# Patient Record
Sex: Male | Born: 1975 | Hispanic: Yes | Smoking: Former smoker
Health system: Southern US, Community
[De-identification: ages and names within clinical notes are randomized; demographics above are authoritative.]

## PROBLEM LIST (undated history)

## (undated) DIAGNOSIS — E7800 Pure hypercholesterolemia, unspecified: Secondary | ICD-10-CM

## (undated) DIAGNOSIS — I2510 Atherosclerotic heart disease of native coronary artery without angina pectoris: Secondary | ICD-10-CM

## (undated) DIAGNOSIS — I219 Acute myocardial infarction, unspecified: Secondary | ICD-10-CM

## (undated) DIAGNOSIS — E118 Type 2 diabetes mellitus with unspecified complications: Secondary | ICD-10-CM

## (undated) DIAGNOSIS — M609 Myositis, unspecified: Secondary | ICD-10-CM

## (undated) DIAGNOSIS — E785 Hyperlipidemia, unspecified: Secondary | ICD-10-CM

## (undated) DIAGNOSIS — Z9289 Personal history of other medical treatment: Secondary | ICD-10-CM

## (undated) DIAGNOSIS — N200 Calculus of kidney: Secondary | ICD-10-CM

## (undated) DIAGNOSIS — E119 Type 2 diabetes mellitus without complications: Secondary | ICD-10-CM

## (undated) HISTORY — DX: Hyperlipidemia, unspecified: E78.5

## (undated) HISTORY — DX: Myositis, unspecified: M60.9

## (undated) HISTORY — PX: CARDIAC CATHETERIZATION: SHX172

## (undated) HISTORY — PX: APPENDECTOMY: SHX54

## (undated) HISTORY — DX: Acute myocardial infarction, unspecified: I21.9

## (undated) HISTORY — DX: Type 2 diabetes mellitus without complications: E11.9

## (undated) HISTORY — DX: Calculus of kidney: N20.0

---
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
## 1998-08-15 HISTORY — PX: APPENDECTOMY: SHX54

## 2017-04-14 ENCOUNTER — Encounter: Payer: Self-pay | Admitting: Nurse Practitioner

## 2017-04-14 ENCOUNTER — Telehealth: Payer: Self-pay

## 2017-04-14 ENCOUNTER — Ambulatory Visit
Admission: RE | Admit: 2017-04-14 | Discharge: 2017-04-14 | Disposition: A | Discharge status: Home / Self Care | Payer: BLUE CROSS/BLUE SHIELD | Source: Ambulatory Visit | Attending: Nurse Practitioner | Admitting: Nurse Practitioner

## 2017-04-14 ENCOUNTER — Ambulatory Visit (INDEPENDENT_AMBULATORY_CARE_PROVIDER_SITE_OTHER): Payer: BLUE CROSS/BLUE SHIELD | Admitting: Nurse Practitioner

## 2017-04-14 VITALS — BP 129/81 | HR 80 | Temp 98.5°F | Ht 70.0 in | Wt 223.4 lb

## 2017-04-14 DIAGNOSIS — R079 Chest pain, unspecified: Secondary | ICD-10-CM

## 2017-04-14 DIAGNOSIS — R918 Other nonspecific abnormal finding of lung field: Secondary | ICD-10-CM | POA: Diagnosis not present

## 2017-04-14 DIAGNOSIS — Z131 Encounter for screening for diabetes mellitus: Secondary | ICD-10-CM

## 2017-04-14 DIAGNOSIS — R0602 Shortness of breath: Secondary | ICD-10-CM | POA: Diagnosis not present

## 2017-04-14 DIAGNOSIS — R234 Changes in skin texture: Secondary | ICD-10-CM

## 2017-04-14 DIAGNOSIS — L301 Dyshidrosis [pompholyx]: Secondary | ICD-10-CM | POA: Insufficient documentation

## 2017-04-14 LAB — CBC WITH DIFFERENTIAL/PLATELET
Basophils Absolute: 0 cells/uL (ref 0–200)
Basophils Relative: 0 %
Eosinophils Absolute: 0 cells/uL — ABNORMAL LOW (ref 15–500)
Eosinophils Relative: 0 %
HCT: 48.9 % (ref 38.5–50.0)
Hemoglobin: 16.6 g/dL (ref 13.2–17.1)
Lymphocytes Relative: 40 %
Lymphs Abs: 1800 cells/uL (ref 850–3900)
MCH: 29.9 pg (ref 27.0–33.0)
MCHC: 33.9 g/dL (ref 32.0–36.0)
MCV: 88.1 fL (ref 80.0–100.0)
MPV: 10.7 fL (ref 7.5–12.5)
Monocytes Absolute: 270 cells/uL (ref 200–950)
Monocytes Relative: 6 %
Neutro Abs: 2430 cells/uL (ref 1500–7800)
Neutrophils Relative %: 54 %
Platelets: 223 10*3/uL (ref 140–400)
RBC: 5.55 MIL/uL (ref 4.20–5.80)
RDW: 13.4 % (ref 11.0–15.0)
WBC: 4.5 10*3/uL (ref 3.8–10.8)

## 2017-04-14 NOTE — Progress Notes (Signed)
Subjective:    Patient ID: Stephen [name], male    DOB: 02-Sep-1975, 41 y.o.   MRN: [id number]  Stephen [name] is a 41 y.o. male presenting on 04/14/2017 for Establish Care (intermittent chest pain pt unsure if it was indigistion, because he was belching a lot when he developed the chest pain,  SOB with exertion x 2weeks ) and Rash (peeling on the hands x years that worsen over the last 10 mths.)   HPI Establish Care New Provider Pt last seen by PCP more than 20 years ago. Sought care because of chest pains and shortness of breath last a few weeks ago. Optometry: 3-4 years ago Dentist: regularly  Chest Pain Chest pain w/ shortness of breath during exertion occurring with less intensity several times - The last episode of chest pain was 3 weeks ago. Shortness of breath continued w/ several episodes after initial The last episode 2 weeks ago. - Has had burping w/ heartburn in past, but this pain was different.  Sharp pain noted. - Initial onset in Middle of July (approx July 19th) when he had chest pain while at work while walking.  He remembers date r/t multiple significant life stressors in recent weeks. - Since these events, he has noted lower exercise tolerance.  ie. was very tired w/ helping father build a fence.  Has refrained from heavy physical activity and has avoided going to gym because of the shortness of breath. 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  About 5,000 steps per day.  Denies leg swelling (bilat/unilateral), heart racing, palpitations, headache, loss of/changes in speech, loss of consciousness.  Rash Pt notes history over last 5-6 years of peeling skin on 2nd,3rd digits of left hand at fingertips.  Progressively spreading and now over all fingertips.  Will peel, stop and heal, then peel again cyclically.  Also notes "dermatitis" rash on top of arms at elbows only in winter.  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  Uses lotion w/o significant relief.   Past Medical History:  Diagnosis Date  . Kidney stones 2002, 2008   no stone eval   Past Surgical History:  Procedure Laterality Date  . APPENDECTOMY     Social History   Social History  . Marital status: Divorced    Spouse name: N/A  . Number of children: N/A  . Years of education: N/A   Occupational History  . Not on file.   Social History Main Topics  . Smoking status: Former Smoker    Types: Cigarettes    Quit date: 04/15/2015  . Smokeless tobacco: Never Used     Comment: weekend smoker, pt would only smoke bout 1 -2 cig on the weekend.   . Alcohol use 1.8 oz/week    3 Cans of beer per week     Comment: not every week  . Drug use: No  . Sexual activity: Yes     Comment: preventing w/ partner contraception   Other Topics Concern  . Not on file   Social History Narrative  . No narrative on file   Family History  Problem Relation Age of Onset  . Heart disease Mother   . Thyroid disease Mother   . CAD Mother 97  . Hypertension Mother   . Hyperlipidemia Mother   . Chronic Renal Failure Mother   . Cancer Father        pancreatic  s/p Whipple procedure  . Diabetes Father   . Cholecystitis Father   . Pancreatic cancer Father   . Hyperthyroidism Sister   . Lung cancer Paternal Aunt   . Diabetes Paternal Uncle   . Stroke Paternal Uncle   . Multiple sclerosis Sister   . Hyperthyroidism Sister   . Hyperthyroidism Sister   . Diabetes Sister   . Healthy Sister   . Healthy Son   . Healthy Son   . Pancreatic cancer Paternal Uncle   . Vision loss Neg Hx   . Heart attack Neg Hx   . Breast cancer Neg Hx   . Colon cancer Neg Hx   . Prostate cancer Neg Hx    No current outpatient prescriptions on file prior to visit.   No current facility-administered medications on file prior to visit.     Review of Systems  Constitutional: Positive for fatigue.  HENT: Negative.   Eyes:  Negative.   Respiratory: Positive for shortness of breath.   Cardiovascular: Positive for chest pain. Negative for palpitations and leg swelling.  Gastrointestinal: Negative.   Endocrine: Negative.   Genitourinary: Negative.   Musculoskeletal: Negative.   Skin: Positive for rash.  Allergic/Immunologic: Negative.   Neurological: Negative.   Hematological: Negative.   Psychiatric/Behavioral: Negative.    Per HPI unless specifically indicated above      Objective:    BP 129/81 (BP Location: Right Arm, Patient Position: Sitting, Cuff Size: Large)   Pulse 80   Temp 98.5 F (36.9 C) (Oral)   Ht 5\' 10"  (1.778 m)   Wt 223 lb 6.4 oz (101.3 kg)   SpO2 100%   BMI 32.05 kg/m   Wt Readings from Last 3 Encounters:  04/14/17 223 lb 6.4 oz (101.3 kg)    Physical Exam  General - obese, well-appearing, NAD HEENT - Normocephalic, atraumatic Neck - supple, non-tender, no LAD, no thyromegaly, no carotid bruit Heart - RRR, no murmurs heard Lungs - Clear throughout all lobes, no wheezing, crackles, or rhonchi. Normal work of breathing. Extremeties - non-tender, no edema, cap refill < 2 seconds, peripheral pulses intact +2 bilaterally Skin - warm, dry, rash present w/ peeling skin localized to fingertips bilaterally w/ underlying erythema. Neuro - awake, alert, oriented x3, normal gait Psych - Normal mood and affect, normal behavior       Assessment & Plan:   Problem List Items Addressed This Visit      Other   Chest pain - Primary    Pt w/ hx of recurrent chest pain approximately 3 weeks ago that has resolved in last 2 weeks.  No recent medical encounters.  Significant family history of CAD, hypertension, DMT2, thyroid disease.  Consider differential diagnoses: MI, PE, pleuritic chest pain, panic attack.  Plan: 1. Exercise stress test and Echocardiogram evaluate chest pain and shortness of breath. 2. Urgent Cardiology referral. 3. Reviewed emergencies.  Would prefer immediate workup  as inpatient since abnormal EKG, but pt refuses at this time.  Verbalizes understanding of risks of untreated CAD and will call 9-1-1 for EMS transport if has return of symptoms. 4. Chest Xray today. 5. Labs today: CBC, CMP, Lipid, TSH, A1c 6. EKG today w/ NSR wide QRS w/ changes in lead aVf, left axis deviation, inferior infarct age undetermined w/ possible anterior infarct.  No signs of WPW syndrome. 7. Follow up as needed and in 4 weeks.      Relevant Orders   Exercise Tolerance Test   ECHOCARDIOGRAM COMPLETE  Ambulatory referral to Cardiology   EKG 12-Lead   CBC with Differential/Platelet   Comprehensive metabolic panel   Lipid panel   DG Chest 2 View   TSH   Shortness of breath on exertion    See A/P chest pain      Relevant Orders   Exercise Tolerance Test   ECHOCARDIOGRAM COMPLETE   DG Chest 2 View   Peeling skin    Chronic condition characterized by cyclical peeling of skin on palmar surfaces of fingertips.  No scabbing, or cracking.  Consider hyperkeratosis, eczema, candidal infection.  Since also affects arms in winter, likely eczema.  Plan: 1. Apply hydrocortisone cream bid x 14 days.  If no relief, apply Lotrimin ointment bid x 14 days.  2. If still no improvement, will refer to dermatology. 3. Follow up 4 weeks or sooner if needed.       Other Visit Diagnoses    Screening for diabetes mellitus       Pt w/ family history of diabetes.   Relevant Orders   Hemoglobin A1c      No orders of the defined types were placed in this encounter.     Follow up plan: Return in about 4 weeks (around 05/12/2017) for Follow up shortness of breath and chest pain OR sooner if we need.  Cassell Smiles, DNP, AGPCNP-BC Adult Gerontology Primary Care Nurse Practitioner [facility] 04/14/2017, 1:56 PM

## 2017-04-14 NOTE — Assessment & Plan Note (Signed)
Chronic condition characterized by cyclical peeling of skin on palmar surfaces of fingertips.  No scabbing, or cracking.  Consider hyperkeratosis, eczema, candidal infection.  Since also affects arms in winter, likely eczema.  Plan: 1. Apply hydrocortisone cream bid x 14 days.  If no relief, apply Lotrimin ointment bid x 14 days.  2. If still no improvement, will refer to dermatology. 3. Follow up 4 weeks or sooner if needed.

## 2017-04-14 NOTE — Assessment & Plan Note (Addendum)
Pt w/ hx of recurrent chest pain approximately 3 weeks ago that has resolved in last 2 weeks.  No recent medical encounters.  Significant family history of CAD, hypertension, DMT2, thyroid disease.  Consider differential diagnoses: MI, PE, pleuritic chest pain, panic attack.  Plan: 1. Exercise stress test and Echocardiogram evaluate chest pain and shortness of breath. 2. Urgent Cardiology referral. 3. Reviewed emergencies.  Would prefer immediate workup as inpatient since abnormal EKG, but pt refuses at this time.  Verbalizes understanding of risks of untreated CAD and will call 9-1-1 for EMS transport if has return of symptoms. 4. Chest Xray today. 5. Labs today: CBC, CMP, Lipid, TSH, A1c 6. EKG today w/ NSR wide QRS w/ changes in lead aVf, left axis deviation, inferior infarct age undetermined w/ possible anterior infarct.  No signs of WPW syndrome. 7. Follow up as needed and in 4 weeks.

## 2017-04-14 NOTE — Assessment & Plan Note (Signed)
See A/P chest pain

## 2017-04-14 NOTE — Telephone Encounter (Signed)
New patient referral from pcp Cassell Smiles for Chest Patient marked Urgent Status.    Scheduled ordered echo ett and new patient appt with Arida on 11/01  Added to waitlist.

## 2017-04-14 NOTE — Progress Notes (Signed)
I have reviewed this encounter including the documentation in this note and/or discussed this patient with the provider, Cassell Smiles, AGPCNP-BC. I am certifying that I agree with the content of this note as supervising physician.  Nobie Putnam, [facility] 04/14/2017, 2:10 PM

## 2017-04-14 NOTE — Patient Instructions (Addendum)
[place], Thank you for coming in to clinic today.  1. Chest Xray today at [facility]  2. Labs today here.   3. IF ANY chest pain or shortness of breath this weekend.  Call 9-1-1 and go to Emergency Room via EMS.  - You will get a call to make your appointments.  4. For your rash: - Use lotrimin or clotrimazole ointment on your fingers twice daily x 14 days. - If no relief, use hydrocortisone cream twice daily x 14 days. - If still no relief, I will make a dermatology referral.  Call clinic to request.   Please schedule a follow-up appointment with Cassell Smiles, AGNP. Return in about 4 weeks (around 05/12/2017) for Follow up shortness of breath and chest pain OR sooner if we need.  If you have any other questions or concerns, please feel free to call the clinic or send a message through [facility]. You may also schedule an earlier appointment if necessary.  You will receive a survey after today's visit either digitally by e-mail or paper by [employer]. Your experiences and feedback matter to [place].  Please respond so we know how we are doing as we provide care for you.   Cassell Smiles, DNP, AGNP-BC Adult Gerontology Nurse Practitioner [facility]

## 2017-04-15 LAB — COMPREHENSIVE METABOLIC PANEL
ALT: 38 U/L (ref 9–46)
AST: 24 U/L (ref 10–40)
Albumin: 4.8 g/dL (ref 3.6–5.1)
Alkaline Phosphatase: 85 U/L (ref 40–115)
BUN: 13 mg/dL (ref 7–25)
CO2: 21 mmol/L (ref 20–32)
Calcium: 9.8 mg/dL (ref 8.6–10.3)
Chloride: 101 mmol/L (ref 98–110)
Creat: 0.81 mg/dL (ref 0.60–1.35)
Glucose, Bld: 227 mg/dL — ABNORMAL HIGH (ref 65–99)
Potassium: 4.2 mmol/L (ref 3.5–5.3)
Sodium: 138 mmol/L (ref 135–146)
Total Bilirubin: 0.9 mg/dL (ref 0.2–1.2)
Total Protein: 7.4 g/dL (ref 6.1–8.1)

## 2017-04-15 LAB — LIPID PANEL
Cholesterol: 219 mg/dL — ABNORMAL HIGH (ref <200)
HDL: 37 mg/dL — ABNORMAL LOW (ref >40)
LDL Cholesterol: 126 mg/dL — ABNORMAL HIGH (ref <100)
Total CHOL/HDL Ratio: 5.9 Ratio — ABNORMAL HIGH (ref <5.0)
Triglycerides: 278 mg/dL — ABNORMAL HIGH (ref <150)
VLDL: 56 mg/dL — ABNORMAL HIGH (ref <30)

## 2017-04-15 LAB — TSH: TSH: 1.87 mIU/L (ref 0.40–4.50)

## 2017-04-15 LAB — HEMOGLOBIN A1C
Hgb A1c MFr Bld: 10.2 % — ABNORMAL HIGH (ref <5.7)
Mean Plasma Glucose: 246 mg/dL

## 2017-04-18 ENCOUNTER — Other Ambulatory Visit: Payer: Self-pay

## 2017-04-18 ENCOUNTER — Ambulatory Visit (INDEPENDENT_AMBULATORY_CARE_PROVIDER_SITE_OTHER): Payer: BLUE CROSS/BLUE SHIELD

## 2017-04-18 ENCOUNTER — Encounter: Payer: Self-pay | Admitting: Cardiovascular Disease

## 2017-04-18 ENCOUNTER — Other Ambulatory Visit: Payer: Self-pay | Admitting: Nurse Practitioner

## 2017-04-18 ENCOUNTER — Ambulatory Visit (INDEPENDENT_AMBULATORY_CARE_PROVIDER_SITE_OTHER): Payer: BLUE CROSS/BLUE SHIELD | Admitting: Cardiovascular Disease

## 2017-04-18 VITALS — BP 120/80 | HR 78 | Ht 70.0 in | Wt 224.0 lb

## 2017-04-18 DIAGNOSIS — J181 Lobar pneumonia, unspecified organism: Principal | ICD-10-CM

## 2017-04-18 DIAGNOSIS — R079 Chest pain, unspecified: Secondary | ICD-10-CM

## 2017-04-18 DIAGNOSIS — J189 Pneumonia, unspecified organism: Secondary | ICD-10-CM

## 2017-04-18 DIAGNOSIS — E785 Hyperlipidemia, unspecified: Secondary | ICD-10-CM | POA: Diagnosis not present

## 2017-04-18 DIAGNOSIS — R0602 Shortness of breath: Secondary | ICD-10-CM

## 2017-04-18 DIAGNOSIS — R9431 Abnormal electrocardiogram [ECG] [EKG]: Secondary | ICD-10-CM | POA: Diagnosis not present

## 2017-04-18 LAB — ECHOCARDIOGRAM COMPLETE
Height: 70 in
Weight: 3584 oz

## 2017-04-18 MED ORDER — AZITHROMYCIN 250 MG PO TABS: ORAL_TABLET | ORAL | 0 refills | Status: DC

## 2017-04-18 NOTE — Telephone Encounter (Signed)
Stephen [name]. S/w pt who is agreeable to 1:40pm appt today with Dr. Fletcher Anon.

## 2017-04-18 NOTE — Patient Instructions (Addendum)
Medication Instructions:  Your physician recommends that you continue on your current medications as directed. Please refer to the Current Medication list given to you today.   Labwork: none  Testing/Procedures:  We have cancelled your in-office treadmill test.   Your physician has requested that you have a lexiscan myoview. For further information please visit [email or web address]. Please follow instruction sheet, as given.  [place]  Your caregiver has ordered a Stress Test with nuclear imaging. The purpose of this test is to evaluate the blood supply to your heart muscle. This procedure is referred to as a "Non-Invasive Stress Test." This is because other than having an IV started in your vein, nothing is inserted or "invades" your body. Cardiac stress tests are done to find areas of poor blood flow to the heart by determining the extent of coronary artery disease (CAD). Some patients exercise on a treadmill, which naturally increases the blood flow to your heart, while others who are  unable to walk on a treadmill due to physical limitations have a pharmacologic/chemical stress agent called Lexiscan . This medicine will mimic walking on a treadmill by temporarily increasing your coronary blood flow.   Please note: these test may take anywhere between 2-4 hours to complete  PLEASE REPORT TO [facility] AT THE FIRST DESK WILL DIRECT YOU WHERE TO GO  Date of Procedure:___Wednesday, September 5___  Arrival Time for Procedure:___8:15am________  You may take your morning medication with a sip of water.    PLEASE NOTIFY THE OFFICE AT LEAST 75 HOURS IN ADVANCE IF YOU ARE UNABLE TO KEEP YOUR APPOINTMENT.  [phone] AND  PLEASE NOTIFY NUCLEAR MEDICINE AT [facility] AT LEAST 24 HOURS IN ADVANCE IF YOU ARE UNABLE TO KEEP YOUR APPOINTMENT. [phone]  How to prepare for your Myoview test:  1. Do not eat or drink after midnight 2. No caffeine for 24 hours  prior to test 3. No smoking 24 hours prior to test. 4. Your medication may be taken with water.  If your doctor stopped a medication because of this test, do not take that medication. 5. Ladies, please do not wear dresses.  Skirts or pants are appropriate. Please wear a short sleeve shirt. 6. No perfume, cologne or lotion. 7. Wear comfortable walking shoes. No heels!            Follow-Up: Your physician recommends that you schedule a follow-up appointment with Dr. Fletcher Anon after testing is complete.    Any Other Special Instructions Will Be Listed Below (If Applicable).     If you need a refill on your cardiac medications before your next appointment, please call your pharmacy.  Cardiac Nuclear Scan A cardiac nuclear scan is a test that measures blood flow to the heart when a person is resting and when he or she is exercising. The test looks for problems such as:  Not enough blood reaching a portion of the heart.  The heart muscle not working normally.  You may need this test if:  You have heart disease.  You have had abnormal lab results.  You have had heart surgery or angioplasty.  You have chest pain.  You have shortness of breath.  In this test, a radioactive dye (tracer) is injected into your bloodstream. After the tracer has traveled to your heart, an imaging device is used to measure how much of the tracer is absorbed by or distributed to various areas of your heart. This procedure is usually done at a hospital  and takes 2-4 hours. Tell a health care provider about:  Any allergies you have.  All medicines you are taking, including vitamins, herbs, eye drops, creams, and over-the-counter medicines.  Any problems you or family members have had with the use of anesthetic medicines.  Any blood disorders you have.  Any surgeries you have had.  Any medical conditions you have.  Whether you are pregnant or may be pregnant. What are the risks? Generally, this  is a safe procedure. However, problems may occur, including:  Serious chest pain and heart attack. This is only a risk if the stress portion of the test is done.  Rapid heartbeat.  Sensation of warmth in your chest. This usually passes quickly.  What happens before the procedure?  Ask your health care provider about changing or stopping your regular medicines. This is especially important if you are taking diabetes medicines or blood thinners.  Remove your jewelry on the day of the procedure. What happens during the procedure?  An IV tube will be inserted into one of your veins.  Your health care provider will inject a small amount of radioactive tracer through the tube.  You will wait for 20-40 minutes while the tracer travels through your bloodstream.  Your heart activity will be monitored with an electrocardiogram (ECG).  You will lie down on an exam table.  Images of your heart will be taken for about 15-20 minutes.  You may be asked to exercise on a treadmill or stationary bike. While you exercise, your heart's activity will be monitored with an ECG, and your blood pressure will be checked. If you are unable to exercise, you may be given a medicine to increase blood flow to parts of your heart.  When blood flow to your heart has peaked, a tracer will again be injected through the IV tube.  After 20-40 minutes, you will get back on the exam table and have more images taken of your heart.  When the procedure is over, your IV tube will be removed. The procedure may vary among health care providers and hospitals. Depending on the type of tracer used, scans may need to be repeated 3-4 hours later. What happens after the procedure?  Unless your health care provider tells you otherwise, you may return to your normal schedule, including diet, activities, and medicines.  Unless your health care provider tells you otherwise, you may increase your fluid intake. This will help flush  the contrast dye from your body. Drink enough fluid to keep your urine clear or pale yellow.  It is up to you to get your test results. Ask your health care provider, or the department that is doing the test, when your results will be ready. Summary  A cardiac nuclear scan measures the blood flow to the heart when a person is resting and when he or she is exercising.  You may need this test if you are at risk for heart disease.  Tell your health care provider if you are pregnant.  Unless your health care provider tells you otherwise, increase your fluid intake. This will help flush the contrast dye from your body. Drink enough fluid to keep your urine clear or pale yellow. This information is not intended to replace advice given to you by your health care provider. Make sure you discuss any questions you have with your health care provider. Document Released: 08/26/2004 Document Revised: 08/03/2016 Document Reviewed: 07/10/2013 Elsevier Interactive Patient Education  2017 [employer].

## 2017-04-18 NOTE — Progress Notes (Signed)
Cardiology Office Note   Date:  04/18/2017   ID:  Stephen [name], DOB [birth year], MRN [id number]  PCP:  Mikey College, NP  Cardiologist:   Kathlyn Sacramento, MD   Chief Complaint  Patient presents with  . other    Ref by Dr. Merrilyn Puma for chest pain with having an ETT & Echo that is scheduled 1 week from today. Pt. c/o shortness of breath and chest pain.        History of Present Illness: Stephen [name] is a 41 y.o. male who Was referred by Cassell Smiles for evaluation of chest pain. He has no prior cardiac history and was not seen by a physician for more than 20 years up until recently. He presented for evaluation of chest pain. He reports a prolonged episode of chest pain about 4 weeks ago which was described as sharp and heartburn feeling followed by shortness of breath. The episode of chest pain lasted for a few hours. He had a similar episode a few days later and none since then. However, he has experienced worsening exertional dyspnea since that time with fatigue. No orthopnea, PND or leg edema. He does not have any chronic medical conditions.. However, he had routine labs done recently and he was diagnosed with type 2 diabetes with hemoglobin A1c of 10.2. His lipid profile was also elevated. He quit smoking about 25 years ago. He drinks alcohol occasionally on weekends. He does not have family history of premature coronary artery disease. His mother did have CABG in her 47s.    Past Medical History:  Diagnosis Date  . Kidney stones 2002, 2008   no stone eval    Past Surgical History:  Procedure Laterality Date  . APPENDECTOMY       Current Outpatient Prescriptions  Medication Sig Dispense Refill  . azithromycin (ZITHROMAX Z-PAK) 250 MG tablet Take 1 tablet twice today.  Then, take 1 tablet once daily. 6 each 0   No current facility-administered medications for this visit.     Allergies:   Patient has no known allergies.    Social History:  The patient  reports  that he quit smoking about 2 years ago. His smoking use included Cigarettes. He has never used smokeless tobacco. He reports that he drinks about 1.8 oz of alcohol per week . He reports that he does not use drugs.   Family History:  [name] family history includes CAD (age of onset: 36) in his mother; Cancer in his father; Cholecystitis in his father; Chronic Renal Failure in his mother; Diabetes in his father, paternal uncle, and sister; Healthy in his sister, son, and son; Heart disease in his mother; Hyperlipidemia in his mother; Hypertension in his mother; Hyperthyroidism in his sister, sister, and sister; Lung cancer in his paternal aunt; Multiple sclerosis in his sister; Pancreatic cancer in his father and paternal uncle; Stroke in his paternal uncle; Thyroid disease in his mother.    ROS:  Please see the history of present illness.   Otherwise, review of systems are positive for none.   All other systems are reviewed and negative.    PHYSICAL EXAM: VS:  BP 120/80 (BP Location: Right Arm, Patient Position: Sitting, Cuff Size: Normal)   Pulse 78   Ht 5\' 10"  (1.778 m)   Wt 224 lb (101.6 kg)   BMI 32.14 kg/m  , BMI Body mass index is 32.14 kg/m. GEN: Well nourished, well developed, in no acute distress  HEENT: normal  Neck: no  JVD, carotid bruits, or masses Cardiac: RRR; no murmurs, rubs, or gallops,no edema  Respiratory:  clear to auscultation bilaterally, normal work of breathing GI: soft, nontender, nondistended, + BS MS: no deformity or atrophy  Skin: warm and dry, no rash Neuro:  Strength and sensation are intact Psych: euthymic mood, full affect   EKG:  EKG is ordered today. 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 No  Previous surgical procedure? No  Pain with walking? No  Feet/toe relief with dangling? No  Painful, non-healing ulcers? No  Extremities discolored? No      ASSESSMENT AND PLAN:  1.  Chest pain and exertional dyspnea: He had a prolonged episode of chest pain about 4 weeks ago. 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 He was also recently diagnosed with type 2 diabetes with hemoglobin A1c above 10 with elevated LDL. Due to this, I recommend urgent evaluation with a treadmill nuclear stress test. Given his abnormal EKG with left axis deviation and inferior infarct, a treadmill stress test alone is probably not going to be diagnostic.  He is already scheduled for an echocardiogram next week for evaluation of dyspnea.  2. Hyperlipidemia: Recommend treatment with a statin given newly diagnosed diabetes.    Disposition:   FU with me based on test results.   Signed,  Kathlyn Sacramento, MD  04/18/2017 1:47 PM    [facility]

## 2017-04-19 ENCOUNTER — Ambulatory Visit
Admission: RE | Admit: 2017-04-19 | Discharge: 2017-04-19 | Disposition: A | Discharge status: Home / Self Care | Payer: BLUE CROSS/BLUE SHIELD | Source: Ambulatory Visit | Attending: Cardiovascular Disease | Admitting: Cardiovascular Disease

## 2017-04-19 DIAGNOSIS — R9431 Abnormal electrocardiogram [ECG] [EKG]: Secondary | ICD-10-CM | POA: Diagnosis not present

## 2017-04-19 DIAGNOSIS — R079 Chest pain, unspecified: Secondary | ICD-10-CM | POA: Insufficient documentation

## 2017-04-19 LAB — NM MYOCAR MULTI W/SPECT W/WALL MOTION / EF
CHL CUP NUCLEAR SSS: 13
CSEPEDS: 30 s
Estimated workload: 12.5 METS
Exercise duration (min): 10 min
LVDIAVOL: 60 mL (ref 62–150)
LVSYSVOL: 29 mL
NUC STRESS TID: 0.58
Peak HR: 171 {beats}/min
Percent HR: 95 %
Rest HR: 75 {beats}/min
SDS: 12
SRS: 1

## 2017-04-19 MED ORDER — TECHNETIUM TC 99M TETROFOSMIN IV KIT
30.0000 | PACK | Freq: Once | INTRAVENOUS | Status: AC | PRN
  Administered 2017-04-19: 29.38 via INTRAVENOUS

## 2017-04-19 MED ORDER — TECHNETIUM TC 99M TETROFOSMIN IV KIT
12.0000 | PACK | Freq: Once | INTRAVENOUS | Status: AC | PRN
  Administered 2017-04-19: 12.85 via INTRAVENOUS

## 2017-04-20 ENCOUNTER — Other Ambulatory Visit: Payer: Self-pay

## 2017-04-20 ENCOUNTER — Encounter: Payer: Self-pay | Admitting: Nurse Practitioner

## 2017-04-20 ENCOUNTER — Ambulatory Visit (INDEPENDENT_AMBULATORY_CARE_PROVIDER_SITE_OTHER): Payer: BLUE CROSS/BLUE SHIELD | Admitting: Nurse Practitioner

## 2017-04-20 ENCOUNTER — Other Ambulatory Visit
Admission: RE | Admit: 2017-04-20 | Discharge: 2017-04-20 | Disposition: A | Discharge status: Home / Self Care | Payer: BLUE CROSS/BLUE SHIELD | Source: Ambulatory Visit | Attending: Cardiovascular Disease | Admitting: Cardiovascular Disease

## 2017-04-20 ENCOUNTER — Other Ambulatory Visit: Payer: Self-pay | Admitting: Nurse Practitioner

## 2017-04-20 VITALS — BP 112/77 | HR 74 | Temp 98.4°F | Ht 70.0 in | Wt 219.8 lb

## 2017-04-20 DIAGNOSIS — Z01812 Encounter for preprocedural laboratory examination: Secondary | ICD-10-CM | POA: Insufficient documentation

## 2017-04-20 DIAGNOSIS — E1169 Type 2 diabetes mellitus with other specified complication: Secondary | ICD-10-CM | POA: Insufficient documentation

## 2017-04-20 DIAGNOSIS — E1129 Type 2 diabetes mellitus with other diabetic kidney complication: Secondary | ICD-10-CM | POA: Insufficient documentation

## 2017-04-20 DIAGNOSIS — E119 Type 2 diabetes mellitus without complications: Secondary | ICD-10-CM | POA: Diagnosis not present

## 2017-04-20 DIAGNOSIS — R809 Proteinuria, unspecified: Secondary | ICD-10-CM

## 2017-04-20 DIAGNOSIS — E782 Mixed hyperlipidemia: Secondary | ICD-10-CM | POA: Diagnosis not present

## 2017-04-20 LAB — PROTIME-INR
INR: 0.95
Prothrombin Time: 12.6 seconds (ref 11.4–15.2)

## 2017-04-20 LAB — POCT CBG (FASTING - GLUCOSE)-MANUAL ENTRY: Glucose Fasting, POC: 192 mg/dL — AB (ref 70–99)

## 2017-04-20 MED ORDER — CLOPIDOGREL BISULFATE 75 MG PO TABS: 75.0000 mg | ORAL_TABLET | Freq: Every day | ORAL | 3 refills | Status: DC

## 2017-04-20 MED ORDER — ATORVASTATIN CALCIUM 40 MG PO TABS: 40.0000 mg | ORAL_TABLET | Freq: Every day | ORAL | 3 refills | Status: DC

## 2017-04-20 MED ORDER — METFORMIN HCL 500 MG PO TABS: 500.0000 mg | ORAL_TABLET | Freq: Two times a day (BID) | ORAL | 5 refills | Status: DC

## 2017-04-20 MED ORDER — ICOSAPENT ETHYL 1 G PO CAPS: 2.0000 g | ORAL_CAPSULE | Freq: Two times a day (BID) | ORAL | 3 refills | Status: DC

## 2017-04-20 MED ORDER — BLOOD GLUCOSE MONITOR KIT: PACK | 0 refills | Status: DC

## 2017-04-20 MED ORDER — SIMVASTATIN 40 MG PO TABS: 40.0000 mg | ORAL_TABLET | Freq: Every day | ORAL | 5 refills | Status: DC

## 2017-04-20 MED ORDER — ASPIRIN EC 81 MG PO TBEC: 81.0000 mg | DELAYED_RELEASE_TABLET | Freq: Every day | ORAL | 3 refills | Status: AC

## 2017-04-20 NOTE — Progress Notes (Signed)
 Subjective:    Patient ID: Stephen [name], male    DOB: 10/16/1975, 41 y.o.   MRN: [id number]  Stephen [name] is a 41 y.o. male presenting on 04/20/2017 for Diabetes (discuss lab results )   HPI Diabetes Pt presents today for initial management of Type 2 diabetes mellitus. He has not had symptoms and was initially recognized on screening after pt presented to clinic 8/31 w/ history of chest pain and probable heart attack about 3-4 weeks ago.  Cardiology workup ongoing.  He does not have a meter and is not currently on any medications. He has great support from his girlfriend and boss who has diabetes and has had a heart attack.  His boss has already helped pt w/ initial self-management.    He  has symptoms of polydipsia. He denies polyphagia, polyuria, headaches, diaphoresis, shakiness, chills, pain, numbness or tingling in extremities and changes in vision.    He  reports no regular exercise routine. His diet is not currently restricted and is generally high in carbohydrates.  He was eating 50 g carb instant oatmeal for breakfast, but already states he has thrown those away and he will need to make different choices.  Weight trend: stable Pt reports previously weighed 279 lbs, but is now around 220 after diet and exercise.  PREVENTION: Eye exam current (within one year): no Foot exam current (within one year): no Lipid/ASCVD risk reduction - on statin: no - starting today Kidney protection - on ace or arb: no (BP normal)  Recent Labs  04/14/17 1137  HGBA1C 10.2*    Social History  Substance Use Topics  . Smoking status: Former Smoker    Types: Cigarettes    Quit date: 04/15/2015  . Smokeless tobacco: Never Used     Comment: weekend smoker, pt would only smoke bout 1 -2 cig on the weekend.   . Alcohol use 1.8 oz/week    3 Cans of beer per week     Comment: not every week    Review of Systems Per HPI unless specifically indicated above     Objective:    BP 112/77 (BP  Location: Right Arm, Patient Position: Sitting, Cuff Size: Large)   Pulse 74   Temp 98.4 F (36.9 C) (Oral)   Ht 5' 10" (1.778 m)   Wt 219 lb 12.8 oz (99.7 kg)   BMI 31.54 kg/m   Wt Readings from Last 3 Encounters:  04/20/17 219 lb 12.8 oz (99.7 kg)  04/18/17 224 lb (101.6 kg)  04/14/17 223 lb 6.4 oz (101.3 kg)    Physical Exam  General - overweight, well-appearing, NAD HEENT - Normocephalic, atraumatic Neck - supple, non-tender, no LAD, no thyromegaly, no carotid bruit Heart - RRR, no murmurs heard Lungs - Clear throughout all lobes, no wheezing, crackles, or rhonchi. Normal work of breathing. Extremeties - non-tender, no edema, cap refill < 2 seconds, peripheral pulses intact +2 bilaterally Skin - warm, dry Neuro - awake, alert, oriented x3, normal gait Psych - Normal mood and affect, normal behavior.  Asks appropriate questions.  Exhibits adequate to excellent health literacy.     Results for orders placed or performed during the hospital encounter of 04/19/17  NM Myocar Multi W/Spect W/Wall Motion / EF  Result Value Ref Range   Rest HR 75 bpm   Rest BP 117/79 mmHg   Exercise duration (sec) 30 sec   Percent HR 95 %   Exercise duration (min) 10 min   Estimated workload 12.5 METS     Peak HR 171 bpm   Peak BP 138/88 mmHg   SSS 13    SRS 1    SDS 12    TID 0.58    LV sys vol 29 mL   LV dias vol 60 62 - 150 mL      Assessment & Plan:   Problem List Items Addressed This Visit      Endocrine   Type 2 diabetes mellitus without complication, without long-term current use of insulin (HCC) - Primary    New diagnosis not currently treated and largely asymptomatic.  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  Screening A1c 04/14/17: 10.2%  Plan: 1. Diabetes education - referral placed for formal education. Basic information provided today. 2. START home CBG check for fasting am blood sugar. Goal less than 120, higher than 70. - Reviewed step-by-step  teaching in clinic today. - POCT CBG: fasting am 192. 3. Reviewed low glycemic diet.  Reviewed goal carbohydrates 30-45g per meal and 15g per snack for total between 100-120g per day. 4. START metformin 500 mg once daily.  Increase to 500 mg twice daily w/ meals after 2 weeks.  Reviewed common side effects of GI disturbance. 5. Follow up 4 weeks as previously scheduled.      Relevant Medications   blood glucose meter kit and supplies KIT   metFORMIN (GLUCOPHAGE) 500 MG tablet   simvastatin (ZOCOR) 40 MG tablet   Other Relevant Orders   Referral to Nutrition and Diabetes Services   POCT CBG (Fasting - Glucose)     Other   Mixed hyperlipidemia    New diagnosis not currently treated.   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   - Pt w/ new diagnosis DMT2.  Plan: 1. Encouraged improving diet and exercise.  Reviewed diet. Encouraged low glycemic higher omega-3, lower saturated fat. 2. START fish oil - opt for prescription Vascepa 4 g per day for improving triglycerides (w/ DMT2 management), HDL.  Consider reducing to 2g per day or transitioning to OTC fish oil supplement. 3. START simvastatin 40 mg once daily.  Initiate high intensity statin w/ option to increase to simvastatin 80 mg once daily. 4. Follow up 3 months.      Relevant Medications   simvastatin (ZOCOR) 40 MG tablet   Icosapent Ethyl (VASCEPA) 1 g CAPS      Meds ordered this encounter  Medications  . blood glucose meter kit and supplies KIT    Sig: Dispense based on patient and insurance preference. Use up to four times daily as directed. (FOR ICD-9 250.00, 250.01).    Dispense:  1 each    Refill:  0    Order Specific Question:   Number of strips    Answer:   100    Order Specific Question:   Number of lancets    Answer:   100  . metFORMIN (GLUCOPHAGE) 500 MG tablet    Sig: Take 1 tablet (500 mg total) by mouth 2 (two) times daily with a meal.    Dispense:  60  tablet    Refill:  5  . simvastatin (ZOCOR) 40 MG tablet    Sig: Take 1 tablet (40 mg total) by mouth at bedtime.    Dispense:  30 tablet    Refill:  5  . Icosapent Ethyl (VASCEPA) 1 g CAPS    Sig: Take 2 g by mouth 2 (two) times daily.  Dispense:  360 capsule    Refill:  3      Follow up plan: Return in about 4 weeks (around 05/18/2017) for as scheduled for your diabetes.  A total of 50 minutes was spent face-to-face with this patient. Greater than 50% of this time was spent in counseling and coordination of care with the patient regarding diabetes disease, self care (diet, fingerstick CBGs, metformin), hyperlipidemia disease and medications, heart attack diagnostics and upcoming cardiac catheterization.   Lauren Kennedy, DNP, AGPCNP-BC Adult Gerontology Primary Care Nurse Practitioner [facility] 04/20/2017, 8:21 AM 

## 2017-04-20 NOTE — Progress Notes (Signed)
I have reviewed this encounter including the documentation in this note and/or discussed this patient with the provider, Cassell Smiles, AGPCNP-BC. I am certifying that I agree with the content of this note as supervising physician.  Nobie Putnam, [facility] 04/20/2017, 1:44 PM

## 2017-04-20 NOTE — Patient Instructions (Addendum)
Stephen [name], Thank you for coming in to clinic today.  1. For your blood sugar: - Obtain your meter.  If you need instruction, come by the clinic and we can show you. - Check morning fasting blood sugar every day.  - START taking metformin 500mg  tablet once daily for 2 weeks.  Then, increase to one tablet twice daily and continue. - Work on your low glycemic diet. - Diabetes Education - they will call you to schedule. - Opthalmology appointment: yearly retinal exam  Try to limit to 30-45 grams carbohydrates in a single meal or 15 grams for snacks.  2. For your cholesterol: - START simvastatin 40 mg once daily with dinner. - START vascepa 2 g (two tablets) in am for breakfast, and 2 tablets with dinner. - Low glycemic diet - Increase exercise to 30 minutes most days of the week.  Please schedule a follow-up appointment with Stephen [name], AGNP. Return in about 4 weeks (around 05/18/2017) for as scheduled for your diabetes.  If you have any other questions or concerns, please feel free to call the clinic or send a message through [place]. You may also schedule an earlier appointment if necessary.  You will receive a survey after today's visit either digitally by e-mail or paper by [employer]. Your experiences and feedback matter to Stephen [name].  Please respond so we know how we are doing as we provide care for you.   Blood Glucose Monitoring, Adult Monitoring your blood sugar (glucose) helps you manage your diabetes. It also helps you and your health care provider determine how well your diabetes management plan is working. Blood glucose monitoring involves checking your blood glucose as often as directed, and keeping a record (log) of your results over time. Why should I monitor my blood glucose? Checking your blood glucose regularly can:  Help you understand how food, exercise, illnesses, and medicines affect your blood glucose.  Let you know what your blood glucose is at any time. You can quickly  tell if you are having low blood glucose (hypoglycemia) or high blood glucose (hyperglycemia).  Help you and your health care provider adjust your medicines as needed.  When should I check my blood glucose? Follow instructions from your health care provider about how often to check your blood glucose. This may depend on:  The type of diabetes you have.  How well-controlled your diabetes is.  Medicines you are taking.  If you have type 1 diabetes:  Check your blood glucose at least 2 times a day.  Also check your blood glucose: ? Before every insulin injection. ? Before and after exercise. ? Between meals. ? 2 hours after a meal. ? Occasionally between 2:00 a.m. and 3:00 a.m., as directed. ? Before potentially dangerous tasks, like driving or using heavy machinery. ? At bedtime.  You may need to check your blood glucose more often, up to 6-10 times a day: ? If you use an insulin pump. ? If you need multiple daily injections (MDI). ? If your diabetes is not well-controlled. ? If you are ill. ? If you have a history of severe hypoglycemia. ? If you have a history of not knowing when your blood glucose is getting low (hypoglycemia unawareness). If you have type 2 diabetes:  If you take insulin or other diabetes medicines, check your blood glucose at least 2 times a day.  If you are on intensive insulin therapy, check your blood glucose at least 4 times a day. Occasionally, you may also need  to check between 2:00 a.m. and 3:00 a.m., as directed.  Also check your blood glucose: ? Before and after exercise. ? Before potentially dangerous tasks, like driving or using heavy machinery.  You may need to check your blood glucose more often if: ? Your medicine is being adjusted. ? Your diabetes is not well-controlled. ? You are ill. What is a blood glucose log?  A blood glucose log is a record of your blood glucose readings. It helps you and your health care provider: ? Look for  patterns in your blood glucose over time. ? Adjust your diabetes management plan as needed.  Every time you check your blood glucose, write down your result and notes about things that may be affecting your blood glucose, such as your diet and exercise for the day.  Most glucose meters store a record of glucose readings in the meter. Some meters allow you to download your records to a computer. How do I check my blood glucose? Follow these steps to get accurate readings of your blood glucose: Supplies needed   Blood glucose meter.  Test strips for your meter. Each meter has its own strips. You must use the strips that come with your meter.  A needle to prick your finger (lancet). Do not use lancets more than once.  A device that holds the lancet (lancing device).  A journal or log book to write down your results. Procedure  Wash your hands with soap and water.  Prick the side of your finger (not the tip) with the lancet. Use a different finger each time.  Gently rub the finger until a small drop of blood appears.  Follow instructions that come with your meter for inserting the test strip, applying blood to the strip, and using your blood glucose meter.  Write down your result and any notes. Alternative testing sites  Some meters allow you to use areas of your body other than your finger (alternative sites) to test your blood.  If you think you may have hypoglycemia, or if you have hypoglycemia unawareness, do not use alternative sites. Use your finger instead.  Alternative sites may not be as accurate as the fingers, because blood flow is slower in these areas. This means that the result you get may be delayed, and it may be different from the result that you would get from your finger.  The most common alternative sites are: ? Forearm. ? Thigh. ? Palm of the hand. Additional tips  Always keep your supplies with you.  If you have questions or need help, all blood  glucose meters have a 24-hour "hotline" number that you can call. You may also contact your health care provider.  After you use a few boxes of test strips, adjust (calibrate) your blood glucose meter by following instructions that came with your meter. This information is not intended to replace advice given to you by your health care provider. Make sure you discuss any questions you have with your health care provider. Document Released: 08/04/2003 Document Revised: 02/19/2016 Document Reviewed: 01/11/2016 Elsevier Interactive Patient Education  2017 [place], DNP, AGNP-BC Adult Gerontology Nurse Practitioner [facility]

## 2017-04-20 NOTE — Progress Notes (Signed)
STOP simvastatin.  START atorvastatin and plavix as directed by your cardiologist.

## 2017-04-20 NOTE — Assessment & Plan Note (Signed)
New diagnosis not currently treated and largely asymptomatic.  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  Screening A1c 04/14/17: 10.2%  Plan: 1. Diabetes education - referral placed for formal education. Basic information provided today. 2. START home CBG check for fasting am blood sugar. Goal less than 120, higher than 70. - Reviewed step-by-step teaching in clinic today. - POCT CBG: fasting am 192. 3. Reviewed low glycemic diet.  Reviewed goal carbohydrates 30-45g per meal and 15g per snack for total between 100-120g per day. 4. START metformin 500 mg once daily.  Increase to 500 mg twice daily w/ meals after 2 weeks.  Reviewed common side effects of GI disturbance. 5. Follow up 4 weeks as previously scheduled.

## 2017-04-20 NOTE — Assessment & Plan Note (Signed)
New diagnosis not currently treated.   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   - Pt w/ new diagnosis DMT2.  Plan: 1. Encouraged improving diet and exercise.  Reviewed diet. Encouraged low glycemic higher omega-3, lower saturated fat. 2. START fish oil - opt for prescription Vascepa 4 g per day for improving triglycerides (w/ DMT2 management), HDL.  Consider reducing to 2g per day or transitioning to OTC fish oil supplement. 3. START simvastatin 40 mg once daily.  Initiate high intensity statin w/ option to increase to simvastatin 80 mg once daily. 4. Follow up 3 months.

## 2017-04-21 NOTE — Progress Notes (Signed)
Pt was seen in office yesterday (9/6). Informed pt that he would need to start medications aspirin, plavix, atorvastatin per your orders.  I also started him on T2DM treatment w/ metformin, lifestyle modification.  Informed that he had abnormal stress test and was scheduled for Cath on 9/10.  Pt verbalized understanding.

## 2017-04-24 ENCOUNTER — Encounter
Admission: RE | Disposition: A | Discharge status: Home / Self Care | Payer: Self-pay | Source: Ambulatory Visit | Attending: Cardiovascular Disease

## 2017-04-24 ENCOUNTER — Ambulatory Visit
Admission: RE | Admit: 2017-04-24 | Discharge: 2017-04-24 | Disposition: A | Discharge status: Home / Self Care | Payer: BLUE CROSS/BLUE SHIELD | Source: Ambulatory Visit | Attending: Cardiovascular Disease | Admitting: Cardiovascular Disease

## 2017-04-24 ENCOUNTER — Encounter: Payer: Self-pay | Admitting: *Deleted

## 2017-04-24 DIAGNOSIS — Z87891 Personal history of nicotine dependence: Secondary | ICD-10-CM | POA: Diagnosis not present

## 2017-04-24 DIAGNOSIS — R079 Chest pain, unspecified: Secondary | ICD-10-CM | POA: Diagnosis not present

## 2017-04-24 DIAGNOSIS — I2582 Chronic total occlusion of coronary artery: Secondary | ICD-10-CM | POA: Diagnosis not present

## 2017-04-24 DIAGNOSIS — E785 Hyperlipidemia, unspecified: Secondary | ICD-10-CM | POA: Insufficient documentation

## 2017-04-24 DIAGNOSIS — E119 Type 2 diabetes mellitus without complications: Secondary | ICD-10-CM | POA: Insufficient documentation

## 2017-04-24 DIAGNOSIS — R9439 Abnormal result of other cardiovascular function study: Secondary | ICD-10-CM | POA: Diagnosis present

## 2017-04-24 DIAGNOSIS — I2510 Atherosclerotic heart disease of native coronary artery without angina pectoris: Secondary | ICD-10-CM | POA: Insufficient documentation

## 2017-04-24 HISTORY — PX: LEFT HEART CATH AND CORONARY ANGIOGRAPHY: CATH118249

## 2017-04-24 LAB — GLUCOSE, CAPILLARY: GLUCOSE-CAPILLARY: 140 mg/dL — AB (ref 65–99)

## 2017-04-24 SURGERY — LEFT HEART CATH AND CORONARY ANGIOGRAPHY
Anesthesia: Moderate Sedation | Laterality: Left

## 2017-04-24 MED ORDER — FENTANYL CITRATE (PF) 100 MCG/2ML IJ SOLN
INTRAMUSCULAR | Status: AC
  Filled 2017-04-24: qty 2

## 2017-04-24 MED ORDER — ASPIRIN 81 MG PO CHEW
CHEWABLE_TABLET | ORAL | Status: AC
  Filled 2017-04-24: qty 1

## 2017-04-24 MED ORDER — VERAPAMIL HCL 2.5 MG/ML IV SOLN
INTRAVENOUS | Status: AC
  Filled 2017-04-24: qty 2

## 2017-04-24 MED ORDER — SODIUM CHLORIDE 0.9 % IV SOLN: INTRAVENOUS | Status: DC

## 2017-04-24 MED ORDER — SODIUM CHLORIDE 0.9% FLUSH: 3.0000 mL | INTRAVENOUS | Status: DC | PRN

## 2017-04-24 MED ORDER — IOPAMIDOL (ISOVUE-300) INJECTION 61%
INTRAVENOUS | Status: DC | PRN
  Administered 2017-04-24: 70 mL via INTRA_ARTERIAL

## 2017-04-24 MED ORDER — HEPARIN SODIUM (PORCINE) 1000 UNIT/ML IJ SOLN
INTRAMUSCULAR | Status: AC
  Filled 2017-04-24: qty 1

## 2017-04-24 MED ORDER — VERAPAMIL HCL 2.5 MG/ML IV SOLN
INTRAVENOUS | Status: DC | PRN
  Administered 2017-04-24: 2.5 mg via INTRA_ARTERIAL

## 2017-04-24 MED ORDER — FENTANYL CITRATE (PF) 100 MCG/2ML IJ SOLN
INTRAMUSCULAR | Status: DC | PRN
  Administered 2017-04-24: 25 ug via INTRAVENOUS

## 2017-04-24 MED ORDER — LIDOCAINE HCL (PF) 1 % IJ SOLN
INTRAMUSCULAR | Status: AC
  Filled 2017-04-24: qty 30

## 2017-04-24 MED ORDER — SODIUM CHLORIDE 0.9 % IV SOLN
250.0000 mL | INTRAVENOUS | Status: DC | PRN
Start: 2017-04-24 — End: 2017-04-24

## 2017-04-24 MED ORDER — MIDAZOLAM HCL 2 MG/2ML IJ SOLN
INTRAMUSCULAR | Status: DC | PRN
  Administered 2017-04-24: 1 mg via INTRAVENOUS

## 2017-04-24 MED ORDER — SODIUM CHLORIDE 0.9% FLUSH: 3.0000 mL | Freq: Two times a day (BID) | INTRAVENOUS | Status: DC

## 2017-04-24 MED ORDER — LIDOCAINE HCL (PF) 1 % IJ SOLN
INTRAMUSCULAR | Status: DC | PRN
  Administered 2017-04-24: 3 mL via INTRADERMAL

## 2017-04-24 MED ORDER — SODIUM CHLORIDE 0.9 % IV SOLN: 250.0000 mL | INTRAVENOUS | Status: DC | PRN

## 2017-04-24 MED ORDER — SODIUM CHLORIDE 0.9 % IV SOLN
INTRAVENOUS | Status: DC
  Administered 2017-04-24: 11:00:00 via INTRAVENOUS

## 2017-04-24 MED ORDER — ASPIRIN 81 MG PO CHEW
81.0000 mg | CHEWABLE_TABLET | ORAL | Status: AC
  Administered 2017-04-24: 81 mg via ORAL

## 2017-04-24 MED ORDER — MIDAZOLAM HCL 2 MG/2ML IJ SOLN
INTRAMUSCULAR | Status: AC
  Filled 2017-04-24: qty 2

## 2017-04-24 MED ORDER — HEPARIN SODIUM (PORCINE) 1000 UNIT/ML IJ SOLN
INTRAMUSCULAR | Status: DC | PRN
  Administered 2017-04-24: 5000 [IU] via INTRAVENOUS

## 2017-04-24 SURGICAL SUPPLY — 7 items
CATH INFINITI 5 FR JL3.5 (CATHETERS) ×3 IMPLANT
CATH OPTITORQUE JACKY 4.0 5F (CATHETERS) ×3 IMPLANT
DEVICE RAD TR BAND REGULAR (VASCULAR PRODUCTS) ×3 IMPLANT
GLIDESHEATH SLEND SS 6F .021 (SHEATH) ×3 IMPLANT
KIT MANI 3VAL PERCEP (MISCELLANEOUS) ×3 IMPLANT
PACK CARDIAC CATH (CUSTOM PROCEDURE TRAY) ×3 IMPLANT
WIRE ROSEN-J .035X260CM (WIRE) ×3 IMPLANT

## 2017-04-24 NOTE — H&P (View-Only) (Signed)
Cardiology Office Note   Date:  04/18/2017   ID:  Stephen [name], DOB Sep 11, 1975, MRN [id number]  PCP:  Mikey College, NP  Cardiologist:   Kathlyn Sacramento, MD   Chief Complaint  Patient presents with  . other    Ref by Dr. Merrilyn Puma for chest pain with having an ETT & Echo that is scheduled 1 week from today. Pt. c/o shortness of breath and chest pain.        History of Present Illness: Stephen [name] is a 41 y.o. male who Was referred by Cassell Smiles for evaluation of chest pain. He has no prior cardiac history and was not seen by a physician for more than 20 years up until recently. He presented for evaluation of chest pain. He reports a prolonged episode of chest pain about 4 weeks ago which was described as sharp and heartburn feeling followed by shortness of breath. The episode of chest pain lasted for a few hours. He had a similar episode a few days later and none since then. However, he has experienced worsening exertional dyspnea since that time with fatigue. No orthopnea, PND or leg edema. He does not have any chronic medical conditions.. However, he had routine labs done recently and he was diagnosed with type 2 diabetes with hemoglobin A1c of 10.2. His lipid profile was also elevated. He quit smoking about 25 years ago. He drinks alcohol occasionally on weekends. He does not have family history of premature coronary artery disease. His mother did have CABG in her 19s.    Past Medical History:  Diagnosis Date  . Kidney stones 2002, 2008   no stone eval    Past Surgical History:  Procedure Laterality Date  . APPENDECTOMY       Current Outpatient Prescriptions  Medication Sig Dispense Refill  . azithromycin (ZITHROMAX Z-PAK) 250 MG tablet Take 1 tablet twice today.  Then, take 1 tablet once daily. 6 each 0   No current facility-administered medications for this visit.     Allergies:   Patient has no known allergies.    Social History:  The patient  reports  that he quit smoking about 2 years ago. His smoking use included Cigarettes. He has never used smokeless tobacco. He reports that he drinks about 1.8 oz of alcohol per week . He reports that he does not use drugs.   Family History:  [name] family history includes CAD (age of onset: 14) in his mother; Cancer in his father; Cholecystitis in his father; Chronic Renal Failure in his mother; Diabetes in his father, paternal uncle, and sister; Healthy in his sister, son, and son; Heart disease in his mother; Hyperlipidemia in his mother; Hypertension in his mother; Hyperthyroidism in his sister, sister, and sister; Lung cancer in his paternal aunt; Multiple sclerosis in his sister; Pancreatic cancer in his father and paternal uncle; Stroke in his paternal uncle; Thyroid disease in his mother.    ROS:  Please see the history of present illness.   Otherwise, review of systems are positive for none.   All other systems are reviewed and negative.    PHYSICAL EXAM: VS:  BP 120/80 (BP Location: Right Arm, Patient Position: Sitting, Cuff Size: Normal)   Pulse 78   Ht 5\' 10"  (1.778 m)   Wt 224 lb (101.6 kg)   BMI 32.14 kg/m  , BMI Body mass index is 32.14 kg/m. GEN: Well nourished, well developed, in no acute distress  HEENT: normal  Neck: no  JVD, carotid bruits, or masses Cardiac: RRR; no murmurs, rubs, or gallops,no edema  Respiratory:  clear to auscultation bilaterally, normal work of breathing GI: soft, nontender, nondistended, + BS MS: no deformity or atrophy  Skin: warm and dry, no rash Neuro:  Strength and sensation are intact Psych: euthymic mood, full affect   EKG:  EKG is ordered today. 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 No  Previous surgical procedure? No  Pain with walking? No  Feet/toe relief with dangling? No  Painful, non-healing ulcers? No  Extremities discolored? No      ASSESSMENT AND PLAN:  1.  Chest pain and exertional dyspnea: He had a prolonged episode of chest pain about 4 weeks ago. 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 He was also recently diagnosed with type 2 diabetes with hemoglobin A1c above 10 with elevated LDL. Due to this, I recommend urgent evaluation with a treadmill nuclear stress test. Given his abnormal EKG with left axis deviation and inferior infarct, a treadmill stress test alone is probably not going to be diagnostic.  He is already scheduled for an echocardiogram next week for evaluation of dyspnea.  2. Hyperlipidemia: Recommend treatment with a statin given newly diagnosed diabetes.    Disposition:   FU with me based on test results.   Signed,  Kathlyn Sacramento, MD  04/18/2017 1:47 PM    [facility]

## 2017-04-24 NOTE — Interval H&P Note (Signed)
Stephen Lab Visit (complete for each Stephen Lab visit)  Clinical Evaluation Leading to the Procedure:   ACS: No.  Non-ACS:    Anginal Classification: CCS III  Anti-ischemic medical therapy: No Therapy  Non-Invasive Test Results: Stephen-risk stress test findings: cardiac mortality >3%/year  Prior CABG: No previous CABG      History and Physical Interval Note:  04/24/2017 12:33 PM  Stephen [name]  has presented today for surgery, with the diagnosis of Left Heart Stephen   The various methods of treatment have been discussed with the patient and family. After consideration of risks, benefits and other options for treatment, the patient has consented to  Procedure(s): LEFT HEART Stephen AND CORONARY ANGIOGRAPHY (Left) as a surgical intervention .  [name] history has been reviewed, patient examined, no change in status, stable for surgery.  I have reviewed the patient's chart and labs.  Questions were answered to the patient's satisfaction.     Kathlyn Sacramento

## 2017-04-25 ENCOUNTER — Other Ambulatory Visit: Payer: BLUE CROSS/BLUE SHIELD

## 2017-04-25 ENCOUNTER — Other Ambulatory Visit: Payer: Self-pay

## 2017-04-27 ENCOUNTER — Ambulatory Visit: Payer: Self-pay | Admitting: Family Medicine

## 2017-05-04 ENCOUNTER — Encounter: Payer: Self-pay | Admitting: *Deleted

## 2017-05-04 ENCOUNTER — Encounter: Payer: BLUE CROSS/BLUE SHIELD | Attending: Nurse Practitioner | Admitting: *Deleted

## 2017-05-04 VITALS — BP 122/78 | Ht 70.0 in | Wt 214.6 lb

## 2017-05-04 DIAGNOSIS — Z713 Dietary counseling and surveillance: Secondary | ICD-10-CM | POA: Insufficient documentation

## 2017-05-04 DIAGNOSIS — E119 Type 2 diabetes mellitus without complications: Secondary | ICD-10-CM | POA: Diagnosis not present

## 2017-05-04 NOTE — Progress Notes (Signed)
Diabetes Self-Management Education  Visit Type: First/Initial  Appt. Start Time: 1325 Appt. End Time: 3875  05/04/2017  Mr. Stephen [name], identified by name and date of birth, is a 41 y.o. male with a diagnosis of Diabetes: Type 2.   ASSESSMENT  Blood pressure 122/78, height 5\' 10"  (1.778 m), weight 214 lb 9.6 oz (97.3 kg). Body mass index is 30.79 kg/m.      Diabetes Self-Management Education - 05/04/17 1454      Visit Information   Visit Type First/Initial     Initial Visit   Diabetes Type Type 2   Are you currently following a meal plan? Yes   What type of meal plan do you follow? "diabetic"   Are you taking your medications as prescribed? Yes   Date Diagnosed 2 weeks ago     Health Coping   How would you rate your overall health? Good     Psychosocial Assessment   Patient Belief/Attitude about Diabetes Motivated to manage diabetes   Self-care barriers None   Self-management support Doctor's office;Friends   Patient Concerns Nutrition/Meal planning;Glycemic Control;Medication;Monitoring;Healthy Lifestyle;Problem Solving   Special Needs None   Preferred Learning Style Auditory;Visual;Hands on   [place] in progress   How often do you need to have someone help you when you read instructions, pamphlets, or other written materials from your doctor or pharmacy? 1 - Never   What is the last grade level you completed in school? Associate degree     Pre-Education Assessment   Patient understands the diabetes disease and treatment process. Needs Instruction   Patient understands incorporating nutritional management into lifestyle. Needs Instruction   Patient undertands incorporating physical activity into lifestyle. Needs Instruction   Patient understands using medications safely. Needs Instruction   Patient understands monitoring blood glucose, interpreting and using results Needs Review   Patient understands prevention, detection, and treatment of acute  complications. Needs Instruction   Patient understands prevention, detection, and treatment of chronic complications. Needs Instruction   Patient understands how to develop strategies to address psychosocial issues. Needs Instruction   Patient understands how to develop strategies to promote health/change behavior. Needs Instruction     Complications   Last HgB A1C per patient/outside source 10.2 %  04/14/17   How often do you check your blood sugar? 1-2 times/day   Fasting Blood glucose range (mg/dL) 70-129;130-179  FBG's range from 120-149 mg/dL.   Have you had a dilated eye exam in the past 12 months? No   Have you had a dental exam in the past 12 months? Yes   Are you checking your feet? No     Dietary Intake   Breakfast steel cut oats with strawberries and blueberries or [place] vanilla yogurt with fruit or egg and [place] sausage   Lunch grilled chicken salad   Snack (afternoon) protein bar or [place] yogurt   Dinner bought Healthy Choice meals with low sodium; eat chicken, pork, beef with broccolli, green beans, salad   Beverage(s) water, coffee, diet soda     Exercise   Exercise Type Light (walking / raking leaves)  45 minutes on treadmill and 15 minutes light weights   How many days per week to you exercise? 2 will be starting 3 x week   How many minutes per day do you exercise? 60   Total minutes per week of exercise 120     Patient Education   Previous Diabetes Education No   Disease state  Definition of diabetes, type  1 and 2, and the diagnosis of diabetes   Nutrition management  Role of diet in the treatment of diabetes and the relationship between the three main macronutrients and blood glucose level;Reviewed blood glucose goals for pre and post meals and how to evaluate the patients' food intake on their blood glucose level.;Meal timing in regards to the patients' current diabetes medication.   Physical activity and exercise  Role of exercise on diabetes management, blood  pressure control and cardiac health.   Medications Reviewed patients medication for diabetes, action, purpose, timing of dose and side effects.   Monitoring Purpose and frequency of SMBG.;Taught/discussed recording of test results and interpretation of SMBG.;Identified appropriate SMBG and/or A1C goals.   Chronic complications Relationship between chronic complications and blood glucose control;Retinopathy and reason for yearly dilated eye exams   Psychosocial adjustment Identified and addressed patients feelings and concerns about diabetes     Individualized Goals (developed by patient)   Reducing Risk Improve blood sugars Decrease medications Prevent diabetes complications Lead a healthier lifestyle Become more fit     Outcomes   Expected Outcomes Demonstrated interest in learning. Expect positive outcomes      Individualized Plan for Diabetes Self-Management Training:   Learning Objective:  Patient will have a greater understanding of diabetes self-management. Patient education plan is to attend individual and/or group sessions per assessed needs and concerns.   Plan:   Patient Instructions  Check blood sugars 1 x day before breakfast or 2 hrs after one meal every day Bring blood sugar records to the next class Exercise: Continue routine at gym  for   60  minutes  3 days a week if approved by cardiologist Eat 3 meals day, 1-2 snacks a day Space meals 4-6 hours apart Include serving of protein with breakfast Make an eye doctor appointment  Expected Outcomes:  Demonstrated interest in learning. Expect positive outcomes  Education material provided:  General Meal Planning Guidelines Simple Meal Plan  If problems or questions, patient to contact team via:  Johny Drilling, [place], [place], CDE [phone]  Future DSME appointment:  May 08, 2017 for Diabetes Class 1

## 2017-05-04 NOTE — Patient Instructions (Addendum)
Check blood sugars 1 x day before breakfast or 2 hrs after one meal every day Bring blood sugar records to the next class  Exercise: Continue routine at gym  for   60  minutes  3 days a week if approved by cardiologist  Eat 3 meals day, 1-2 snacks a day Space meals 4-6 hours apart Include serving of protein with breakfast  Make an eye doctor appointment  Return for classes on:

## 2017-05-05 ENCOUNTER — Ambulatory Visit (INDEPENDENT_AMBULATORY_CARE_PROVIDER_SITE_OTHER): Payer: BLUE CROSS/BLUE SHIELD | Admitting: Cardiovascular Disease

## 2017-05-05 ENCOUNTER — Other Ambulatory Visit: Payer: Self-pay

## 2017-05-05 ENCOUNTER — Encounter: Payer: Self-pay | Admitting: Cardiovascular Disease

## 2017-05-05 ENCOUNTER — Other Ambulatory Visit
Admission: RE | Admit: 2017-05-05 | Discharge: 2017-05-05 | Disposition: A | Discharge status: Home / Self Care | Payer: BLUE CROSS/BLUE SHIELD | Source: Ambulatory Visit | Attending: Cardiovascular Disease | Admitting: Cardiovascular Disease

## 2017-05-05 VITALS — BP 110/80 | HR 73 | Ht 70.0 in | Wt 214.8 lb

## 2017-05-05 DIAGNOSIS — E785 Hyperlipidemia, unspecified: Secondary | ICD-10-CM | POA: Diagnosis not present

## 2017-05-05 DIAGNOSIS — Z01812 Encounter for preprocedural laboratory examination: Secondary | ICD-10-CM | POA: Insufficient documentation

## 2017-05-05 DIAGNOSIS — I25118 Atherosclerotic heart disease of native coronary artery with other forms of angina pectoris: Secondary | ICD-10-CM

## 2017-05-05 LAB — CBC
HEMATOCRIT: 43.5 % (ref 40.0–52.0)
HEMOGLOBIN: 15.3 g/dL (ref 13.0–18.0)
MCH: 30 pg (ref 26.0–34.0)
MCHC: 35.1 g/dL (ref 32.0–36.0)
MCV: 85.5 fL (ref 80.0–100.0)
Platelets: 217 10*3/uL (ref 150–440)
RBC: 5.09 MIL/uL (ref 4.40–5.90)
RDW: 13 % (ref 11.5–14.5)
WBC: 5.9 10*3/uL (ref 3.8–10.6)

## 2017-05-05 LAB — BASIC METABOLIC PANEL
ANION GAP: 10 (ref 5–15)
BUN: 14 mg/dL (ref 6–20)
CALCIUM: 9.7 mg/dL (ref 8.9–10.3)
CHLORIDE: 101 mmol/L (ref 101–111)
CO2: 26 mmol/L (ref 22–32)
Creatinine, Ser: 0.79 mg/dL (ref 0.61–1.24)
GFR calc non Af Amer: >60 mL/min (ref >60)
Glucose, Bld: 102 mg/dL — ABNORMAL HIGH (ref 65–99)
Potassium: 4 mmol/L (ref 3.5–5.1)
Sodium: 137 mmol/L (ref 135–145)

## 2017-05-05 LAB — PROTIME-INR
INR: 0.92
PROTHROMBIN TIME: 12.3 s (ref 11.4–15.2)

## 2017-05-05 MED ORDER — NITROGLYCERIN 0.4 MG SL SUBL: 0.4000 mg | SUBLINGUAL_TABLET | SUBLINGUAL | 3 refills | Status: DC | PRN

## 2017-05-05 NOTE — Patient Instructions (Addendum)
Medication Instructions:  Your physician recommends that you continue on your current medications as directed. Please refer to the Current Medication list given to you today.   Labwork: BMET, CBC, PT/INR by September 27 at the [facility]  Testing/Procedures: Your physician has requested that you have a PCI.     [facility] [address] [place] [address] Dept: [phone] Loc: [place]  05/05/2017  You are scheduled for a PCI on Monday, October 1 with Dr. Kathlyn Sacramento.  1. Please arrive at the [facility] at 8:30 AM (two hours before your procedure to ensure your preparation). Free valet parking service is available.   Special note: Every effort is made to have your procedure done on time. Please understand that emergencies sometimes delay scheduled procedures.  2. Diet: Do not eat or drink anything after midnight prior to your procedure except sips of water to take medications.  3. Labs: by Sept 27 at the [facility]  4. Medication instructions in preparation for your procedure:  Please stop your metformin 24 hours before and for 48 hours after your procedure. You should take your other medications with a sip of water.   5. Plan for one night stay--bring personal belongings. 6. Bring a current list of your medications and current insurance cards. 7. You MUST have a responsible person to drive you home. 8. Someone MUST be with you the first 24 hours after you arrive home or your discharge will be delayed. 9. Please wear clothes that are easy to get on and off and wear slip-on shoes.  Thank you for allowing [place] to care for you!   -- [facility]   Follow-Up: Your physician recommends that you schedule a follow-up appointment in: one month with Dr. Fletcher Anon.    Any Other Special Instructions Will Be Listed Below (If  Applicable).     If you need a refill on your cardiac medications before your next appointment, please call your pharmacy.

## 2017-05-05 NOTE — Progress Notes (Signed)
Cardiology Office Note   Date:  05/05/2017   ID:  Stephen [name], DOB Dec 14, 1975, MRN [id number]  PCP:  Mikey College, NP  Cardiologist:   Kathlyn Sacramento, MD   Chief Complaint  Patient presents with  . other    Post cardiac cath c/o chest pain last week taking extra Aspirin to help with pain. Meds reviewed verbally with pt.      History of Present Illness: Stephen [name] is a 41 y.o. male who is here today for a follow-up visit regarding coronary artery disease.  He was seen recently for chest pain with no prior cardiac history.  He was recently diagnosed with type 2 diabetes with hemoglobin A1c of 10.2 and is known to have hyperlipidemia. He was noted and EKG to have evidence of prior inferior infarct.  He underwent a nuclear stress test which showed evidence of infarct in the inferior and inferolateral area with significant peri-infarct ischemia and normal ejection fraction. Echocardiogram was normal. I proceeded with cardiac catheterization which showed significant underlying three-vessel coronary artery disease with subacute thrombotic occlusion of the mid to distal right coronary artery with left-to-right collaterals, occluded OM 3 with left to left collaterals, significant stenosis in OM 2 and moderate disease in second diagonal. The LAD was mildly and diffusely diseased. Ejection fraction was normal. Left ventricular end-diastolic pressure was high normal.  Since cardiac catheterization, he reports one episode of prolonged chest pain that lasted for about 15 minutes. He has been trying to gradually increase his physical activities. He improved his lifestyle significantly.   Past Medical History:  Diagnosis Date  . Diabetes mellitus without complication ([place])   . Hyperlipidemia   . Kidney stones 2002, 2008   no stone eval  . Myocardial infarction [facility])     Past Surgical History:  Procedure Laterality Date  . APPENDECTOMY    . LEFT HEART CATH AND CORONARY ANGIOGRAPHY  Left 04/24/2017   Procedure: LEFT HEART CATH AND CORONARY ANGIOGRAPHY;  Surgeon: Wellington Hampshire, MD;  Location: [place] [facility];  Service: Cardiovascular;  Laterality: Left;     Current Outpatient Prescriptions  Medication Sig Dispense Refill  . aspirin EC 81 MG tablet Take 1 tablet (81 mg total) by mouth daily. 90 tablet 3  . atorvastatin (LIPITOR) 40 MG tablet Take 1 tablet (40 mg total) by mouth daily. 90 tablet 3  . blood glucose meter kit and supplies KIT Dispense based on patient and insurance preference. Use up to four times daily as directed. (FOR ICD-9 250.00, 250.01). 1 each 0  . clopidogrel (PLAVIX) 75 MG tablet Take 1 tablet (75 mg total) by mouth daily. 90 tablet 3  . Icosapent Ethyl (VASCEPA) 1 g CAPS Take 2 g by mouth 2 (two) times daily. 360 capsule 3  . Menthol, Topical Analgesic, (ICY HOT EX) Apply 1 application topically 2 (two) times daily as needed (for back pain.).    Marland Kitchen metFORMIN (GLUCOPHAGE) 500 MG tablet Take 1 tablet (500 mg total) by mouth 2 (two) times daily with a meal. 60 tablet 5   No current facility-administered medications for this visit.     Allergies:   Patient has no known allergies.    Social History:  The patient  reports that he quit smoking about 22 years ago. His smoking use included Cigarettes. He has a 0.30 pack-year smoking history. He has never used smokeless tobacco. He reports that he drinks about 1.8 oz of alcohol per week . He reports that he  does not use drugs.   Family History:  [name] family history includes CAD (age of onset: 76) in his mother; Cancer in his father; Cholecystitis in his father; Chronic Renal Failure in his mother; Diabetes in his father, paternal uncle, and sister; Healthy in his sister, son, and son; Heart disease in his mother; Hyperlipidemia in his mother; Hypertension in his mother; Hyperthyroidism in his sister, sister, and sister; Lung cancer in his paternal aunt; Multiple sclerosis in his sister;  Pancreatic cancer in his father and paternal uncle; Stroke in his paternal uncle; Thyroid disease in his mother.    ROS:  Please see the history of present illness.   Otherwise, review of systems are positive for none.   All other systems are reviewed and negative.    PHYSICAL EXAM: VS:  BP 110/80 (BP Location: Left Arm, Patient Position: Sitting, Cuff Size: Normal)   Pulse 73   Ht '5\' 10"'  (1.778 m)   Wt 214 lb 12 oz (97.4 kg)   BMI 30.81 kg/m  , BMI Body mass index is 30.81 kg/m. GEN: Well nourished, well developed, in no acute distress  HEENT: normal  Neck: no JVD, carotid bruits, or masses Cardiac: RRR; no murmurs, rubs, or gallops,no edema  Respiratory:  clear to auscultation bilaterally, normal work of breathing GI: soft, nontender, nondistended, + BS MS: no deformity or atrophy  Skin: warm and dry, no rash Neuro:  Strength and sensation are intact Psych: euthymic mood, full affect Right radial pulse is normal with no hematoma  EKG:  EKG is ordered today. The ekg ordered today demonstrates normal sinus rhythm with left axis deviation . Old inferior infarct.  Recent Labs: 04/14/2017: ALT 38; BUN 13; Creat 0.81; Hemoglobin 16.6; Platelets 223; Potassium 4.2; Sodium 138; TSH 1.87    Lipid Panel    Component Value Date/Time   CHOL 219 (H) 04/14/2017 1137   TRIG 278 (H) 04/14/2017 1137   HDL 37 (L) 04/14/2017 1137   CHOLHDL 5.9 (H) 04/14/2017 1137   VLDL 56 (H) 04/14/2017 1137   LDLCALC 126 (H) 04/14/2017 1137      Wt Readings from Last 3 Encounters:  05/05/17 214 lb 12 oz (97.4 kg)  05/04/17 214 lb 9.6 oz (97.3 kg)  04/24/17 219 lb (99.3 kg)        PAD Screen 04/18/2017  Previous PAD dx? No  Previous surgical procedure? No  Pain with walking? No  Feet/toe relief with dangling? No  Painful, non-healing ulcers? No  Extremities discolored? No      ASSESSMENT AND PLAN:  1.  Coronary artery disease involving native coronary arteries with other forms of  angina: The plan was to treat him medically given the presence of collaterals. However, given his recent episode of chest pain, I think the best option is to proceed with PCI if OM 2 to improve ischemic burden. If he continues to have symptoms after that, RCA CTO PCI can be considered to be done within 3 months to improve the chance of success. Otherwise, continue aggressive medical therapy. I discussed PCI procedure in detail as well as risk and benefit. Continue dual antiplatelet therapy.  I am planning to refer him to cardiac rehabilitation after PCI.  2. Hyperlipidemia: Continue treatment with atorvastatin with a target LDL of less than 70. He will need a follow-up lipid and liver profile.  3. Type 2 diabetes: Likely the culprit for his diffuse coronary artery disease. Recommend a target hemoglobin A1c less than 7 and consider treatment  with an SGLT2 inhibitor given improved cardiovascular events with this class of medications.   Disposition:   FU with me in one month.  Signed,  Kathlyn Sacramento, MD  05/05/2017 3:08 PM    [facility]

## 2017-05-08 ENCOUNTER — Telehealth: Payer: Self-pay | Admitting: *Deleted

## 2017-05-08 NOTE — Telephone Encounter (Signed)
Received call from patient. He reports that he will have to cancel diabetes class for tonight. Patient reports he will having a stent placed next Mon Oct 1 and will need to reschedule class series. He will begin June 05, 2017.

## 2017-05-08 NOTE — Addendum Note (Signed)
Addended by: Britt Bottom on: 05/08/2017 03:57 PM   Modules accepted: Orders

## 2017-05-12 ENCOUNTER — Telehealth: Payer: Self-pay | Admitting: Cardiovascular Disease

## 2017-05-12 ENCOUNTER — Ambulatory Visit: Payer: BLUE CROSS/BLUE SHIELD | Admitting: Nurse Practitioner

## 2017-05-12 NOTE — Telephone Encounter (Signed)
Reviewed PCI instructions with pt who verbalized understanding.  He had no questions at this time.

## 2017-05-15 ENCOUNTER — Other Ambulatory Visit: Payer: Self-pay

## 2017-05-15 ENCOUNTER — Ambulatory Visit
Admission: RE | Admit: 2017-05-15 | Discharge: 2017-05-16 | Disposition: A | Discharge status: Home / Self Care | Payer: BLUE CROSS/BLUE SHIELD | Source: Ambulatory Visit | Attending: Cardiovascular Disease | Admitting: Cardiovascular Disease

## 2017-05-15 ENCOUNTER — Encounter
Admission: RE | Disposition: A | Discharge status: Home / Self Care | Payer: Self-pay | Source: Ambulatory Visit | Attending: Cardiovascular Disease

## 2017-05-15 ENCOUNTER — Encounter: Payer: Self-pay | Admitting: Emergency Medicine

## 2017-05-15 DIAGNOSIS — Z79899 Other long term (current) drug therapy: Secondary | ICD-10-CM | POA: Diagnosis not present

## 2017-05-15 DIAGNOSIS — Z7902 Long term (current) use of antithrombotics/antiplatelets: Secondary | ICD-10-CM | POA: Insufficient documentation

## 2017-05-15 DIAGNOSIS — I25118 Atherosclerotic heart disease of native coronary artery with other forms of angina pectoris: Secondary | ICD-10-CM | POA: Diagnosis not present

## 2017-05-15 DIAGNOSIS — I252 Old myocardial infarction: Secondary | ICD-10-CM | POA: Insufficient documentation

## 2017-05-15 DIAGNOSIS — Z7984 Long term (current) use of oral hypoglycemic drugs: Secondary | ICD-10-CM | POA: Diagnosis not present

## 2017-05-15 DIAGNOSIS — E785 Hyperlipidemia, unspecified: Secondary | ICD-10-CM | POA: Diagnosis not present

## 2017-05-15 DIAGNOSIS — I209 Angina pectoris, unspecified: Secondary | ICD-10-CM | POA: Diagnosis present

## 2017-05-15 DIAGNOSIS — I25119 Atherosclerotic heart disease of native coronary artery with unspecified angina pectoris: Secondary | ICD-10-CM | POA: Diagnosis present

## 2017-05-15 DIAGNOSIS — I2510 Atherosclerotic heart disease of native coronary artery without angina pectoris: Secondary | ICD-10-CM

## 2017-05-15 DIAGNOSIS — E119 Type 2 diabetes mellitus without complications: Secondary | ICD-10-CM | POA: Insufficient documentation

## 2017-05-15 DIAGNOSIS — Z8249 Family history of ischemic heart disease and other diseases of the circulatory system: Secondary | ICD-10-CM | POA: Diagnosis not present

## 2017-05-15 DIAGNOSIS — Z87891 Personal history of nicotine dependence: Secondary | ICD-10-CM | POA: Diagnosis not present

## 2017-05-15 DIAGNOSIS — Z7982 Long term (current) use of aspirin: Secondary | ICD-10-CM | POA: Insufficient documentation

## 2017-05-15 HISTORY — DX: Type 2 diabetes mellitus with unspecified complications: E11.8

## 2017-05-15 HISTORY — DX: Personal history of other medical treatment: Z92.89

## 2017-05-15 HISTORY — PX: CORONARY STENT INTERVENTION: CATH118234

## 2017-05-15 HISTORY — DX: Atherosclerotic heart disease of native coronary artery without angina pectoris: I25.10

## 2017-05-15 LAB — GLUCOSE, CAPILLARY: Glucose-Capillary: 118 mg/dL — ABNORMAL HIGH (ref 65–99)

## 2017-05-15 LAB — POCT ACTIVATED CLOTTING TIME: ACTIVATED CLOTTING TIME: 323 s

## 2017-05-15 SURGERY — CORONARY STENT INTERVENTION
Anesthesia: Moderate Sedation

## 2017-05-15 MED ORDER — FENTANYL CITRATE (PF) 100 MCG/2ML IJ SOLN
INTRAMUSCULAR | Status: DC | PRN
  Administered 2017-05-15: 25 ug via INTRAVENOUS

## 2017-05-15 MED ORDER — FENTANYL CITRATE (PF) 100 MCG/2ML IJ SOLN
INTRAMUSCULAR | Status: AC
  Filled 2017-05-15: qty 2

## 2017-05-15 MED ORDER — ATORVASTATIN CALCIUM 20 MG PO TABS
40.0000 mg | ORAL_TABLET | Freq: Every day | ORAL | Status: DC
  Administered 2017-05-16: 40 mg via ORAL
  Filled 2017-05-15 (×2): qty 2

## 2017-05-15 MED ORDER — METOPROLOL SUCCINATE ER 25 MG PO TB24
25.0000 mg | ORAL_TABLET | Freq: Every day | ORAL | Status: DC
  Administered 2017-05-16: 25 mg via ORAL
  Filled 2017-05-15: qty 1

## 2017-05-15 MED ORDER — CLOPIDOGREL BISULFATE 75 MG PO TABS
ORAL_TABLET | ORAL | Status: AC
  Filled 2017-05-15: qty 3

## 2017-05-15 MED ORDER — MIDAZOLAM HCL 2 MG/2ML IJ SOLN
INTRAMUSCULAR | Status: DC | PRN
  Administered 2017-05-15: 1 mg via INTRAVENOUS

## 2017-05-15 MED ORDER — SODIUM CHLORIDE 0.9% FLUSH: 3.0000 mL | Freq: Two times a day (BID) | INTRAVENOUS | Status: DC

## 2017-05-15 MED ORDER — MIDAZOLAM HCL 2 MG/2ML IJ SOLN
INTRAMUSCULAR | Status: AC
  Filled 2017-05-15: qty 2

## 2017-05-15 MED ORDER — ASPIRIN 81 MG PO CHEW
CHEWABLE_TABLET | ORAL | Status: AC
  Filled 2017-05-15: qty 3

## 2017-05-15 MED ORDER — SODIUM CHLORIDE 0.9% FLUSH
3.0000 mL | Freq: Two times a day (BID) | INTRAVENOUS | Status: DC
  Administered 2017-05-15: 3 mL via INTRAVENOUS

## 2017-05-15 MED ORDER — ACETAMINOPHEN 500 MG PO TABS: 500.0000 mg | ORAL_TABLET | Freq: Four times a day (QID) | ORAL | Status: DC | PRN

## 2017-05-15 MED ORDER — SODIUM CHLORIDE 0.9% FLUSH: 3.0000 mL | INTRAVENOUS | Status: DC | PRN

## 2017-05-15 MED ORDER — HEPARIN SODIUM (PORCINE) 1000 UNIT/ML IJ SOLN
INTRAMUSCULAR | Status: DC | PRN
  Administered 2017-05-15: 9000 [IU] via INTRAVENOUS

## 2017-05-15 MED ORDER — SODIUM CHLORIDE 0.9 % IV SOLN
INTRAVENOUS | Status: DC
  Administered 2017-05-15: 09:00:00 via INTRAVENOUS

## 2017-05-15 MED ORDER — VERAPAMIL HCL 2.5 MG/ML IV SOLN
INTRAVENOUS | Status: AC
  Filled 2017-05-15: qty 2

## 2017-05-15 MED ORDER — OMEGA-3-ACID ETHYL ESTERS 1 G PO CAPS
2.0000 g | ORAL_CAPSULE | Freq: Two times a day (BID) | ORAL | Status: DC
  Administered 2017-05-15 – 2017-05-16 (×2): 2 g via ORAL
  Filled 2017-05-15 (×2): qty 2

## 2017-05-15 MED ORDER — ASPIRIN 81 MG PO CHEW: 81.0000 mg | CHEWABLE_TABLET | ORAL | Status: DC

## 2017-05-15 MED ORDER — SODIUM CHLORIDE 0.9 % IV SOLN: 250.0000 mL | INTRAVENOUS | Status: DC | PRN

## 2017-05-15 MED ORDER — SODIUM CHLORIDE 0.9 % IV SOLN
250.0000 mL | INTRAVENOUS | Status: DC | PRN
Start: 2017-05-15 — End: 2017-05-16

## 2017-05-15 MED ORDER — HEPARIN (PORCINE) IN NACL 2-0.9 UNIT/ML-% IJ SOLN
INTRAMUSCULAR | Status: AC
  Filled 2017-05-15: qty 1000

## 2017-05-15 MED ORDER — HEPARIN SODIUM (PORCINE) 1000 UNIT/ML IJ SOLN
INTRAMUSCULAR | Status: AC
  Filled 2017-05-15: qty 1

## 2017-05-15 MED ORDER — IOPAMIDOL (ISOVUE-300) INJECTION 61%
INTRAVENOUS | Status: DC | PRN
  Administered 2017-05-15: 90 mL via INTRA_ARTERIAL

## 2017-05-15 MED ORDER — VERAPAMIL HCL 2.5 MG/ML IV SOLN
INTRAVENOUS | Status: DC | PRN
  Administered 2017-05-15: 2.5 mg via INTRA_ARTERIAL

## 2017-05-15 MED ORDER — ICOSAPENT ETHYL 1 G PO CAPS: 2.0000 g | ORAL_CAPSULE | Freq: Two times a day (BID) | ORAL | Status: DC

## 2017-05-15 MED ORDER — NITROGLYCERIN 5 MG/ML IV SOLN
INTRAVENOUS | Status: AC
  Filled 2017-05-15: qty 10

## 2017-05-15 MED ORDER — ONDANSETRON HCL 4 MG/2ML IJ SOLN: 4.0000 mg | Freq: Four times a day (QID) | INTRAMUSCULAR | Status: DC | PRN

## 2017-05-15 MED ORDER — LIDOCAINE HCL (PF) 1 % IJ SOLN
INTRAMUSCULAR | Status: AC
  Filled 2017-05-15: qty 30

## 2017-05-15 MED ORDER — ASPIRIN EC 81 MG PO TBEC
81.0000 mg | DELAYED_RELEASE_TABLET | Freq: Every day | ORAL | Status: DC
  Administered 2017-05-16: 81 mg via ORAL
  Filled 2017-05-15 (×2): qty 1

## 2017-05-15 MED ORDER — CLOPIDOGREL BISULFATE 75 MG PO TABS
ORAL_TABLET | ORAL | Status: DC | PRN
  Administered 2017-05-15: 225 mg via ORAL

## 2017-05-15 MED ORDER — NITROGLYCERIN 0.4 MG SL SUBL: 0.4000 mg | SUBLINGUAL_TABLET | SUBLINGUAL | Status: DC | PRN

## 2017-05-15 MED ORDER — SODIUM CHLORIDE 0.9 % WEIGHT BASED INFUSION: 1.0000 mL/kg/h | INTRAVENOUS | Status: AC

## 2017-05-15 MED ORDER — NITROGLYCERIN 1 MG/10 ML FOR IR/CATH LAB
INTRA_ARTERIAL | Status: DC | PRN
  Administered 2017-05-15: 100 ug via INTRACORONARY

## 2017-05-15 MED ORDER — CLOPIDOGREL BISULFATE 75 MG PO TABS
75.0000 mg | ORAL_TABLET | Freq: Every day | ORAL | Status: DC
  Administered 2017-05-16: 75 mg via ORAL
  Filled 2017-05-15: qty 1

## 2017-05-15 SURGICAL SUPPLY — 12 items
BALLN MINITREK RX 2.0X12 (BALLOONS) ×3
BALLN ~~LOC~~ TREK RX 3.0X20 (BALLOONS) ×3
BALLOON MINITREK RX 2.0X12 (BALLOONS) ×1 IMPLANT
BALLOON ~~LOC~~ TREK RX 3.0X20 (BALLOONS) ×1 IMPLANT
CATH VISTA GUIDE 6FR XB3 (CATHETERS) ×3 IMPLANT
DEVICE INFLAT 30 PLUS (MISCELLANEOUS) ×3 IMPLANT
DEVICE RAD COMP TR BAND LRG (VASCULAR PRODUCTS) ×3 IMPLANT
GLIDESHEATH SLEND SS 6F .021 (SHEATH) ×3 IMPLANT
KIT MANI 3VAL PERCEP (MISCELLANEOUS) ×3 IMPLANT
PACK CARDIAC CATH (CUSTOM PROCEDURE TRAY) ×3 IMPLANT
STENT RESOLUTE ONYX 2.75X26 (Permanent Stent) ×3 IMPLANT
WIRE ROSEN-J .035X260CM (WIRE) ×3 IMPLANT

## 2017-05-15 NOTE — Interval H&P Note (Signed)
History and Physical Interval Note:  05/15/2017 9:31 AM  [name]  has presented today for surgery, with the diagnosis of PCI   LT circumference   The various methods of treatment have been discussed with the patient and family. After consideration of risks, benefits and other options for treatment, the patient has consented to  Procedure(s): CORONARY STENT INTERVENTION (N/A) as a surgical intervention .  [name] history has been reviewed, patient examined, no change in status, stable for surgery.  I have reviewed the patient's chart and labs.  Questions were answered to the patient's satisfaction.     Stephen [name]

## 2017-05-15 NOTE — Progress Notes (Signed)
TR band completely removed. No drainage, hematoma, edema, erythema, ecchymosis. BA, sterile 2x2, then tegaderm to site. Pt. Tolerated well. No acute distress.

## 2017-05-15 NOTE — H&P (View-Only) (Signed)
Cardiology Office Note   Date:  05/05/2017   ID:  Stephen [name], DOB 1976/05/20, MRN [id number]  PCP:  Mikey College, NP  Cardiologist:   Kathlyn Sacramento, MD   Chief Complaint  Patient presents with  . other    Post cardiac cath c/o chest pain last week taking extra Aspirin to help with pain. Meds reviewed verbally with pt.      History of Present Illness: Stephen [name] is a 41 y.o. male who is here today for a follow-up visit regarding coronary artery disease.  He was seen recently for chest pain with no prior cardiac history.  He was recently diagnosed with type 2 diabetes with hemoglobin A1c of 10.2 and is known to have hyperlipidemia. He was noted and EKG to have evidence of prior inferior infarct.  He underwent a nuclear stress test which showed evidence of infarct in the inferior and inferolateral area with significant peri-infarct ischemia and normal ejection fraction. Echocardiogram was normal. I proceeded with cardiac catheterization which showed significant underlying three-vessel coronary artery disease with subacute thrombotic occlusion of the mid to distal right coronary artery with left-to-right collaterals, occluded OM 3 with left to left collaterals, significant stenosis in OM 2 and moderate disease in second diagonal. The LAD was mildly and diffusely diseased. Ejection fraction was normal. Left ventricular end-diastolic pressure was high normal.  Since cardiac catheterization, he reports one episode of prolonged chest pain that lasted for about 15 minutes. He has been trying to gradually increase his physical activities. He improved his lifestyle significantly.   Past Medical History:  Diagnosis Date  . Diabetes mellitus without complication ([place])   . Hyperlipidemia   . Kidney stones 2002, 2008   no stone eval  . Myocardial infarction [facility])     Past Surgical History:  Procedure Laterality Date  . APPENDECTOMY    . LEFT HEART CATH AND CORONARY ANGIOGRAPHY  Left 04/24/2017   Procedure: LEFT HEART CATH AND CORONARY ANGIOGRAPHY;  Surgeon: Wellington Hampshire, MD;  Location: [place] [facility];  Service: Cardiovascular;  Laterality: Left;     Current Outpatient Prescriptions  Medication Sig Dispense Refill  . aspirin EC 81 MG tablet Take 1 tablet (81 mg total) by mouth daily. 90 tablet 3  . atorvastatin (LIPITOR) 40 MG tablet Take 1 tablet (40 mg total) by mouth daily. 90 tablet 3  . blood glucose meter kit and supplies KIT Dispense based on patient and insurance preference. Use up to four times daily as directed. (FOR ICD-9 250.00, 250.01). 1 each 0  . clopidogrel (PLAVIX) 75 MG tablet Take 1 tablet (75 mg total) by mouth daily. 90 tablet 3  . Icosapent Ethyl (VASCEPA) 1 g CAPS Take 2 g by mouth 2 (two) times daily. 360 capsule 3  . Menthol, Topical Analgesic, (ICY HOT EX) Apply 1 application topically 2 (two) times daily as needed (for back pain.).    Marland Kitchen metFORMIN (GLUCOPHAGE) 500 MG tablet Take 1 tablet (500 mg total) by mouth 2 (two) times daily with a meal. 60 tablet 5   No current facility-administered medications for this visit.     Allergies:   Patient has no known allergies.    Social History:  The patient  reports that he quit smoking about 22 years ago. His smoking use included Cigarettes. He has a 0.30 pack-year smoking history. He has never used smokeless tobacco. He reports that he drinks about 1.8 oz of alcohol per week . He reports that he  does not use drugs.   Family History:  [name] family history includes CAD (age of onset: 7) in his mother; Cancer in his father; Cholecystitis in his father; Chronic Renal Failure in his mother; Diabetes in his father, paternal uncle, and sister; Healthy in his sister, son, and son; Heart disease in his mother; Hyperlipidemia in his mother; Hypertension in his mother; Hyperthyroidism in his sister, sister, and sister; Lung cancer in his paternal aunt; Multiple sclerosis in his sister;  Pancreatic cancer in his father and paternal uncle; Stroke in his paternal uncle; Thyroid disease in his mother.    ROS:  Please see the history of present illness.   Otherwise, review of systems are positive for none.   All other systems are reviewed and negative.    PHYSICAL EXAM: VS:  BP 110/80 (BP Location: Left Arm, Patient Position: Sitting, Cuff Size: Normal)   Pulse 73   Ht '5\' 10"'  (1.778 m)   Wt 214 lb 12 oz (97.4 kg)   BMI 30.81 kg/m  , BMI Body mass index is 30.81 kg/m. GEN: Well nourished, well developed, in no acute distress  HEENT: normal  Neck: no JVD, carotid bruits, or masses Cardiac: RRR; no murmurs, rubs, or gallops,no edema  Respiratory:  clear to auscultation bilaterally, normal work of breathing GI: soft, nontender, nondistended, + BS MS: no deformity or atrophy  Skin: warm and dry, no rash Neuro:  Strength and sensation are intact Psych: euthymic mood, full affect Right radial pulse is normal with no hematoma  EKG:  EKG is ordered today. The ekg ordered today demonstrates normal sinus rhythm with left axis deviation . Old inferior infarct.  Recent Labs: 04/14/2017: ALT 38; BUN 13; Creat 0.81; Hemoglobin 16.6; Platelets 223; Potassium 4.2; Sodium 138; TSH 1.87    Lipid Panel    Component Value Date/Time   CHOL 219 (H) 04/14/2017 1137   TRIG 278 (H) 04/14/2017 1137   HDL 37 (L) 04/14/2017 1137   CHOLHDL 5.9 (H) 04/14/2017 1137   VLDL 56 (H) 04/14/2017 1137   LDLCALC 126 (H) 04/14/2017 1137      Wt Readings from Last 3 Encounters:  05/05/17 214 lb 12 oz (97.4 kg)  05/04/17 214 lb 9.6 oz (97.3 kg)  04/24/17 219 lb (99.3 kg)        PAD Screen 04/18/2017  Previous PAD dx? No  Previous surgical procedure? No  Pain with walking? No  Feet/toe relief with dangling? No  Painful, non-healing ulcers? No  Extremities discolored? No      ASSESSMENT AND PLAN:  1.  Coronary artery disease involving native coronary arteries with other forms of  angina: The plan was to treat him medically given the presence of collaterals. However, given his recent episode of chest pain, I think the best option is to proceed with PCI if OM 2 to improve ischemic burden. If he continues to have symptoms after that, RCA CTO PCI can be considered to be done within 3 months to improve the chance of success. Otherwise, continue aggressive medical therapy. I discussed PCI procedure in detail as well as risk and benefit. Continue dual antiplatelet therapy.  I am planning to refer him to cardiac rehabilitation after PCI.  2. Hyperlipidemia: Continue treatment with atorvastatin with a target LDL of less than 70. He will need a follow-up lipid and liver profile.  3. Type 2 diabetes: Likely the culprit for his diffuse coronary artery disease. Recommend a target hemoglobin A1c less than 7 and consider treatment  with an SGLT2 inhibitor given improved cardiovascular events with this class of medications.   Disposition:   FU with me in one month.  Signed,  Kathlyn Sacramento, MD  05/05/2017 3:08 PM    [facility]

## 2017-05-16 DIAGNOSIS — I209 Angina pectoris, unspecified: Secondary | ICD-10-CM | POA: Diagnosis not present

## 2017-05-16 DIAGNOSIS — I25119 Atherosclerotic heart disease of native coronary artery with unspecified angina pectoris: Secondary | ICD-10-CM | POA: Diagnosis not present

## 2017-05-16 LAB — BASIC METABOLIC PANEL
ANION GAP: 8 (ref 5–15)
BUN: 13 mg/dL (ref 6–20)
CALCIUM: 9.3 mg/dL (ref 8.9–10.3)
CO2: 25 mmol/L (ref 22–32)
Chloride: 106 mmol/L (ref 101–111)
Creatinine, Ser: 0.73 mg/dL (ref 0.61–1.24)
Glucose, Bld: 107 mg/dL — ABNORMAL HIGH (ref 65–99)
Potassium: 3.9 mmol/L (ref 3.5–5.1)
SODIUM: 139 mmol/L (ref 135–145)

## 2017-05-16 LAB — CBC
HCT: 44.2 % (ref 40.0–52.0)
HEMOGLOBIN: 15.4 g/dL (ref 13.0–18.0)
MCH: 29.5 pg (ref 26.0–34.0)
MCHC: 34.7 g/dL (ref 32.0–36.0)
MCV: 85 fL (ref 80.0–100.0)
Platelets: 209 10*3/uL (ref 150–440)
RBC: 5.2 MIL/uL (ref 4.40–5.90)
RDW: 13.1 % (ref 11.5–14.5)
WBC: 5 10*3/uL (ref 3.8–10.6)

## 2017-05-16 LAB — GLUCOSE, CAPILLARY
GLUCOSE-CAPILLARY: 110 mg/dL — AB (ref 65–99)
Glucose-Capillary: 110 mg/dL — ABNORMAL HIGH (ref 65–99)

## 2017-05-16 MED ORDER — METFORMIN HCL 500 MG PO TABS: 500.0000 mg | ORAL_TABLET | Freq: Two times a day (BID) | ORAL | 5 refills | Status: DC

## 2017-05-16 MED ORDER — METOPROLOL SUCCINATE ER 25 MG PO TB24: 25.0000 mg | ORAL_TABLET | Freq: Every day | ORAL | 5 refills | Status: DC

## 2017-05-16 NOTE — Progress Notes (Signed)
Patient Name: Stephen [name] Date of Encounter: 05/16/2017  Primary Cardiologist: [facility] Problem List     Active Problems:   Coronary artery disease   Angina pectoris (HCC)     Subjective   No acute overnight events. Tolerating medications without issues. No further chest pain. No SOB. Has ambulated without issues.   Inpatient Medications    Scheduled Meds: . aspirin EC  81 mg Oral Daily  . atorvastatin  40 mg Oral Daily  . clopidogrel  75 mg Oral Daily  . metoprolol succinate  25 mg Oral Daily  . omega-3 acid ethyl esters  2 g Oral BID  . sodium chloride flush  3 mL Intravenous Q12H   Continuous Infusions: . sodium chloride     PRN Meds: sodium chloride, acetaminophen, nitroGLYCERIN, ondansetron (ZOFRAN) IV, sodium chloride flush   Vital Signs    Vitals:   05/15/17 1430 05/15/17 1448 05/15/17 2000 05/16/17 0435  BP: 111/77 107/73 119/85 119/78  Pulse: 73 74 70 65  Resp: 18 16 18    Temp:  98.1 F (36.7 C) 98.6 F (37 C) 97.8 F (36.6 C)  TempSrc:  Oral Oral Oral  SpO2: 97% 98% 96% 99%  Weight:      Height:        Intake/Output Summary (Last 24 hours) at 05/16/17 0913 Last data filed at 05/16/17 0719  Gross per 24 hour  Intake           869.61 ml  Output              625 ml  Net           244.61 ml   Filed Weights   05/15/17 0833  Weight: 214 lb 12 oz (97.4 kg)    Physical Exam    GEN: Well nourished, well developed, in no acute distress.  HEENT: Grossly normal.  Neck: Supple, no JVD, carotid bruits, or masses. Cardiac: RRR, no murmurs, rubs, or gallops. No clubbing, cyanosis, edema.  Radials/DP/PT 2+ and equal bilaterally. Right radial cath site without bleeding, bruising, swelling, erythema, or TTP. Radial pulse 2+. Respiratory:  Respirations regular and unlabored, clear to auscultation bilaterally. GI: Soft, nontender, nondistended, BS + x 4. MS: no deformity or atrophy. Skin: warm and dry, no rash. Neuro:  Strength and sensation  are intact. Psych: AAOx3.  Normal affect.  Labs    CBC  Recent Labs  05/16/17 0621  WBC 5.0  HGB 15.4  HCT 44.2  MCV 85.0  PLT 272   Basic Metabolic Panel  Recent Labs  05/16/17 0621  NA 139  K 3.9  CL 106  CO2 25  GLUCOSE 107*  BUN 13  CREATININE 0.73  CALCIUM 9.3   Liver Function Tests No results for input(s): AST, ALT, ALKPHOS, BILITOT, PROT, ALBUMIN in the last 72 hours. No results for input(s): LIPASE, AMYLASE in the last 72 hours. Cardiac Enzymes No results for input(s): CKTOTAL, CKMB, CKMBINDEX, TROPONINI in the last 72 hours. BNP Invalid input(s): POCBNP D-Dimer No results for input(s): DDIMER in the last 72 hours. Hemoglobin A1C No results for input(s): HGBA1C in the last 72 hours. Fasting Lipid Panel No results for input(s): CHOL, HDL, LDLCALC, TRIG, CHOLHDL, LDLDIRECT in the last 72 hours. Thyroid Function Tests No results for input(s): TSH, T4TOTAL, T3FREE, THYROIDAB in the last 72 hours.  Invalid input(s): FREET3  Telemetry    NSR - Personally Reviewed  ECG    n/a - Personally Reviewed  Radiology  No results found.  Cardiac Studies   LHC 05/15/2017:  Mid RCA to Dist RCA lesion, 100 %stenosed.  Ost 3rd Mrg to 3rd Mrg lesion, 100 %stenosed.  Ost 2nd Diag to 2nd Diag lesion, 60 %stenosed.  Prox Cx lesion, 30 %stenosed.  2nd Mrg-2 lesion, 30 %stenosed.  2nd Mrg-1 lesion, 85 %stenosed.  Post intervention, there is a 0% residual stenosis.  A stent was successfully placed.   Successful angioplasty and drug-eluting stent placement to large OM 2.  Recommendations: Dual antiplatelet therapy for at least 6 months. Aggressive treatment of risk factors. I added small dose Toprol.  Patient Profile     41 y.o. male with history of recently diagnosed CAD detailed below, DM2 with hgb A1c of 10.2, prior tobacco abuse quitting ~ 25 years prior, and HLD who presented to [facility] on 10/1 for planned PCI of OM2.  Assessment & Plan    1.  CAD: -Successful PCI of OM2 -No recurrent chest pain -Continue DAPT with ASA 81 mg daily and Plavix 75 mg daily for at least the next 6 months -Add Toprol XL -Lipitor as below -Cardiac rehab  2. HLD: -Lipitor  3. DM2: -Continue PTA medications -Follow up with PCP  Signed, Christell Faith, PA-C [place] Pager: [phone] 05/16/2017, 9:13 AM

## 2017-05-16 NOTE — Discharge Summary (Signed)
Discharge Summary    Patient ID: Stephen [name]  MRN: [id number], DOB/AGE: 12/16/1975 41 y.o.  Admit Date: 05/15/2017 Discharge Date: 05/16/2017  Primary Care Provider: Mikey College, NP Primary Cardiologist: Dr. Fletcher Anon, MD  Discharge Diagnoses    Active Problems:   Coronary artery disease   Angina pectoris ([place])   Allergies No Known Allergies   History of Present Illness     41 year old male with recently diagnosed CAD detailed below, DM2 with hgb A1c of 10.2, prior tobacco abuse quitting ~ 25 years prior, and HLD who presented to [facility] on 10/1 for planned PCI of OM2 as detailed below.   He was initially seen in the office on 9/4 for evaluation of chest pain in early August 2018 with associated exertional dyspnea. 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 He underwent treadmill Myoview on 04/19/2017 that showed a large region of ischemia in the basal to apical inferior wall. Mild hypokinesis of the inferior wall. EF 69%. No EKG changes concerning for ischemia at peak stress or in recovery. Target heart rate was achieved with a peak of 171 bpm, 12.5 METs, and exercise duration of 10 minutes and 30 seconds. Overall, this was a high-risk scan. He underwent echo on 04/18/2017 that showed EF 55-60%, normal wall motion, normal LV diastolic function, no significant valvular abnormalities. He underwent diagnostic LHC on 04/24/2017 that showed significant underlying 3-vessel CAD as detailed below. The culprit for the abnormal stress test was felt to be subacute thrombotic occlusion of the mid to distal RCA which was noted to have left-to-right collaterals. OM3 was occluded with left-to-left collaterals. There was significant stenosis in OM2 and moderate disease in D2. The LAD was mildly and diffusely diseased. Normal LV systolic function with high-normal LVEDP. It was initially recommended the patient be treated medically with PCI of OM2 for refractory angina. [name] LAD  disease was not significant and did not warrant CABG. He was seen in the office in follow up on 9/21 and noted an episode of prolonged angina. Given his recurrence of chest pain, it was decided he should undergo PCI of OM2 to improve ischemic burden. If he continues to have symptoms after that, RCA CTO PCI can be considered to be done within 3 months to improve the chance of success.    Hospital Course     Consultants: none  The patient presented to [facility] for outpatient planned PCI on 10/1. He underwent LHC on 10/1 with successful PCI/DES with a Resolute Onyx DES 2.75 x 26 mm without complications. He will be continued on DAPT with ASA 81 mg and Plavix 75 mg daily for at least 6 months. Toprol was added. Aggressive risk factor modification was advised. Post cardiac cath labs were stable. He has ambulated without issues. No further chest pain.   Metformin will be held for 48 hours post LHC.   [name] right radial cath site has been examined is healing well without issues at this time. The patient has been seen by Dr. Saunders Revel and felt to be stable for discharge today. All follow up appointments have been made. Discharge medications are listed below. Prescriptions have been reviewed with the patient and sent in to their pharmacy.  _____________  Discharge Vitals Blood pressure 119/78, pulse 65, temperature 97.8 F (36.6 C), temperature source Oral, resp. rate 18, height '5\' 10"'  (1.778 m), weight 214 lb 12 oz (97.4 kg), SpO2 99 %.  Filed Weights   05/15/17 [phone]  Weight: 214 lb 12 oz (97.4 kg)    Labs & Radiologic Studies    CBC  Recent Labs  05/16/17 0621  WBC 5.0  HGB 15.4  HCT 44.2  MCV 85.0  PLT 662   Basic Metabolic Panel  Recent Labs  05/16/17 0621  NA 139  K 3.9  CL 106  CO2 25  GLUCOSE 107*  BUN 13  CREATININE 0.73  CALCIUM 9.3   Liver Function Tests No results for input(s): AST, ALT, ALKPHOS, BILITOT, PROT, ALBUMIN in the last 72 hours. No results for input(s):  LIPASE, AMYLASE in the last 72 hours. Cardiac Enzymes No results for input(s): CKTOTAL, CKMB, CKMBINDEX, TROPONINI in the last 72 hours. BNP Invalid input(s): POCBNP D-Dimer No results for input(s): DDIMER in the last 72 hours. Hemoglobin A1C No results for input(s): HGBA1C in the last 72 hours. Fasting Lipid Panel No results for input(s): CHOL, HDL, LDLCALC, TRIG, CHOLHDL, LDLDIRECT in the last 72 hours. Thyroid Function Tests No results for input(s): TSH, T4TOTAL, T3FREE, THYROIDAB in the last 72 hours.  Invalid input(s): FREET3 _____________  Nm Myocar Multi W/spect W/wall Motion / Ef  Result Date: 04/19/2017 Exercise myocardial perfusion imaging study with large region of ischemia in the basal to apical inferior wall. Mild hypokinesis of the inferior wall, EF estimated at 69% No EKG changes concerning for ischemia at peak stress or in recovery. Target heart rate achieved, peak HR 171 bpm, 12.5 METS, 10:30 min High risk scan Signed, Esmond Plants, MD, Ph.D [facility]    Diagnostic Studies/Procedures   LHC 05/15/2017: Coronary Findings   Dominance: Right  Left Main  Vessel is angiographically normal.  Left Anterior Descending  There is mild the vessel.  Second Diagonal [employer] 2nd Diag to 2nd Diag lesion, 60% stenosed.  Left Circumflex  Prox Cx lesion, 30% stenosed.  First Obtuse Marginal Branch  Vessel is small in size. There is mild disease in the vessel.  Second Obtuse Marginal Branch  2nd Mrg-1 lesion, 85% stenosed.  Angioplasty: A stent was successfully placed. The pre-interventional distal flow is normal (TIMI 3). The post-interventional distal flow is normal (TIMI 3). The intervention was successful . No complications occurred at this lesion.  There is no residual stenosis post intervention.  2nd Mrg-2 lesion, 30% stenosed.  Third Obtuse Marginal Branch  3rd Mrg filled by collaterals from 3rd Sept.  Ost 3rd Mrg to 3rd Mrg lesion, 100% stenosed.  Right Coronary  Artery  Mid RCA to Dist RCA lesion, 100% stenosed.  Right Posterior Atrioventricular Branch  Post Atrio filled by collaterals from Dist Cx.  Coronary Diagrams   Diagnostic Diagram       Post-Intervention Diagram         _____________  Disposition   Pt is being discharged home today in good condition.  Follow-up Plans & Appointments    Follow-up Information    Wellington Hampshire, MD Follow up on 06/02/2017.   Specialty:  Cardiology Why:  Appointment time: 3:20 PM Contact information: [place] [address] [phone]          Discharge Instructions    AMB Referral to Cardiac Rehabilitation - Phase II    Complete by:  As directed    Diagnosis:   Stable Angina Coronary Stents     Call MD for:  difficulty breathing, headache or visual disturbances    Complete by:  As directed    Call MD for:  extreme fatigue  Complete by:  As directed    Call MD for:  hives    Complete by:  As directed    Call MD for:  persistant dizziness or light-headedness    Complete by:  As directed    Call MD for:  persistant nausea and vomiting    Complete by:  As directed    Call MD for:  redness, tenderness, or signs of infection (pain, swelling, redness, odor or green/yellow discharge around incision site)    Complete by:  As directed    Call MD for:  severe uncontrolled pain    Complete by:  As directed    Call MD for:  temperature >100.4    Complete by:  As directed    Diet - low sodium heart healthy    Complete by:  As directed    Increase activity slowly    Complete by:  As directed       Discharge Medications      Medication List    TAKE these medications   acetaminophen 500 MG tablet Commonly known as:  TYLENOL Take 500-1,000 mg by mouth every 6 (six) hours as needed (for pain/headaches.).   aspirin EC 81 MG tablet Take 1 tablet (81 mg total) by mouth daily. What changed:  when to take this   atorvastatin 40 MG  tablet Commonly known as:  LIPITOR Take 1 tablet (40 mg total) by mouth daily.   blood glucose meter kit and supplies Kit Dispense based on patient and insurance preference. Use up to four times daily as directed. (FOR ICD-9 250.00, 250.01).   clopidogrel 75 MG tablet Commonly known as:  PLAVIX Take 1 tablet (75 mg total) by mouth daily.   Icosapent Ethyl 1 g Caps Commonly known as:  VASCEPA Take 2 g by mouth 2 (two) times daily.   ICY HOT EX Apply 1 application topically 2 (two) times daily as needed (for back pain.).   metFORMIN 500 MG tablet Commonly known as:  GLUCOPHAGE Take 1 tablet (500 mg total) by mouth 2 (two) times daily with a meal. Restart on evening of 05/17/2017 What changed:  additional instructions   metoprolol succinate 25 MG 24 hr tablet Commonly known as:  TOPROL-XL Take 1 tablet (25 mg total) by mouth daily.   nitroGLYCERIN 0.4 MG SL tablet Commonly known as:  NITROSTAT Place 1 tablet (0.4 mg total) under the tongue every 5 (five) minutes as needed for chest pain.        Aspirin prescribed at discharge?  Yes High Intensity Statin Prescribed? (Lipitor 40-47m or Crestor 20-427m: Yes Beta Blocker Prescribed? Yes For EF <40%, was ACEI/ARB Prescribed? No: EF > 40% ADP Receptor Inhibitor Prescribed? (i.e. Plavix etc.-Includes Medically Managed Patients): Yes For EF <40%, Aldosterone Inhibitor Prescribed? No: EF > 40% Was EF assessed during THIS hospitalization? Yes Was Cardiac Rehab II ordered? (Included Medically managed Patients): Yes   Outstanding Labs/Studies   None.  Duration of Discharge Encounter   Greater than 30 minutes including physician time.  Signed, RyRise MuPA-C [facility] Pager: ([phone]/09/2016, 9:22 AM

## 2017-05-16 NOTE — Discharge Instructions (Signed)
Please do not restart metformin until your evening dose on 05/17/2017. Continue aspirin 81 mg and Plavix 74 mg daily without missing any doses. Continue Lipitor. We have added a medication called Toprol XL 25 mg daily. Continue to take this daily.  Follow up with your primary care physician for routine diabetes checks.    Coronary Angiogram With Stent Coronary angiogram with stent placement is a procedure to widen or open a narrow blood vessel of the heart (coronary artery). Arteries may become blocked by cholesterol buildup (plaques) in the lining or wall. When a coronary artery becomes partially blocked, blood flow to that area decreases. This may lead to chest pain or a heart attack (myocardial infarction). A stent is a small piece of metal that looks like mesh or a spring. Stent placement may be done as treatment for a heart attack or right after a coronary angiogram in which a blocked artery is found. Let your health care provider know about:  Any allergies you have.  All medicines you are taking, including vitamins, herbs, eye drops, creams, and over-the-counter medicines.  Any problems you or family members have had with anesthetic medicines.  Any blood disorders you have.  Any surgeries you have had.  Any medical conditions you have.  Whether you are pregnant or may be pregnant. What are the risks? Generally, this is a safe procedure. However, problems may occur, including:  Damage to the heart or its blood vessels.  A return of blockage.  Bleeding, infection, or bruising at the insertion site.  A collection of blood under the skin (hematoma) at the insertion site.  A blood clot in another part of the body.  Kidney injury.  Allergic reaction to the dye or contrast that is used.  Bleeding into the abdomen (retroperitoneal bleeding).  What happens before the procedure? Staying hydrated Follow instructions from your health care provider about hydration, which  may include:  Up to 2 hours before the procedure - you may continue to drink clear liquids, such as water, clear fruit juice, black coffee, and plain tea.  Eating and drinking restrictions Follow instructions from your health care provider about eating and drinking, which may include:  8 hours before the procedure - stop eating heavy meals or foods such as meat, fried foods, or fatty foods.  6 hours before the procedure - stop eating light meals or foods, such as toast or cereal.  2 hours before the procedure - stop drinking clear liquids.  Ask your health care provider about:  Changing or stopping your regular medicines. This is especially important if you are taking diabetes medicines or blood thinners.  Taking medicines such as ibuprofen. These medicines can thin your blood. Do not take these medicines before your procedure if your health care provider instructs you not to. Generally, aspirin is recommended before a procedure of passing a small, thin tube (catheter) through a blood vessel and into the heart (cardiac catheterization).  What happens during the procedure?  An IV tube will be inserted into one of your veins.  You will be given one or more of the following: ? A medicine to help you relax (sedative). ? A medicine to numb the area where the catheter will be inserted into an artery (local anesthetic).  To reduce your risk of infection: ? Your health care team will wash or sanitize their hands. ? Your skin will be washed with soap. ? Hair may be removed from the area where the catheter will be  inserted.  Using a guide wire, the catheter will be inserted into an artery. The location may be in your groin, in your wrist, or in the fold of your arm (near your elbow).  A type of X-ray (fluoroscopy) will be used to help guide the catheter to the opening of the arteries in the heart.  A dye will be injected into the catheter, and X-rays will be taken. The dye will help to show  where any narrowing or blockages are located in the arteries.  A tiny wire will be guided to the blocked spot, and a balloon will be inflated to make the artery wider.  The stent will be expanded and will crush the plaques into the wall of the vessel. The stent will hold the area open and improve the blood flow. Most stents have a drug coating to reduce the risk of the stent narrowing over time.  The artery may be made wider using a drill, laser, or other tools to remove plaques.  When the blood flow is better, the catheter will be removed. The lining of the artery will grow over the stent, which stays where it was placed. This procedure may vary among health care providers and hospitals. What happens after the procedure?  If the procedure is done through the leg, you will be kept in bed lying flat for about 6 hours. You will be instructed to not bend and not cross your legs.  The insertion site will be checked frequently.  The pulse in your foot or wrist will be checked frequently.  You may have additional blood tests, X-rays, and a test that records the electrical activity of your heart (electrocardiogram, or ECG). This information is not intended to replace advice given to you by your health care provider. Make sure you discuss any questions you have with your health care provider. Document Released: 02/05/2003 Document Revised: 03/31/2016 Document Reviewed: 03/06/2016 Elsevier Interactive Patient Education  2017 [employer].

## 2017-05-16 NOTE — Progress Notes (Addendum)
Patient referred to Cardiac Rehab by Dr. Fletcher Anon with dx of Stable Angina and S/P Coronary Stents.  Patient recently diagnosed with CAD and DM II.  Patient with prior hx of smoking and reports quitting approximately 25 years ago.  Patient also with HLD.  Patient admitted after planned PCI on 05/15/2017.  DES to OM2.  "Angioplasty and Stents" booklet given and reviewed with patient.   Discussed with patient and significant other risk factor modifications including heart healthy / carb modified diet, controlling blood sugar, controlling HTN, controlling HLD, weight control, and exercise.  Patient is scheduled to attend outpatient diabetes education in the next couple of weeks. Explained the Cardiac Rehab program to patient and significant other.  Patient and significant other asked pertinent questions, but felt that Cardiac Rehab was a huge time commitment.  Patient would like to go to his follow-up appointment with Dr. Fletcher Anon and discuss it further with him before making a decision.  Patient stated he will think about Cardiac Rehab and will contact us if he wants to participate.  I explained to the patient the order is good for a year. Patient also voiced concerns about ongoing chest pain, as he is uncertain if this stent will alleviate all of his chest pain.   Again, the patient stated he would contact Cardiac Rehab department if he decides to participate in the program. [facility] Cardiac Rehab brochure and introductory letter given to patient along with the phone number to Cardiac Rehab.    Roanna Epley, RN, BSN, [facility] Cardiovascular and Pulmonary Nurse Navigator

## 2017-05-16 NOTE — Progress Notes (Signed)
Discharge instructions explained to pt/ verbalized an understanding/ iv and tele removed/ ambulated around nursing station/ tolerated well/ will transport off unit via wheelchair when ride arrives.

## 2017-05-16 NOTE — Plan of Care (Signed)
Problem: Education: Goal: Knowledge of [place] [facility] Education information/materials will improve Outcome: Completed/Met Date Met: 05/16/17 No complaints of pain this shift. Pt had a PCI done yesterday, through the right wrist. Site is at a level 0, no bleeding, bruising, pain at site. Up independently in room tolerating well.

## 2017-05-17 ENCOUNTER — Encounter: Payer: Self-pay | Admitting: Nurse Practitioner

## 2017-05-17 ENCOUNTER — Ambulatory Visit (INDEPENDENT_AMBULATORY_CARE_PROVIDER_SITE_OTHER): Payer: BLUE CROSS/BLUE SHIELD | Admitting: Nurse Practitioner

## 2017-05-17 ENCOUNTER — Other Ambulatory Visit: Payer: Self-pay | Admitting: Nurse Practitioner

## 2017-05-17 VITALS — BP 104/64 | HR 81 | Temp 98.5°F | Wt 203.6 lb

## 2017-05-17 DIAGNOSIS — E782 Mixed hyperlipidemia: Secondary | ICD-10-CM | POA: Diagnosis not present

## 2017-05-17 DIAGNOSIS — R200 Anesthesia of skin: Secondary | ICD-10-CM

## 2017-05-17 DIAGNOSIS — E119 Type 2 diabetes mellitus without complications: Secondary | ICD-10-CM

## 2017-05-17 DIAGNOSIS — R202 Paresthesia of skin: Secondary | ICD-10-CM | POA: Diagnosis not present

## 2017-05-17 DIAGNOSIS — I2510 Atherosclerotic heart disease of native coronary artery without angina pectoris: Secondary | ICD-10-CM

## 2017-05-17 NOTE — Assessment & Plan Note (Signed)
Pt w/ known CAD s/p PCI/stent placement to OM2.  Pt w/ initial LHC and persistent stable angina returned for repeat LHC and PCI on 10/1.  He has not had any angina since PCI, but has not had return to physical activity.  Plan: 1. Continue medications guided by Cardiology.  Maintain regular follow up. 2. Reviewed nitroglycerin use.  Pt verbalizes understanding. 3. Follow up as needed w/ PCP.

## 2017-05-17 NOTE — Progress Notes (Signed)
Subjective:    Patient ID: Stephen [name], male    DOB: Mar 31, 1976, 41 y.o.   MRN: [id number]  Stephen [name] is a 41 y.o. male presenting on 05/17/2017 for Hospitalization Follow-up (CAD. pt had cardiac stent placed on Monday, Oct 1st)   HPI Hospital Follow Up: CAD w/ repeat LHC and PCI to [place] Admission Date: 05/15/2017 Discharge Date: 05/16/2017 Diagnosis: CAD, Angina Follow up recommendations: Continue w/ Cardiology Interval history: Has been feeling well w/o any angina.  Has not yet done any exercise, however.  Diabetes Mellitus Type 2 Pt presents today for follow up of Type 2 diabetes mellitus.  He is checking fasting am CBG at home with a range of 192-105  Last 2 weeks average 116   - Current diabetic medications include: metformin  - He is not currently symptomatic.  - He denies polydipsia, polyphagia, polyuria, headaches, diaphoresis, shakiness, chills, pain, numbness or tingling in extremities and changes in vision.   - Clinical course has been improving. - He  reports an exercise routine that includes gym or walking for 30-45 minutes, 3 days per week. - His diet is moderate in salt, moderate in fat, and low in carbohydrates. - Weight trend: decreasing steadily  PREVENTION: Eye exam current (within one year): no - Requested. Foot exam current (within one year): no - due today Lipid/ASCVD risk reduction - on statin: atorvastatin Kidney protection - on ace or arb: no - urine microalbumin due today.  Recent Labs  04/14/17 1137  HGBA1C 10.2*   Hyperlipidemia Pt has started atorvastatin 40 mg once daily and is tolerating well w/o side effects.  Has made lifestyle changes as above.   Pt denies headache, lightheadedness, dizziness, changes in vision, palpitations, leg swelling, sudden loss of speech or loss of consciousness.   Numbness in Extremities Feels like arms are going numb at night, moves and goes away.  Only when sleeping (on side) or crossing arms.  This goes away  when he moves them.   - Has worked at [employer] job for many years and uses smartphone w/ forward bend of head regularly. - Has no pain in neck, arms or any other numbness.  Depression screen: Has had depression after initial diabetes diagnosis and visit 04-20-17.  Admits to having several days w/ very down and depressed moods, but he responded w/ reading about diagnosis and making lifestyle changes.  Currently denies being down, depressed, or hopeless. Depression screen [facility] 2/9 05/17/2017 05/04/2017 04/20/2017 04/14/2017  Decreased Interest 0 0 0 0  Down, Depressed, Hopeless 0 0 0 0  PHQ - 2 Score 0 0 0 0   Social History  Substance Use Topics  . Smoking status: Former Smoker    Packs/day: 0.10    Years: 3.00    Types: Cigarettes    Quit date: 08/15/1994  . Smokeless tobacco: Never Used     Comment: weekend smoker, pt would only smoke bout 1 -2 cig on the weekend.   . Alcohol use 1.8 oz/week    3 Cans of beer per week     Comment: on week-ends   Review of Systems Per HPI unless specifically indicated above     Objective:    BP 104/64 (BP Location: Left Arm, Patient Position: Sitting, Cuff Size: Normal)   Pulse 81   Temp 98.5 F (36.9 C) (Oral)   Wt 203 lb 9.6 oz (92.4 kg)   BMI 29.21 kg/m   Wt Readings from Last 3 Encounters:  05/17/17 203 lb 9.6  oz (92.4 kg)  05/15/17 214 lb 12 oz (97.4 kg)  05/05/17 214 lb 12 oz (97.4 kg)    Physical Exam  General - overweight, well-appearing, NAD HEENT - Normocephalic, atraumatic Neck - supple, non-tender, no LAD, no thyromegaly, no carotid bruit Heart - RRR, no murmurs heard Lungs - Clear throughout all lobes, no wheezing, crackles, or rhonchi. Normal work of breathing. Extremeties - non-tender, no edema, cap refill < 2 seconds, peripheral pulses intact +2 bilaterally Skin - warm, dry Neuro - awake, alert, oriented x3, normal gait Psych - Normal mood and affect, normal behavior   Diabetic Foot Exam - Simple   Simple Foot  Form Diabetic Foot exam was performed with the following findings:  Yes 05/17/2017  8:45 AM  Visual Inspection No deformities, no ulcerations, no other skin breakdown bilaterally:  Yes Sensation Testing Intact to touch and monofilament testing bilaterally:  Yes Pulse Check Posterior Tibialis and Dorsalis pulse intact bilaterally:  Yes Comments     Results for orders placed or performed during the hospital encounter of 05/15/17  Glucose, capillary  Result Value Ref Range   Glucose-Capillary 118 (H) 65 - 99 mg/dL  Basic metabolic panel  Result Value Ref Range   Sodium 139 135 - 145 mmol/L   Potassium 3.9 3.5 - 5.1 mmol/L   Chloride 106 101 - 111 mmol/L   CO2 25 22 - 32 mmol/L   Glucose, Bld 107 (H) 65 - 99 mg/dL   BUN 13 6 - 20 mg/dL   Creatinine, Ser 0.73 0.61 - 1.24 mg/dL   Calcium 9.3 8.9 - 10.3 mg/dL   GFR calc non Af Amer >60 >60 mL/min   GFR calc Af Amer >60 >60 mL/min   Anion gap 8 5 - 15  CBC  Result Value Ref Range   WBC 5.0 3.8 - 10.6 K/uL   RBC 5.20 4.40 - 5.90 MIL/uL   Hemoglobin 15.4 13.0 - 18.0 g/dL   HCT 44.2 40.0 - 52.0 %   MCV 85.0 80.0 - 100.0 fL   MCH 29.5 26.0 - 34.0 pg   MCHC 34.7 32.0 - 36.0 g/dL   RDW 13.1 11.5 - 14.5 %   Platelets 209 150 - 440 K/uL  Glucose, capillary  Result Value Ref Range   Glucose-Capillary 110 (H) 65 - 99 mg/dL  Glucose, capillary  Result Value Ref Range   Glucose-Capillary 110 (H) 65 - 99 mg/dL  POCT Activated clotting time  Result Value Ref Range   Activated Clotting Time 323 seconds      Assessment & Plan:   Problem List Items Addressed This Visit      Cardiovascular and Mediastinum   Coronary artery disease    Pt w/ known CAD s/p PCI/stent placement to OM2.  Pt w/ initial LHC and persistent stable angina returned for repeat LHC and PCI on 10/1.  He has not had any angina since PCI, but has not had return to physical activity.  Plan: 1. Continue medications guided by Cardiology.  Maintain regular follow up. 2.  Reviewed nitroglycerin use.  Pt verbalizes understanding. 3. Follow up as needed w/ PCP.        Endocrine   Type 2 diabetes mellitus without complication, without long-term current use of insulin ([place]) - Primary    Significantly improved w/ home CBG checks on metformin 500 mg twice daily.  Currently asymptomatic.   - Known CAD - Last and initial A1c 04/14/17: 10.2%  Home CBG readings for am fasting for last  2 weeks average 120.  Plan: 1. Diabetes education - completed initial consultation.  Plans to attend classes w/ next cycle in 2 weeks. 2. Continue home CBG check for fasting am blood sugar. Goal less than 120, higher than 70. 3. Reinforced low glycemic diet.  4. CONTINUE metformin 500 mg twice daily.  5. Follow up 3 months after initial A1c (around 07/17/17) with labs approximately 3 days prior to appointment. Will collect A1c, CMP,  Lipid panel.  Pt has decided he would like to have advanced lipid profile collected this time.  CardioIQ w/ inflammatory markers ordered.      Relevant Orders   POCT UA - Microalbumin     Other   Mixed hyperlipidemia    Initial diagnosis in 04/2017 now on atorvastatin 40 mg once daily and tolerating well w/o side effects.  Now making significant lifestyle improvements as well. - Known CAD. - Comorbid DMT2.  Plan: 1. Reinforced continuing his improved diet and exercise.  Reviewed diet.  2. Continue Vascepa 4 g per day at this time.  May consider reducing or transitioning to OTC fish oil supplement after next lipid panel in December. 3. Continue simvastatin 40 mg once daily.  Option remains to increase to simvastatin 80 mg once daily depending on next lipid panel. 4. Follow up December 2018.       Other Visit Diagnoses    Numbness and tingling of upper extremity       Acute and positional.  Likely related to cervical nerve compression.  Predictable pattern and not severe.  Pt is side sleeper which is contributing to symptoms.  Plan: 1. Encouraged  pt to preserve proper body mechanics/posture w/ work and smartphone use. 2. Encouraged side sleeping pillow that keeps head properly aligned.  Can try sleeping on back. 3. Follow up as needed for worsening or persistent symptoms.  Offered MRI C-spine or neurosurgery referral and pt declined today.       Follow up plan: Return in about 2 months (around 07/24/2017).  Cassell Smiles, DNP, AGPCNP-BC Adult Gerontology Primary Care Nurse Practitioner [facility] 05/17/2017, 9:43 AM

## 2017-05-17 NOTE — Assessment & Plan Note (Signed)
Significantly improved w/ home CBG checks on metformin 500 mg twice daily.  Currently asymptomatic.   - Known CAD - Last and initial A1c 04/14/17: 10.2%  Home CBG readings for am fasting for last 2 weeks average 120.  Plan: 1. Diabetes education - completed initial consultation.  Plans to attend classes w/ next cycle in 2 weeks. 2. Continue home CBG check for fasting am blood sugar. Goal less than 120, higher than 70. 3. Reinforced low glycemic diet.  4. CONTINUE metformin 500 mg twice daily.  5. Follow up 3 months after initial A1c (around 07/17/17) with labs approximately 3 days prior to appointment. Will collect A1c, CMP,  Lipid panel.  Pt has decided he would like to have advanced lipid profile collected this time.  CardioIQ w/ inflammatory markers ordered.

## 2017-05-17 NOTE — Patient Instructions (Addendum)
Ajai, Thank you for coming in to clinic today.  1. For your diabetes and cholesterol: - Impressive work! - Continue metformin 500 mg twice daily. - Continue atorvastatin 40 mg once daily. - Continue eating low glycemic diet.  - We also recommend an opthalmology eye exam yearly to determine presence or absence of retinopathy.   - Call insurance to ask about coverage for this exam.  Please schedule a follow-up appointment with Cassell Smiles, [place]. Return in about 2 months (around 07/24/2017).  If you have any other questions or concerns, please feel free to call the clinic or send a message through [place]. You may also schedule an earlier appointment if necessary.  You will receive a survey after today's visit either digitally by e-mail or paper by [employer]. Your experiences and feedback matter to [place].  Please respond so we know how we are doing as we provide care for you.   Cassell Smiles, DNP, AGNP-BC Adult Gerontology Nurse Practitioner [place]

## 2017-05-17 NOTE — Assessment & Plan Note (Signed)
Initial diagnosis in 04/2017 now on atorvastatin 40 mg once daily and tolerating well w/o side effects.  Now making significant lifestyle improvements as well. - Known CAD. - Comorbid DMT2.  Plan: 1. Reinforced continuing his improved diet and exercise.  Reviewed diet.  2. Continue Vascepa 4 g per day at this time.  May consider reducing or transitioning to OTC fish oil supplement after next lipid panel in December. 3. Continue simvastatin 40 mg once daily.  Option remains to increase to simvastatin 80 mg once daily depending on next lipid panel. 4. Follow up December 2018.

## 2017-05-29 ENCOUNTER — Ambulatory Visit: Payer: BLUE CROSS/BLUE SHIELD

## 2017-06-02 ENCOUNTER — Ambulatory Visit (INDEPENDENT_AMBULATORY_CARE_PROVIDER_SITE_OTHER): Payer: BLUE CROSS/BLUE SHIELD | Admitting: Cardiovascular Disease

## 2017-06-02 ENCOUNTER — Encounter: Payer: Self-pay | Admitting: Cardiovascular Disease

## 2017-06-02 VITALS — BP 106/76 | HR 80 | Ht 70.0 in | Wt 205.5 lb

## 2017-06-02 DIAGNOSIS — I2510 Atherosclerotic heart disease of native coronary artery without angina pectoris: Secondary | ICD-10-CM | POA: Diagnosis not present

## 2017-06-02 DIAGNOSIS — E785 Hyperlipidemia, unspecified: Secondary | ICD-10-CM

## 2017-06-02 NOTE — Progress Notes (Signed)
Cardiology Office Note   Date:  06/02/2017   ID:  Stephen [name], DOB 07/02/1976, MRN [id number]  PCP:  Mikey College, NP  Cardiologist:   Kathlyn Sacramento, MD   Chief Complaint  Patient presents with  . other    1 month f/u no complaints today is feeling "great after stent". Meds reviewed verbally with pt.      History of Present Illness: Stephen [name] is a 41 y.o. male who is here today for a follow-up visit regarding coronary artery disease.  He has known history of type 2 diabetes and hyperlipidemia.  He was seen by me in September for chest pain with abnormal EKG which showed evidence of prior inferior infarct. He underwent a nuclear stress test which showed evidence of infarct in the inferior and inferolateral area with significant peri-infarct ischemia and normal ejection fraction. Echocardiogram was normal. Cardiac catheterization  showed significant underlying 2 -vessel coronary artery disease with subacute thrombotic occlusion of the mid to distal right coronary artery with left-to-right collaterals, occluded OM 3 with left to left collaterals, significant stenosis in OM 2 and moderate disease in second diagonal. The LAD was mildly and diffusely diseased. Ejection fraction was normal. Left ventricular end-diastolic pressure was high normal. He was treated medically initially but had an episode of chest pain and thus I decided to proceed with OM to PCI. Since then, he has done extremely well with no recurrent chest pain or shortness of breath.  He is going to the gym 3 times a week and has lost almost 20 pounds since September.  Past Medical History:  Diagnosis Date  . CAD (coronary artery disease)    a. LHC 04/2017: D2 60%, pLCx 30%, OM2 85%, OM3 100%L-L collats, m-dRCA 100% L-R collats, LVEF 55-65%; b. LHC 05/15/2017: D2 60%, pLCx 30%, OM2-1 85% s/p PCI/DES, OM2-2 30%, OM3 100% L-L collats, m-dRCA 100% L-R collats    . Diabetes mellitus with complication ([place])   . History  of echocardiogram    a. 04/2017: EF 55-60%, normal wall motion, normal LV diastolic function, no significant valvular abnormalities  . Hyperlipidemia   . Kidney stones 2002, 2008   no stone eval  . Myocardial infarction [facility])     Past Surgical History:  Procedure Laterality Date  . APPENDECTOMY    . CORONARY STENT INTERVENTION N/A 05/15/2017   Procedure: CORONARY STENT INTERVENTION;  Surgeon: Wellington Hampshire, MD;  Location: [facility];  Service: Cardiovascular;  Laterality: N/A;  . LEFT HEART CATH AND CORONARY ANGIOGRAPHY Left 04/24/2017   Procedure: LEFT HEART CATH AND CORONARY ANGIOGRAPHY;  Surgeon: Wellington Hampshire, MD;  Location: [facility];  Service: Cardiovascular;  Laterality: Left;     Current Outpatient Prescriptions  Medication Sig Dispense Refill  . acetaminophen (TYLENOL) 500 MG tablet Take 500-1,000 mg by mouth every 6 (six) hours as needed (for pain/headaches.).    Marland Kitchen aspirin EC 81 MG tablet Take 1 tablet (81 mg total) by mouth daily. (Patient taking differently: Take 81 mg by mouth at bedtime. ) 90 tablet 3  . atorvastatin (LIPITOR) 40 MG tablet Take 1 tablet (40 mg total) by mouth daily. 90 tablet 3  . blood glucose meter kit and supplies KIT Dispense based on patient and insurance preference. Use up to four times daily as directed. (FOR ICD-9 250.00, 250.01). 1 each 0  . clopidogrel (PLAVIX) 75 MG tablet Take 1 tablet (75 mg total) by mouth daily. 90 tablet 3  .  Icosapent Ethyl (VASCEPA) 1 g CAPS Take 2 g by mouth 2 (two) times daily. 360 capsule 3  . Menthol, Topical Analgesic, (ICY HOT EX) Apply 1 application topically 2 (two) times daily as needed (for back pain.).    Marland Kitchen metFORMIN (GLUCOPHAGE) 500 MG tablet Take 1 tablet (500 mg total) by mouth 2 (two) times daily with a meal. Restart on evening of 05/17/2017 60 tablet 5  . metoprolol succinate (TOPROL-XL) 25 MG 24 hr tablet Take 1 tablet (25 mg total) by mouth daily. 30 tablet 5  . nitroGLYCERIN  (NITROSTAT) 0.4 MG SL tablet Place 1 tablet (0.4 mg total) under the tongue every 5 (five) minutes as needed for chest pain. 25 tablet 3   No current facility-administered medications for this visit.     Allergies:   Patient has no known allergies.    Social History:  The patient  reports that he quit smoking about 22 years ago. His smoking use included Cigarettes. He has a 0.30 pack-year smoking history. He has never used smokeless tobacco. He reports that he drinks about 1.8 oz of alcohol per week . He reports that he does not use drugs.   Family History:  [name] family history includes CAD (age of onset: 59) in his mother; Cancer in his father; Cholecystitis in his father; Chronic Renal Failure in his mother; Diabetes in his father, paternal uncle, and sister; Healthy in his sister, son, and son; Heart disease in his mother; Hyperlipidemia in his mother; Hypertension in his mother; Hyperthyroidism in his sister, sister, and sister; Lung cancer in his paternal aunt; Multiple sclerosis in his sister; Pancreatic cancer in his father and paternal uncle; Stroke in his paternal uncle; Thyroid disease in his mother.    ROS:  Please see the history of present illness.   Otherwise, review of systems are positive for none.   All other systems are reviewed and negative.    PHYSICAL EXAM: VS:  BP 106/76 (BP Location: Left Arm, Patient Position: Sitting, Cuff Size: Normal)   Pulse 80   Ht _0  (1.778 m)   Wt 205 lb 8 oz (93.2 kg)   BMI 29.49 kg/m  , BMI Body mass index is 29.49 kg/m. GEN: Well nourished, well developed, in no acute distress  HEENT: normal  Neck: no JVD, carotid bruits, or masses Cardiac: RRR; no murmurs, rubs, or gallops,no edema  Respiratory:  clear to auscultation bilaterally, normal work of breathing GI: soft, nontender, nondistended, + BS MS: no deformity or atrophy  Skin: warm and dry, no rash Neuro:  Strength and sensation are intact Psych: euthymic mood, full  affect Right radial pulse is normal with no hematoma  EKG:  EKG is not  ordered today.   Recent Labs: 04/14/2017: ALT 38; TSH 1.87 05/16/2017: BUN 13; Creatinine, Ser 0.73; Hemoglobin 15.4; Platelets 209; Potassium 3.9; Sodium 139    Lipid Panel    Component Value Date/Time   CHOL 219 (H) 04/14/2017 1137   TRIG 278 (H) 04/14/2017 1137   HDL 37 (L) 04/14/2017 1137   CHOLHDL 5.9 (H) 04/14/2017 1137   VLDL 56 (H) 04/14/2017 1137   LDLCALC 126 (H) 04/14/2017 1137      Wt Readings from Last 3 Encounters:  06/02/17 205 lb 8 oz (93.2 kg)  05/17/17 203 lb 9.6 oz (92.4 kg)  05/15/17 214 lb 12 oz (97.4 kg)        PAD Screen 04/18/2017  Previous PAD dx? No  Previous surgical procedure? No  Pain with walking? No  Feet/toe relief with dangling? No  Painful, non-healing ulcers? No  Extremities discolored? No      ASSESSMENT AND PLAN:  1.  Coronary artery disease involving native coronary arteries without angina: He is doing extremely well after recent OM to PCI with no residual anginal symptoms even with high intensity exercise.  I recommend continuing medical therapy.  He is to continue with dual antiplatelet therapy for at least 6 months.  2. Hyperlipidemia: Continue treatment with atorvastatin with a target LDL of less than 70. He is going to have a follow-up lipid profile with his primary care physician.  3. Type 2 diabetes: His lifestyle has improved significantly and I suspect that his hemoglobin A1c is much better.  Disposition:   FU with me in 6 months.  Signed,  Kathlyn Sacramento, MD  06/02/2017 3:29 PM    [place]

## 2017-06-02 NOTE — Patient Instructions (Signed)
Medication Instructions: Continue same medications.   Labwork: None.   Procedures/Testing: None.   Follow-Up: 6 months with Dr. Arida.   Any Additional Special Instructions Will Be Listed Below (If Applicable).     If you need a refill on your cardiac medications before your next appointment, please call your pharmacy.   

## 2017-06-05 ENCOUNTER — Encounter: Payer: BLUE CROSS/BLUE SHIELD | Attending: Nurse Practitioner | Admitting: Dietician

## 2017-06-05 ENCOUNTER — Encounter: Payer: Self-pay | Admitting: Dietician

## 2017-06-05 ENCOUNTER — Encounter: Payer: Self-pay | Admitting: Nurse Practitioner

## 2017-06-05 VITALS — Ht 70.0 in | Wt 204.1 lb

## 2017-06-05 DIAGNOSIS — Z713 Dietary counseling and surveillance: Secondary | ICD-10-CM | POA: Insufficient documentation

## 2017-06-05 DIAGNOSIS — E119 Type 2 diabetes mellitus without complications: Secondary | ICD-10-CM | POA: Diagnosis not present

## 2017-06-05 NOTE — Progress Notes (Signed)

## 2017-06-06 ENCOUNTER — Ambulatory Visit (INDEPENDENT_AMBULATORY_CARE_PROVIDER_SITE_OTHER): Payer: BLUE CROSS/BLUE SHIELD

## 2017-06-06 DIAGNOSIS — E119 Type 2 diabetes mellitus without complications: Secondary | ICD-10-CM

## 2017-06-06 DIAGNOSIS — R809 Proteinuria, unspecified: Secondary | ICD-10-CM

## 2017-06-06 DIAGNOSIS — Z23 Encounter for immunization: Secondary | ICD-10-CM

## 2017-06-06 DIAGNOSIS — E1129 Type 2 diabetes mellitus with other diabetic kidney complication: Secondary | ICD-10-CM

## 2017-06-06 LAB — POCT UA - MICROALBUMIN: Microalbumin Ur, POC: 50 mg/L

## 2017-06-06 MED ORDER — LISINOPRIL 2.5 MG PO TABS: 2.5000 mg | ORAL_TABLET | Freq: Every day | ORAL | 5 refills | Status: DC

## 2017-06-06 NOTE — Addendum Note (Signed)
Addended by: Cleaster Corin on: 06/06/2017 05:38 PM   Modules accepted: Orders

## 2017-06-12 ENCOUNTER — Encounter: Payer: Self-pay | Admitting: Dietician

## 2017-06-12 ENCOUNTER — Encounter: Payer: BLUE CROSS/BLUE SHIELD | Admitting: Dietician

## 2017-06-12 VITALS — Wt 203.8 lb

## 2017-06-12 DIAGNOSIS — Z713 Dietary counseling and surveillance: Secondary | ICD-10-CM | POA: Diagnosis not present

## 2017-06-12 DIAGNOSIS — E119 Type 2 diabetes mellitus without complications: Secondary | ICD-10-CM

## 2017-06-12 NOTE — Progress Notes (Signed)

## 2017-06-15 ENCOUNTER — Ambulatory Visit: Payer: Self-pay | Admitting: Cardiovascular Disease

## 2017-06-19 ENCOUNTER — Encounter: Payer: BLUE CROSS/BLUE SHIELD | Attending: Nurse Practitioner | Admitting: Dietician

## 2017-06-19 ENCOUNTER — Encounter: Payer: Self-pay | Admitting: Dietician

## 2017-06-19 VITALS — BP 108/80 | Ht 70.0 in | Wt 199.7 lb

## 2017-06-19 DIAGNOSIS — E119 Type 2 diabetes mellitus without complications: Secondary | ICD-10-CM | POA: Insufficient documentation

## 2017-06-19 DIAGNOSIS — Z713 Dietary counseling and surveillance: Secondary | ICD-10-CM | POA: Diagnosis present

## 2017-06-19 NOTE — Progress Notes (Signed)

## 2017-06-21 ENCOUNTER — Telehealth: Payer: Self-pay | Admitting: Cardiovascular Disease

## 2017-06-21 NOTE — Telephone Encounter (Signed)
Clearance for deep dental cleaning at [facility] received and placed in MD basket

## 2017-06-27 ENCOUNTER — Encounter: Payer: Self-pay | Admitting: *Deleted

## 2017-06-27 NOTE — Telephone Encounter (Signed)
Clearance to proceed with deep cleaning faxed to [facility], [phone]

## 2017-06-28 LAB — HM DIABETES EYE EXAM

## 2017-07-04 ENCOUNTER — Other Ambulatory Visit: Payer: Self-pay

## 2017-07-14 ENCOUNTER — Other Ambulatory Visit: Payer: Self-pay | Admitting: Nurse Practitioner

## 2017-07-17 ENCOUNTER — Other Ambulatory Visit: Payer: BLUE CROSS/BLUE SHIELD

## 2017-07-17 DIAGNOSIS — E119 Type 2 diabetes mellitus without complications: Secondary | ICD-10-CM

## 2017-07-17 DIAGNOSIS — E782 Mixed hyperlipidemia: Secondary | ICD-10-CM

## 2017-07-17 DIAGNOSIS — I2510 Atherosclerotic heart disease of native coronary artery without angina pectoris: Secondary | ICD-10-CM

## 2017-07-19 ENCOUNTER — Other Ambulatory Visit: Payer: Self-pay

## 2017-07-19 MED ORDER — GLUCOSE BLOOD VI STRP: 1.0000 | ORAL_STRIP | Freq: Four times a day (QID) | 3 refills | Status: AC

## 2017-07-21 LAB — CARDIO IQ(R) ADVANCED LIPID PANEL
Apolipoprotein B: 52 mg/dL (ref 52–109)
Cholesterol: 113 mg/dL (ref <200)
HDL: 42 mg/dL (ref >40)
LDL Cholesterol (Calc): 56 mg/dL (ref <100)
LDL Large: 7875 nmol/L (ref 3382–9376)
LDL Medium: 208 nmol/L (ref 122–498)
LDL Particle Number: 980 nmol/L (ref 732–2035)
LDL Peak Size: 216.8 Angstrom — ABNORMAL LOW (ref >=217)
LDL Small: 190 nmol/L (ref 85–473)
Lipoprotein (a): 18 nmol/L (ref <75)
Non-HDL Cholesterol (Calc): 71 mg/dL (calc) (ref <130)
Total CHOL/HDL Ratio: 2.7 calc (ref <5.0)
Triglycerides: 66 mg/dL (ref <150)

## 2017-07-21 LAB — HEMOGLOBIN A1C
Hgb A1c MFr Bld: 5.9 % of total Hgb — ABNORMAL HIGH (ref <5.7)
Mean Plasma Glucose: 123 (calc)
eAG (mmol/L): 6.8 (calc)

## 2017-07-21 LAB — COMPREHENSIVE METABOLIC PANEL
AG Ratio: 1.5 (calc) (ref 1.0–2.5)
ALT: 22 U/L (ref 9–46)
AST: 19 U/L (ref 10–40)
Albumin: 4.4 g/dL (ref 3.6–5.1)
Alkaline phosphatase (APISO): 101 U/L (ref 40–115)
BUN: 14 mg/dL (ref 7–25)
CO2: 28 mmol/L (ref 20–32)
Calcium: 9.8 mg/dL (ref 8.6–10.3)
Chloride: 100 mmol/L (ref 98–110)
Creat: 0.77 mg/dL (ref 0.60–1.35)
Globulin: 3 g/dL (calc) (ref 1.9–3.7)
Glucose, Bld: 107 mg/dL — ABNORMAL HIGH (ref 65–99)
Potassium: 4.2 mmol/L (ref 3.5–5.3)
Sodium: 138 mmol/L (ref 135–146)
Total Bilirubin: 0.8 mg/dL (ref 0.2–1.2)
Total Protein: 7.4 g/dL (ref 6.1–8.1)

## 2017-07-24 ENCOUNTER — Ambulatory Visit: Payer: BLUE CROSS/BLUE SHIELD | Admitting: Nurse Practitioner

## 2017-07-28 ENCOUNTER — Encounter: Payer: Self-pay | Admitting: Nurse Practitioner

## 2017-07-28 ENCOUNTER — Other Ambulatory Visit: Payer: Self-pay

## 2017-07-28 ENCOUNTER — Ambulatory Visit (INDEPENDENT_AMBULATORY_CARE_PROVIDER_SITE_OTHER): Payer: BLUE CROSS/BLUE SHIELD | Admitting: Nurse Practitioner

## 2017-07-28 VITALS — BP 104/60 | HR 65 | Temp 97.9°F | Ht 70.0 in | Wt 188.0 lb

## 2017-07-28 DIAGNOSIS — R809 Proteinuria, unspecified: Secondary | ICD-10-CM | POA: Diagnosis not present

## 2017-07-28 DIAGNOSIS — I2510 Atherosclerotic heart disease of native coronary artery without angina pectoris: Secondary | ICD-10-CM

## 2017-07-28 DIAGNOSIS — R200 Anesthesia of skin: Secondary | ICD-10-CM | POA: Insufficient documentation

## 2017-07-28 DIAGNOSIS — M545 Low back pain: Secondary | ICD-10-CM

## 2017-07-28 DIAGNOSIS — G8929 Other chronic pain: Secondary | ICD-10-CM

## 2017-07-28 DIAGNOSIS — E1129 Type 2 diabetes mellitus with other diabetic kidney complication: Secondary | ICD-10-CM | POA: Diagnosis not present

## 2017-07-28 DIAGNOSIS — R202 Paresthesia of skin: Secondary | ICD-10-CM

## 2017-07-28 MED ORDER — METOPROLOL SUCCINATE ER 25 MG PO TB24: 12.5000 mg | ORAL_TABLET | Freq: Every day | ORAL | 1 refills | Status: DC

## 2017-07-28 NOTE — Progress Notes (Signed)
Subjective:    Patient ID: Stephen [name], male    DOB: [birth year], 41 y.o.   MRN: [id number]  [name] is a 40 y.o. male presenting on 07/28/2017 for Diabetes   HPI Diabetes Pt presents today for follow up of Type 2 diabetes mellitus. He is checking fasting am CBG at home with a range of 90-105 - Current diabetic medications include: metformin - He is not currently symptomatic.  - He denies polydipsia, polyphagia, polyuria, headaches, diaphoresis, shakiness, chills, pain, numbness or tingling in extremities and changes in vision.   - Clinical course has been improving. - He  reports an exercise routine that includes running/walking intervals and weights, 3 days per week. - His diet is moderate in salt, low in fat, and low in carbohydrates. - Weight trend: decreasing rapidly (intentional weight loss w/ reduced calorie intake)  PREVENTION: Eye exam current (within one year): yes Foot exam current (within one year): yes Lipid/ASCVD risk reduction - on statin: atorvastatin Kidney protection - on ace or arb: lisinopril 2.5 mg once daily. Recent Labs    04/14/17 1137 07/17/17 0803  HGBA1C 10.2* 5.9*   CAD Pt reports occasional dizziness w/ position changes.  He notes this is only intermittent, but is concerned about this symptom.  Patient is currently taking Toprol-XL 25 mg once daily.  As noted above, he is losing weight. He denies chest pain, shortness of breath, heart palpitations, heart racing.  Numbness in arms  Patient reports numbness in arms that wakes him from sleep.  When he is lying on his right side, his left arm goes numb.  When he is lying on his left side, his right arm goes numb.  Both sides resolve within minutes after position change.  This does not occur usually during the day, but can occur with some positions.  Again this resolves within minutes to seconds. 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  He reports his monitor is at eye level and is a sit to  stand desk.  He reports mostly good posture when at his desk.  - He also uses a smart phone with forward head posture.  Low Back Pain Patient has had low back pain for 4-5 years is pretty consistent.  He reports occasional worsening with muscle spasm.  This is significantly improved since getting a new office chair, but would like to know what he may do to improve his overall symptoms.  This does not prevent him from exercising and he notes after beginning running and walking the stiffness loosens and soreness resolves.  Social History   Tobacco Use  . Smoking status: Former Smoker    Packs/day: 0.10    Years: 3.00    Pack years: 0.30    Types: Cigarettes    Last attempt to quit: 08/15/1994    Years since quitting: 23.0  . Smokeless tobacco: Never Used  . Tobacco comment: weekend smoker, pt would only smoke bout 1 -2 cig on the weekend.   Substance Use Topics  . Alcohol use: No    Comment: on week-ends  . Drug use: No    Review of Systems Per HPI unless specifically indicated above     Objective:    BP 104/60 (BP Location: Left Arm, Patient Position: Sitting, Cuff Size: Normal)   Pulse 65   Temp 97.9 F (36.6 C) (Oral)   Ht 5\' 10"  (1.778 m)   Wt 188 lb (85.3 kg)   BMI 26.98 kg/m   Wt  Readings from Last 3 Encounters:  07/28/17 188 lb (85.3 kg)  06/19/17 199 lb 11.2 oz (90.6 kg)  06/12/17 203 lb 12.8 oz (92.4 kg)    Physical Exam  General - healthy, well-appearing, NAD HEENT - Normocephalic, atraumatic Neck - supple, non-tender, no LAD, no thyromegaly, no carotid bruit Heart - RRR, no murmurs heard, no bony tenderness, normal ROM Lungs - Clear throughout all lobes, no wheezing, crackles, or rhonchi. Normal work of breathing. Extremeties - non-tender, no edema, cap refill < 2 seconds, peripheral pulses intact +2 bilaterally Musculoskeletal -  C-Spine Inspection: Normal appearance, no spinal deformity, symmetrical. Palpation: No tenderness over spinous processes.  Bilateral cervical paraspinal muscles non-tender and without hypertonicity/spasm.  Bilateral trapezius muscles non-tender and without hypertonicity/spasm ROM: Full active ROM forward flex / back extension, rotation L/R without discomfort, Lateral bending L/R without discomfort Special Testing: Spurling's test negative for pain, pincer grasp normal strength Strength: Bilateral hip flex/ext 5/5, knee flex/ext 5/5, ankle dorsiflex/plantarflex 5/5 Neurovascular: intact distal sensation to light touch  Low Back Inspection: Normal appearance, no spinal deformity, symmetrical. Palpation: No tenderness over spinous processes. Bilateral lumbar paraspinal muscles non-tender, but with hypertonicity/spasm. ROM: Full active ROM forward flex / back extension, rotation L/R without discomfort Special Testing: none Strength: Bilateral hip flex/ext 5/5, knee flex/ext 5/5, ankle dorsiflex/plantarflex 5/5 Neurovascular: intact distal sensation to light touch  Skin - warm, dry, loose/hanging skin around abdomen Neuro - awake, alert, oriented x3, normal gait Psych - Normal mood and affect, normal behavior    Results for orders placed or performed in visit on 07/17/17  Comprehensive metabolic panel  Result Value Ref Range   Glucose, Bld 107 (H) 65 - 99 mg/dL   BUN 14 7 - 25 mg/dL   Creat 0.77 0.60 - 1.35 mg/dL   BUN/Creatinine Ratio NOT APPLICABLE 6 - 22 (calc)   Sodium 138 135 - 146 mmol/L   Potassium 4.2 3.5 - 5.3 mmol/L   Chloride 100 98 - 110 mmol/L   CO2 28 20 - 32 mmol/L   Calcium 9.8 8.6 - 10.3 mg/dL   Total Protein 7.4 6.1 - 8.1 g/dL   Albumin 4.4 3.6 - 5.1 g/dL   Globulin 3.0 1.9 - 3.7 g/dL (calc)   AG Ratio 1.5 1.0 - 2.5 (calc)   Total Bilirubin 0.8 0.2 - 1.2 mg/dL   Alkaline phosphatase (APISO) 101 40 - 115 U/L   AST 19 10 - 40 U/L   ALT 22 9 - 46 U/L  Hemoglobin A1c  Result Value Ref Range   Hgb A1c MFr Bld 5.9 (H) <5.7 % of total Hgb   Mean Plasma Glucose 123 (calc)   eAG (mmol/L)  6.8 (calc)  Cardio IQ (R) Advanced Lipid Panel  Result Value Ref Range   Cholesterol 113 <200 mg/dL   HDL 42 >40 mg/dL   Triglycerides 66 <150 mg/dL   LDL Cholesterol (Calc) 56 <100 mg/dL   Total CHOL/HDL Ratio 2.7 <5.0 calc   Non-HDL Cholesterol (Calc) 71 <130 mg/dL (calc)   LDL Particle Number [id number] - 2,035 nmol/L   LDL Small 190 85 - 473 nmol/L   LDL Medium 208 122 - 498 nmol/L   LDL Large 7,875 3,382 - 9,376 nmol/L   LDL Pattern B (A) A Pattern   LDL Peak Size 216.8 (L) > OR = 217 Angstrom   Apolipoprotein B 52 52 - 109 mg/dL   Lipoprotein (a) 18 <75 nmol/L      Assessment & Plan:   Problem  List Items Addressed This Visit      Cardiovascular and Mediastinum   Coronary artery disease - Primary    Pt w/ known CAD s/p PCI/stent placement to OM2.  Pt doing well with cardiac rehab and has not had any remaining angina.  Today has symptoms of orthostasis and is in lower normotensive range.  Plan: 1. Decrease to Toprol XL 12.5 mg once daily.  Dr. Fletcher Anon notified. 2. Reviewed nitroglycerin use.  Pt verbalizes understanding. 3. Follow up as needed w/ PCP and continue regular followup w/ Cardiology.      Relevant Medications   metoprolol succinate (TOPROL-XL) 25 MG 24 hr tablet     Endocrine   Type 2 diabetes mellitus with microalbuminuria, without long-term current use of insulin (HCC)    Significantly improved A1c today with metformin only and lifestyle modification with weight loss. - Known CAD - Last and initial A1c 04/14/17: 10.2%  Today, 5.9  Plan: 1. Pt has completed diabetes education. 2. Continue home CBG check for fasting am blood sugar. Goal less than 120, higher than 70. 3. Reinforced low glycemic diet. Discussed how to increase calorie intake to reduce weight loss when at goal weight by not adding additional carbs. 4. CONTINUE metformin 500 mg twice daily.  5. Follow up 3 months         Other   Bilateral arm numbness and tingling while sleeping    Symptoms  resolve with position changes and always occur when side-lying.  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  Plan: 1. Discussed proper body mechanics to prevent stress on cervical spine.  Encouraged pt to find pillow that allows a side-lying sleeping position that does not tilt head to far off bed or is not supportive enough. 2. Can consider Xray or MRI of cervical spine in future. 3. Consider occupational health and safety personnel to evaluate work environment for proper body mechanics. 4. Followup as needed.      Chronic bilateral low back pain without sciatica    Likely related to disc degeneration without verification by Xray given chronic nature of pain vs chronic muscle tension/hypertonicity.  Plan: 1. Discussed physical therapy, exercises for back pain.  2. Can continue as needed acetaminophen. 3. Encouraged regular activity. Consider imaging in future.  Discussed utility for imaging today and pt agrees to defer imaging and referral to neurosurgeon at this time. 4. Followup as needed.         Meds ordered this encounter  Medications  . metoprolol succinate (TOPROL-XL) 25 MG 24 hr tablet    Sig: Take 0.5 tablets (12.5 mg total) by mouth daily.    Dispense:  45 tablet    Refill:  1    Do not fill until pt requests his refill.    Order Specific Question:   Supervising Provider    Answer:   Olin Hauser [[id number]]    Follow up plan: Return in about 3 months (around 10/26/2017) for Diabetes.  Cassell Smiles, DNP, AGPCNP-BC Adult Gerontology Primary Care Nurse Practitioner [facility] 08/09/2017, 1:48 PM

## 2017-07-28 NOTE — Patient Instructions (Addendum)
Stephen [name], Thank you for coming in to clinic today.  1. For diabetes: - GREAT WORK!  - Start adding more calories as you work toward weight maintenance.  Calories should be in increased vegetables, healthy whole grain carbs,  and polyunsaturated and monounsaturated fats.  2. For your neck pain: - watch your posture, locations of technology for head and arm positioning.  3. For your low back pain: - Start exercises listed below. - Consider PT in future if back pain continues.  You will be due for [place].  This means you should eat no food or drink after midnight.  Drink only water or coffee without cream/sugar on the morning of your lab visit. - Please go ahead and schedule a "Lab Only" visit in the morning at the clinic for lab draw 3-7 days before your next office visit. - Your results will be available about 2-3 days after blood draw.  If you have set up a MyChart account, you can can log in to MyChart online to view your results and a brief explanation. Also, we can discuss your results together at your next office visit if you would like.   Please schedule a follow-up appointment with Cassell Smiles, AGNP. Return in about 3 months (around 10/26/2017) for Diabetes.  If you have any other questions or concerns, please feel free to call the clinic or send a message through [place]. You may also schedule an earlier appointment if necessary.  You will receive a survey after today's visit either digitally by e-mail or paper by [employer]. Your experiences and feedback matter to [place].  Please respond so we know how we are doing as we provide care for you.  Cassell Smiles, DNP, AGNP-BC Adult Gerontology Nurse Practitioner [facility], [facility]      Low Back Pain Exercises See other page with pictures of each exercise.  Start with 1 or 2 of these exercises that you are most comfortable with. Do not do any exercises that cause you significant worsening pain. Some of these  may cause some "stretching soreness" but it should go away after you stop the exercise, and get better over time. Gradually increase up to 3-4 exercises as tolerated.  Standing hamstring stretch: Place the heel of your leg on a stool about 15 inches high. Keep your knee straight. Lean forward, bending at the hips until you feel a mild stretch in the back of your thigh. Make sure you do not roll your shoulders and bend at the waist when doing this or you will stretch your lower back instead. Hold the stretch for 15 to 30 seconds. Repeat 3 times. Repeat the same stretch on your other leg.  Cat and camel: Get down on your hands and knees. Let your stomach sag, allowing your back to curve downward. Hold this position for 5 seconds. Then arch your back and hold for 5 seconds. Do 3 sets of 10.  Quadriped Arm/Leg Raises: Get down on your hands and knees. Tighten your abdominal muscles to stiffen your spine. While keeping your abdominals tight, raise one arm and the opposite leg away from you. Hold this position for 5 seconds. Lower your arm and leg slowly and alternate sides. Do this 10 times on each side.  Pelvic tilt: Lie on your back with your knees bent and your feet flat on the floor. Tighten your abdominal muscles and push your lower back into the floor. Hold this position for 5 seconds, then relax. Do 3 sets  of 10.  Partial curl: Lie on your back with your knees bent and your feet flat on the floor. Tighten your stomach muscles and flatten your back against the floor. Tuck your chin to your chest. With your hands stretched out in front of you, curl your upper body forward until your shoulders clear the floor. Hold this position for 3 seconds. Don't hold your breath. It helps to breathe out as you lift your shoulders up. Relax. Repeat 10 times. Build to 3 sets of 10. To challenge yourself, clasp your hands behind your head and keep your elbows out to the side.  Lower trunk rotation: Lie on your back with  your knees bent and your feet flat on the floor. Tighten your abdominal muscles and push your lower back into the floor. Keeping your shoulders down flat, gently rotate your legs to one side, then the other as far as you can. Repeat 10 to 20 times.  Single knee to chest stretch: Lie on your back with your legs straight out in front of you. Bring one knee up to your chest and grasp the back of your thigh. Pull your knee toward your chest, stretching your buttock muscle. Hold this position for 15 to 30 seconds and return to the starting position. Repeat 3 times on each side.  Double knee to chest: Lie on your back with your knees bent and your feet flat on the floor. Tighten your abdominal muscles and push your lower back into the floor. Pull both knees up to your chest. Hold for 5 seconds and repeat 10 to 20 times.

## 2017-08-09 DIAGNOSIS — G8929 Other chronic pain: Secondary | ICD-10-CM | POA: Insufficient documentation

## 2017-08-09 DIAGNOSIS — M545 Low back pain: Secondary | ICD-10-CM

## 2017-08-09 NOTE — Assessment & Plan Note (Signed)
Significantly improved A1c today with metformin only and lifestyle modification with weight loss. - Known CAD - Last and initial A1c 04/14/17: 10.2%  Today, 5.9  Plan: 1. Pt has completed diabetes education. 2. Continue home CBG check for fasting am blood sugar. Goal less than 120, higher than 70. 3. Reinforced low glycemic diet. Discussed how to increase calorie intake to reduce weight loss when at goal weight by not adding additional carbs. 4. CONTINUE metformin 500 mg twice daily.  5. Follow up 3 months

## 2017-08-09 NOTE — Assessment & Plan Note (Signed)
Symptoms resolve with position changes and always occur when side-lying.  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  Plan: 1. Discussed proper body mechanics to prevent stress on cervical spine.  Encouraged pt to find pillow that allows a side-lying sleeping position that does not tilt head to far off bed or is not supportive enough. 2. Can consider Xray or MRI of cervical spine in future. 3. Consider occupational health and safety personnel to evaluate work environment for proper body mechanics. 4. Followup as needed.

## 2017-08-09 NOTE — Assessment & Plan Note (Addendum)
Pt w/ known CAD s/p PCI/stent placement to OM2.  Pt doing well with cardiac rehab and has not had any remaining angina.  Today has symptoms of orthostasis and is in lower normotensive range.  Plan: 1. Decrease to Toprol XL 12.5 mg once daily.  Dr. Fletcher Anon notified. 2. Reviewed nitroglycerin use.  Pt verbalizes understanding. 3. Follow up as needed w/ PCP and continue regular followup w/ Cardiology.

## 2017-08-09 NOTE — Assessment & Plan Note (Signed)
Likely related to disc degeneration without verification by Xray given chronic nature of pain vs chronic muscle tension/hypertonicity.  Plan: 1. Discussed physical therapy, exercises for back pain.  2. Can continue as needed acetaminophen. 3. Encouraged regular activity. Consider imaging in future.  Discussed utility for imaging today and pt agrees to defer imaging and referral to neurosurgeon at this time. 4. Followup as needed.

## 2017-08-22 ENCOUNTER — Encounter: Payer: Self-pay | Admitting: Nurse Practitioner

## 2017-08-22 DIAGNOSIS — I2510 Atherosclerotic heart disease of native coronary artery without angina pectoris: Secondary | ICD-10-CM

## 2017-08-23 MED ORDER — METOPROLOL SUCCINATE ER 25 MG PO TB24: 12.5000 mg | ORAL_TABLET | Freq: Every day | ORAL | 1 refills | Status: DC

## 2017-09-15 ENCOUNTER — Encounter: Payer: Self-pay | Admitting: Nurse Practitioner

## 2017-10-19 ENCOUNTER — Other Ambulatory Visit: Payer: Self-pay | Admitting: Nurse Practitioner

## 2017-10-19 DIAGNOSIS — R809 Proteinuria, unspecified: Secondary | ICD-10-CM

## 2017-10-19 DIAGNOSIS — E1129 Type 2 diabetes mellitus with other diabetic kidney complication: Secondary | ICD-10-CM

## 2017-10-19 DIAGNOSIS — E782 Mixed hyperlipidemia: Secondary | ICD-10-CM

## 2017-10-19 DIAGNOSIS — Z79899 Other long term (current) drug therapy: Secondary | ICD-10-CM

## 2017-10-19 DIAGNOSIS — I2510 Atherosclerotic heart disease of native coronary artery without angina pectoris: Secondary | ICD-10-CM

## 2017-10-20 ENCOUNTER — Other Ambulatory Visit: Payer: Self-pay

## 2017-10-20 DIAGNOSIS — E1129 Type 2 diabetes mellitus with other diabetic kidney complication: Secondary | ICD-10-CM

## 2017-10-20 DIAGNOSIS — I2510 Atherosclerotic heart disease of native coronary artery without angina pectoris: Secondary | ICD-10-CM

## 2017-10-20 DIAGNOSIS — E782 Mixed hyperlipidemia: Secondary | ICD-10-CM

## 2017-10-20 DIAGNOSIS — Z79899 Other long term (current) drug therapy: Secondary | ICD-10-CM

## 2017-10-20 DIAGNOSIS — R809 Proteinuria, unspecified: Secondary | ICD-10-CM

## 2017-10-21 LAB — CBC WITH DIFFERENTIAL/PLATELET
Basophils Absolute: 9 cells/uL (ref 0–200)
Basophils Relative: 0.2 %
Eosinophils Absolute: 78 cells/uL (ref 15–500)
Eosinophils Relative: 1.7 %
HCT: 42.8 % (ref 38.5–50.0)
Hemoglobin: 14.6 g/dL (ref 13.2–17.1)
Lymphs Abs: 1536 cells/uL (ref 850–3900)
MCH: 30 pg (ref 27.0–33.0)
MCHC: 34.1 g/dL (ref 32.0–36.0)
MCV: 88.1 fL (ref 80.0–100.0)
MPV: 10.1 fL (ref 7.5–12.5)
Monocytes Relative: 5.6 %
Neutro Abs: 2719 cells/uL (ref 1500–7800)
Neutrophils Relative %: 59.1 %
Platelets: 214 10*3/uL (ref 140–400)
RBC: 4.86 10*6/uL (ref 4.20–5.80)
RDW: 12.4 % (ref 11.0–15.0)
Total Lymphocyte: 33.4 %
WBC mixed population: 258 cells/uL (ref 200–950)
WBC: 4.6 10*3/uL (ref 3.8–10.8)

## 2017-10-21 LAB — COMPLETE METABOLIC PANEL WITH GFR
AG Ratio: 1.9 (calc) (ref 1.0–2.5)
ALT: 24 U/L (ref 9–46)
AST: 18 U/L (ref 10–40)
Albumin: 4.5 g/dL (ref 3.6–5.1)
Alkaline phosphatase (APISO): 70 U/L (ref 40–115)
BUN: 19 mg/dL (ref 7–25)
CO2: 28 mmol/L (ref 20–32)
Calcium: 9.6 mg/dL (ref 8.6–10.3)
Chloride: 104 mmol/L (ref 98–110)
Creat: 0.75 mg/dL (ref 0.60–1.35)
GFR, Est African American: 131 mL/min/{1.73_m2} (ref >=60)
GFR, Est Non African American: 113 mL/min/{1.73_m2} (ref >=60)
Globulin: 2.4 g/dL (calc) (ref 1.9–3.7)
Glucose, Bld: 108 mg/dL — ABNORMAL HIGH (ref 65–99)
Potassium: 4.5 mmol/L (ref 3.5–5.3)
Sodium: 140 mmol/L (ref 135–146)
Total Bilirubin: 0.8 mg/dL (ref 0.2–1.2)
Total Protein: 6.9 g/dL (ref 6.1–8.1)

## 2017-10-21 LAB — HEMOGLOBIN A1C
Hgb A1c MFr Bld: 5.8 % of total Hgb — ABNORMAL HIGH (ref <5.7)
Mean Plasma Glucose: 120 (calc)
eAG (mmol/L): 6.6 (calc)

## 2017-10-21 LAB — LIPID PANEL
Cholesterol: 134 mg/dL (ref <200)
HDL: 49 mg/dL (ref >40)
LDL Cholesterol (Calc): 71 mg/dL (calc)
Non-HDL Cholesterol (Calc): 85 mg/dL (calc) (ref <130)
Total CHOL/HDL Ratio: 2.7 (calc) (ref <5.0)
Triglycerides: 65 mg/dL (ref <150)

## 2017-10-27 ENCOUNTER — Ambulatory Visit: Payer: BLUE CROSS/BLUE SHIELD | Admitting: Nurse Practitioner

## 2017-10-27 ENCOUNTER — Encounter: Payer: Self-pay | Admitting: Nurse Practitioner

## 2017-10-27 ENCOUNTER — Other Ambulatory Visit: Payer: Self-pay

## 2017-10-27 VITALS — BP 96/57 | HR 63 | Temp 98.3°F | Ht 70.0 in | Wt 177.0 lb

## 2017-10-27 DIAGNOSIS — E782 Mixed hyperlipidemia: Secondary | ICD-10-CM | POA: Diagnosis not present

## 2017-10-27 DIAGNOSIS — R809 Proteinuria, unspecified: Secondary | ICD-10-CM

## 2017-10-27 DIAGNOSIS — E1129 Type 2 diabetes mellitus with other diabetic kidney complication: Secondary | ICD-10-CM

## 2017-10-27 DIAGNOSIS — R42 Dizziness and giddiness: Secondary | ICD-10-CM

## 2017-10-27 MED ORDER — METFORMIN HCL ER 500 MG PO TB24
500.0000 mg | ORAL_TABLET | Freq: Every day | ORAL | 3 refills | Status: DC
Start: 2017-10-27 — End: 2018-02-09

## 2017-10-27 NOTE — Progress Notes (Signed)
Subjective:    Patient ID: Stephen [name], male    DOB: Aug 08, 1976, 42 y.o.   MRN: [id number]  Stephen [name] is a 42 y.o. male presenting on 10/27/2017 for Diabetes and Dizziness (pt concern bp meds could be contributing to the dizziness.)   HPI Diabetes Pt presents today for follow up of Type 2 diabetes mellitus. He is checking fasting am CBG at home with a range of 75-105, usually 90-100 - Current diabetic medications include: metformin - He is not currently symptomatic.  - He denies polydipsia, polyphagia, polyuria, headaches, diaphoresis, shakiness, chills, pain, numbness or tingling in extremities and changes in vision.   - Clinical course has been stable. - He  reports an exercise routine that includes running/walking intervals at gym, 40 minutes 3 days per week. - His diet is moderate in salt, low in fat, and moderate in carbohydrates. - Weight trend: stable  PREVENTION: Eye exam current (within one year): yes Foot exam current (within one year): yes  Lipid/ASCVD risk reduction - on statin: yes Kidney protection - on ace or arb: yes - lisinopril 2.5 mg  Recent Labs    04/14/17 1137 07/17/17 0803 10/20/17 0805  HGBA1C 10.2* 5.9* 5.8*   Dizziness Pt reports persistent postural dizziness worst with moving from sitting to standing.  This is occurring at least 4 days per week on average.  He notes it started when he began either metoprolol or lisinopril, but cannot recall which medication.  Initially resolved, but is now becoming more frequent.  Pt is taking only 1/2 tablet metoprolol bid.   Hyperlipidemia Pt is reporting good continued work on improving diet and increasing exercise.   - Pt is noting a few dietary indiscretions in recent weeks.  States "I'm tired of eating salads." He resorts to eating meals out at [employer] frequently at lunch.  He has limited access to other healthy options outside of salads In the area. - Has recently thought about starting to  prepare lunch and carry it with him more frequently in future.   Eczema - dyshidrotic Pt has been seen and treated by dermatologist and has not had significantly improved symptoms per pt report.  He was placed on clobetasol and triamcinolone ointments.   He did not regularly apply them twice daily, but reports today that the symptoms are not continuing to be very bothersome.  He notes they flake then heal up cyclically.  Social History   Tobacco Use  . Smoking status: Former Smoker    Packs/day: 0.10    Years: 3.00    Pack years: 0.30    Types: Cigarettes    Last attempt to quit: 08/15/1994    Years since quitting: 23.2  . Smokeless tobacco: Never Used  . Tobacco comment: weekend smoker, pt would only smoke bout 1 -2 cig on the weekend.   Substance Use Topics  . Alcohol use: No    Comment: on week-ends  . Drug use: No    Review of Systems Per HPI unless specifically indicated above     Objective:    BP (!) 96/57 (BP Location: Right Arm, Patient Position: Sitting, Cuff Size: Normal)   Pulse 63   Temp 98.3 F (36.8 C)   Ht 5\' 10"  (1.778 m)   Wt 177 lb (80.3 kg)   BMI 25.40 kg/m   Wt Readings from Last 3 Encounters:  10/27/17 177 lb (80.3 kg)  07/28/17 188 lb (85.3 kg)  06/19/17 199 lb 11.2 oz (90.6 kg)  Physical Exam  General - healthy weight, well-appearing, NAD HEENT - Normocephalic, atraumatic Neck - supple, non-tender, no LAD, no carotid bruit Heart - RRR, no murmurs heard Lungs - Clear throughout all lobes, no wheezing, crackles, or rhonchi. Normal work of breathing. Extremeties - non-tender, no edema, cap refill < 2 seconds, peripheral pulses intact +2 bilaterally Skin - warm, dry.  Mildly flaky on hands, but is improved. Neuro - awake, alert, oriented x3, normal gait Psych - Normal mood and affect, normal behavior    Results for orders placed or performed in visit on 10/20/17  Lipid panel  Result Value Ref Range   Cholesterol 134 <200 mg/dL   HDL 49 >40  mg/dL   Triglycerides 65 <150 mg/dL   LDL Cholesterol (Calc) 71 mg/dL (calc)   Total CHOL/HDL Ratio 2.7 <5.0 (calc)   Non-HDL Cholesterol (Calc) 85 <130 mg/dL (calc)  Hemoglobin A1c  Result Value Ref Range   Hgb A1c MFr Bld 5.8 (H) <5.7 % of total Hgb   Mean Plasma Glucose 120 (calc)   eAG (mmol/L) 6.6 (calc)  COMPLETE METABOLIC PANEL WITH GFR  Result Value Ref Range   Glucose, Bld 108 (H) 65 - 99 mg/dL   BUN 19 7 - 25 mg/dL   Creat 0.75 0.60 - 1.35 mg/dL   GFR, Est Non African American 113 > OR = 60 mL/min/1.47m2   GFR, Est African American 131 > OR = 60 mL/min/1.54m2   BUN/Creatinine Ratio NOT APPLICABLE 6 - 22 (calc)   Sodium 140 135 - 146 mmol/L   Potassium 4.5 3.5 - 5.3 mmol/L   Chloride 104 98 - 110 mmol/L   CO2 28 20 - 32 mmol/L   Calcium 9.6 8.6 - 10.3 mg/dL   Total Protein 6.9 6.1 - 8.1 g/dL   Albumin 4.5 3.6 - 5.1 g/dL   Globulin 2.4 1.9 - 3.7 g/dL (calc)   AG Ratio 1.9 1.0 - 2.5 (calc)   Total Bilirubin 0.8 0.2 - 1.2 mg/dL   Alkaline phosphatase (APISO) 70 40 - 115 U/L   AST 18 10 - 40 U/L   ALT 24 9 - 46 U/L  CBC with Differential  Result Value Ref Range   WBC 4.6 3.8 - 10.8 Thousand/uL   RBC 4.86 4.20 - 5.80 Million/uL   Hemoglobin 14.6 13.2 - 17.1 g/dL   HCT 42.8 38.5 - 50.0 %   MCV 88.1 80.0 - 100.0 fL   MCH 30.0 27.0 - 33.0 pg   MCHC 34.1 32.0 - 36.0 g/dL   RDW 12.4 11.0 - 15.0 %   Platelets 214 140 - 400 Thousand/uL   MPV 10.1 7.5 - 12.5 fL   Neutro Abs 2,719 1,500 - 7,800 cells/uL   Lymphs Abs 1,536 850 - 3,900 cells/uL   WBC mixed population 258 200 - 950 cells/uL   Eosinophils Absolute 78 15 - 500 cells/uL   Basophils Absolute 9 0 - 200 cells/uL   Neutrophils Relative % 59.1 %   Total Lymphocyte 33.4 %   Monocytes Relative 5.6 %   Eosinophils Relative 1.7 %   Basophils Relative 0.2 %      Assessment & Plan:   Problem List Items Addressed This Visit      Endocrine   Type 2 diabetes mellitus with microalbuminuria, without long-term current  use of insulin (HCC) - Primary Well-controlled DM with A1c 5.8%. stable from prior check and goal A1c < 7.0%. - No known complications or hypoglycemia. - Complications - CAD and microalbuminuria.  Plan:  1. Change therapy: Change to Metformin ER.  Take 500 mg once daily (inconsistent and unpredictable BM).  Reduce dose for A1c target (no need to over treat). 2. Encourage improved lifestyle: - low carb/low glycemic diet reinforced prior education - Increase physical activity to 30 minutes most days of the week.  Explained that increased physical activity increases body's use of sugar for energy. 3. Check fasting am CBG.  Bring log to next visit for review 4. Continue ASA, ACEi and Statin 5. Pt is up to date on all DM health maintenance 6. Follow-up 3 months.  If continues to be stable with reduction of metformin, will extend to Q 6 mo followup.   Relevant Medications   metFORMIN (GLUCOPHAGE-XR) 500 MG 24 hr tablet     Other   Mixed hyperlipidemia  Pt without any new s/sx of ASCVD events.  Pt is continuing to adhere to lifestyle modification (diet and exercise). - Pt with known CAD and LDL pattern B on advanced lipid testing.   Plan: 1. Continue atorvastatin 40 mg once daily.  LDL remains at upper limit of goal and slightly high (LDL 71).  Reviewed dietary indiscretions and suggested to pt to continue making healthy food choices to keep LDL < 70. 2. Followup as needed.    Other Visit Diagnoses    Postural dizziness     Pt reports increasing postural dizziness despite reducing metoprolol to 1/2 tablet twice daily.  HR 63 in clinic today.    Plan: 1. STOP lisinopril 2.5 mg tablet for 2-3 weeks.  If symptoms resolve, stay off medication.  If symptoms persist, resume medication. 2. Consider reduction or cessation of metoprolol.  No change today as pt is s/p MI and stent placement.  Still within first 6 months of episode.  Will defer any changes at this time to Dr. Fletcher Anon. 3. Encouraged  adequate hydration. 4. Followup as needed and in 3 months.      Meds ordered this encounter  Medications  . metFORMIN (GLUCOPHAGE-XR) 500 MG 24 hr tablet    Sig: Take 1 tablet (500 mg total) by mouth daily with breakfast.    Dispense:  90 tablet    Refill:  3    Order Specific Question:   Supervising Provider    Answer:   Olin Hauser [[id number]]     Follow up plan: Return in about 3 months (around 01/27/2018) for diabetes.  Cassell Smiles, DNP, AGPCNP-BC Adult Gerontology Primary Care Nurse Practitioner [facility] 10/27/2017, 12:20 PM

## 2017-10-27 NOTE — Patient Instructions (Addendum)
[place], Thank you for coming in to clinic today.  1. Diabetes is going very well!!  - Trial off lisinopril 2.5 mg for next 2-3 weeks and see if that helps reduce your dizziness.  If it does not, resume taking it.  2. Dizziness is possibly caused by your beta blocker.  Continue taking metoprolol, but ask Dr. Fletcher Anon about when it could be stopped.   3. Eucerin for eczema lotion may be just as effective.  Use it twice daily.  Please schedule a follow-up appointment with Cassell Smiles, AGNP. Return in about 3 months (around 01/27/2018) for diabetes.  If you have any other questions or concerns, please feel free to call the clinic or send a message through [place]. You may also schedule an earlier appointment if necessary.  You will receive a survey after today's visit either digitally by e-mail or paper by [employer]. Your experiences and feedback matter to [place].  Please respond so we know how we are doing as we provide care for you.   Cassell Smiles, DNP, AGNP-BC Adult Gerontology Nurse Practitioner [place]

## 2017-11-01 ENCOUNTER — Encounter: Payer: Self-pay | Admitting: Nurse Practitioner

## 2017-11-01 ENCOUNTER — Other Ambulatory Visit: Payer: Self-pay | Admitting: Nurse Practitioner

## 2017-12-01 ENCOUNTER — Ambulatory Visit (INDEPENDENT_AMBULATORY_CARE_PROVIDER_SITE_OTHER): Payer: Managed Care, Other (non HMO) | Admitting: Cardiovascular Disease

## 2017-12-01 ENCOUNTER — Encounter: Payer: Self-pay | Admitting: Cardiovascular Disease

## 2017-12-01 ENCOUNTER — Other Ambulatory Visit: Payer: Self-pay | Admitting: Nurse Practitioner

## 2017-12-01 VITALS — BP 90/64 | HR 64 | Ht 70.0 in | Wt 176.5 lb

## 2017-12-01 DIAGNOSIS — I2510 Atherosclerotic heart disease of native coronary artery without angina pectoris: Secondary | ICD-10-CM

## 2017-12-01 DIAGNOSIS — E1129 Type 2 diabetes mellitus with other diabetic kidney complication: Secondary | ICD-10-CM

## 2017-12-01 DIAGNOSIS — E782 Mixed hyperlipidemia: Secondary | ICD-10-CM

## 2017-12-01 DIAGNOSIS — R809 Proteinuria, unspecified: Secondary | ICD-10-CM

## 2017-12-01 DIAGNOSIS — E119 Type 2 diabetes mellitus without complications: Secondary | ICD-10-CM

## 2017-12-01 NOTE — Progress Notes (Signed)
Cardiology Office Note   Date:  12/01/2017   ID:  Stephen [name], DOB 15-Jan-1976, MRN [id number]  PCP:  Mikey College, NP  Cardiologist:   Kathlyn Sacramento, MD   Chief Complaint  Patient presents with  . other    6 month f/u no complaints today. Meds reviewed verbally with pt.      History of Present Illness: Stephen [name] is a 42 y.o. male who is here today for a follow-up visit regarding coronary artery disease.  He has known history of type 2 diabetes and hyperlipidemia.   He had unstable angina in September 2018 with abnormal nuclear stress test.  EF was normal by echo. Cardiac catheterization  showed significant underlying 2 -vessel coronary artery disease with subacute thrombotic occlusion of the mid to distal right coronary artery with left-to-right collaterals, occluded OM 3 with left to left collaterals, significant stenosis in OM 2 and moderate disease in second diagonal. The LAD was mildly and diffusely diseased. Ejection fraction was normal. Left ventricular end-diastolic pressure was high normal. He was treated medically initially but had an episode of chest pain and thus underwent successful PCI and drug-eluting stent placement to OM. He improved his lifestyle significantly since then and managed to lose 50 pounds overall with healthy diet and exercise.  He takes his medications regularly.  He feels very well with no chest pain, shortness of breath or palpitations.  With his weight loss, his blood pressure went down and metoprolol had to be discontinued due to symptomatic hypotension.  He feels better overall.   Past Medical History:  Diagnosis Date  . CAD (coronary artery disease)    a. LHC 04/2017: D2 60%, pLCx 30%, OM2 85%, OM3 100%L-L collats, m-dRCA 100% L-R collats, LVEF 55-65%; b. LHC 05/15/2017: D2 60%, pLCx 30%, OM2-1 85% s/p PCI/DES, OM2-2 30%, OM3 100% L-L collats, m-dRCA 100% L-R collats    . Diabetes mellitus with complication ([place])   . History of  echocardiogram    a. 04/2017: EF 55-60%, normal wall motion, normal LV diastolic function, no significant valvular abnormalities  . Hyperlipidemia   . Kidney stones 2002, 2008   no stone eval  . Myocardial infarction [facility])     Past Surgical History:  Procedure Laterality Date  . APPENDECTOMY    . CORONARY STENT INTERVENTION N/A 05/15/2017   Procedure: CORONARY STENT INTERVENTION;  Surgeon: Wellington Hampshire, MD;  Location: [facility];  Service: Cardiovascular;  Laterality: N/A;  . LEFT HEART CATH AND CORONARY ANGIOGRAPHY Left 04/24/2017   Procedure: LEFT HEART CATH AND CORONARY ANGIOGRAPHY;  Surgeon: Wellington Hampshire, MD;  Location: [facility];  Service: Cardiovascular;  Laterality: Left;     Current Outpatient Medications  Medication Sig Dispense Refill  . acetaminophen (TYLENOL) 500 MG tablet Take 500-1,000 mg by mouth every 6 (six) hours as needed (for pain/headaches.).    Marland Kitchen aspirin EC 81 MG tablet Take 1 tablet (81 mg total) by mouth daily. (Patient taking differently: Take 81 mg by mouth at bedtime. ) 90 tablet 3  . atorvastatin (LIPITOR) 40 MG tablet Take 1 tablet (40 mg total) by mouth daily. 90 tablet 3  . blood glucose meter kit and supplies KIT Dispense based on patient and insurance preference. Use up to four times daily as directed. (FOR ICD-9 250.00, 250.01). 1 each 0  . clopidogrel (PLAVIX) 75 MG tablet Take 1 tablet (75 mg total) by mouth daily. 90 tablet 3  . Icosapent Ethyl (VASCEPA)  1 g CAPS Take 2 g by mouth 2 (two) times daily. 360 capsule 3  . lisinopril (PRINIVIL,ZESTRIL) 2.5 MG tablet TAKE 1 TABLET BY MOUTH ONCE DAILY 90 tablet 1  . metFORMIN (GLUCOPHAGE-XR) 500 MG 24 hr tablet Take 1 tablet (500 mg total) by mouth daily with breakfast. 90 tablet 3  . MICROLET LANCETS MISC USE TO CHECK GLUCOSE UP TO 4 TIMES DAILY AS DIRECTED 300 each 3  . nitroGLYCERIN (NITROSTAT) 0.4 MG SL tablet Place 1 tablet (0.4 mg total) under the tongue every 5 (five)  minutes as needed for chest pain. 25 tablet 3   No current facility-administered medications for this visit.     Allergies:   Patient has no known allergies.    Social History:  The patient  reports that he quit smoking about 23 years ago. His smoking use included cigarettes. He has a 0.30 pack-year smoking history. He has never used smokeless tobacco. He reports that he does not drink alcohol or use drugs.   Family History:  [name] family history includes CAD (age of onset: 53) in his mother; Cancer in his father; Cholecystitis in his father; Chronic Renal Failure in his mother; Diabetes in his father, paternal uncle, and sister; Healthy in his sister, son, and son; Heart disease in his mother; Hyperlipidemia in his mother; Hypertension in his mother; Hyperthyroidism in his sister, sister, and sister; Lung cancer in his paternal aunt; Multiple sclerosis in his sister; Pancreatic cancer in his father and paternal uncle; Stroke in his paternal uncle; Thyroid disease in his mother.    ROS:  Please see the history of present illness.   Otherwise, review of systems are positive for none.   All other systems are reviewed and negative.    PHYSICAL EXAM: VS:  BP 90/64 (BP Location: Left Arm, Patient Position: Sitting, Cuff Size: Normal)   Pulse 64   Ht '5\' 10"'  (1.778 m)   Wt 176 lb 8 oz (80.1 kg)   BMI 25.33 kg/m  , BMI Body mass index is 25.33 kg/m. GEN: Well nourished, well developed, in no acute distress  HEENT: normal  Neck: no JVD, carotid bruits, or masses Cardiac: RRR; no murmurs, rubs, or gallops,no edema  Respiratory:  clear to auscultation bilaterally, normal work of breathing GI: soft, nontender, nondistended, + BS MS: no deformity or atrophy  Skin: warm and dry, no rash Neuro:  Strength and sensation are intact Psych: euthymic mood, full affect  EKG:  EKG is  ordered today. EKG showed normal sinus rhythm with no significant ST or T wave changes.  Recent  Labs: 04/14/2017: TSH 1.87 10/20/2017: ALT 24; BUN 19; Creat 0.75; Hemoglobin 14.6; Platelets 214; Potassium 4.5; Sodium 140    Lipid Panel    Component Value Date/Time   CHOL 134 10/20/2017 0805   TRIG 65 10/20/2017 0805   HDL 49 10/20/2017 0805   CHOLHDL 2.7 10/20/2017 0805   VLDL 56 (H) 04/14/2017 1137   LDLCALC 71 10/20/2017 0805      Wt Readings from Last 3 Encounters:  12/01/17 176 lb 8 oz (80.1 kg)  10/27/17 177 lb (80.3 kg)  07/28/17 188 lb (85.3 kg)        PAD Screen 04/18/2017  Previous PAD dx? No  Previous surgical procedure? No  Pain with walking? No  Feet/toe relief with dangling? No  Painful, non-healing ulcers? No  Extremities discolored? No      ASSESSMENT AND PLAN:  1.  Coronary artery disease involving native coronary  arteries without angina: He is doing very well without angina.  Continue medical therapy.  The plan is to keep him on Plavix until October of this year.  2. Hyperlipidemia: Continue treatment with atorvastatin with a target LDL of less than 70.  Most recent lipid profile showed an LDL of 71.  Triglyceride was also low.  3. Type 2 diabetes: Significant improvement in hemoglobin A1c from above 10 to 5.8.  Disposition:   FU with me in 6 months.  Signed,  Kathlyn Sacramento, MD  12/01/2017 2:36 PM    [facility]

## 2017-12-01 NOTE — Patient Instructions (Signed)
Medication Instructions: Continue same medications.   Labwork: None.   Procedures/Testing: None.   Follow-Up: 6 months with Dr. Arida.   Any Additional Special Instructions Will Be Listed Below (If Applicable).     If you need a refill on your cardiac medications before your next appointment, please call your pharmacy.   

## 2018-01-22 ENCOUNTER — Encounter: Payer: Self-pay | Admitting: Nurse Practitioner

## 2018-01-26 ENCOUNTER — Ambulatory Visit: Payer: Managed Care, Other (non HMO) | Admitting: Nurse Practitioner

## 2018-02-01 ENCOUNTER — Telehealth: Payer: Self-pay | Admitting: Nurse Practitioner

## 2018-02-01 DIAGNOSIS — R809 Proteinuria, unspecified: Principal | ICD-10-CM

## 2018-02-01 DIAGNOSIS — E1129 Type 2 diabetes mellitus with other diabetic kidney complication: Secondary | ICD-10-CM

## 2018-02-01 NOTE — Telephone Encounter (Signed)
Pt needs a refill on metformin sent to [facility] in [place].

## 2018-02-09 ENCOUNTER — Ambulatory Visit: Payer: Managed Care, Other (non HMO) | Admitting: Nurse Practitioner

## 2018-02-09 ENCOUNTER — Encounter: Payer: Self-pay | Admitting: Nurse Practitioner

## 2018-02-09 ENCOUNTER — Other Ambulatory Visit: Payer: Self-pay

## 2018-02-09 VITALS — BP 100/57 | HR 70 | Temp 98.5°F | Ht 70.0 in | Wt 174.6 lb

## 2018-02-09 DIAGNOSIS — E1129 Type 2 diabetes mellitus with other diabetic kidney complication: Secondary | ICD-10-CM | POA: Diagnosis not present

## 2018-02-09 DIAGNOSIS — E782 Mixed hyperlipidemia: Secondary | ICD-10-CM | POA: Diagnosis not present

## 2018-02-09 DIAGNOSIS — L301 Dyshidrosis [pompholyx]: Secondary | ICD-10-CM

## 2018-02-09 DIAGNOSIS — R42 Dizziness and giddiness: Secondary | ICD-10-CM

## 2018-02-09 DIAGNOSIS — R809 Proteinuria, unspecified: Secondary | ICD-10-CM | POA: Diagnosis not present

## 2018-02-09 LAB — POCT GLYCOSYLATED HEMOGLOBIN (HGB A1C): Hemoglobin A1C: 5.7 % — AB (ref 4.0–5.6)

## 2018-02-09 MED ORDER — ICOSAPENT ETHYL 1 G PO CAPS: 2.0000 g | ORAL_CAPSULE | Freq: Two times a day (BID) | ORAL | 3 refills | Status: DC

## 2018-02-09 MED ORDER — LISINOPRIL 2.5 MG PO TABS: 2.5000 mg | ORAL_TABLET | Freq: Every day | ORAL | 3 refills | Status: DC

## 2018-02-09 MED ORDER — ATORVASTATIN CALCIUM 40 MG PO TABS: 40.0000 mg | ORAL_TABLET | Freq: Every day | ORAL | 3 refills | Status: DC

## 2018-02-09 NOTE — Patient Instructions (Addendum)
[place],   Thank you for coming in to clinic today.  1. STOP taking metformin - Keep measuring CBG.  If > 120 for 7 consecutive days, call clinic.  We will restart metformin.   2. Referral to Dr. Nehemiah Massed dermatology for your eczema.   Please schedule a follow-up appointment with Cassell Smiles, AGNP. Return in about 3 months (around 05/12/2018) for diabetes.  If you have any other questions or concerns, please feel free to call the clinic or send a message through [place]. You may also schedule an earlier appointment if necessary.  You will receive a survey after today's visit either digitally by e-mail or paper by [employer]. Your experiences and feedback matter to [place].  Please respond so we know how we are doing as we provide care for you.   Cassell Smiles, DNP, AGNP-BC Adult Gerontology Nurse Practitioner [place]

## 2018-02-09 NOTE — Progress Notes (Signed)
Subjective:    Patient ID: Stephen [name], male    DOB: April 28, 1976, 42 y.o.   MRN: [id number]  Sevastian Witczak is a 42 y.o. male presenting on 02/09/2018 for Diabetes (blood sugar averaging 100 x 3 mths )   HPI Diabetes Pt presents today for follow up of Type 2 diabetes mellitus. He is checking CBG at home with a range of 90-110 (average 100) - Current diabetic medications include: metformin XR 500 mg once daily - He is not currently symptomatic.  - He denies polydipsia, polyphagia, polyuria, headaches, diaphoresis, shakiness, chills, pain, numbness or tingling in extremities and changes in vision.   - Clinical course has been stable. - He  reports an exercise routine that includes walking, 3-4 days per week.  He is not going to the gym as regularly with his recent move, but he is not noting any weight gain - His diet is low in salt, low to moderate in fat, and low to moderate in carbohydrates. - Weight trend: stable  PREVENTION: Eye exam current (within one year): yes Foot exam current (within one year): yes  Lipid/ASCVD risk reduction - on statin: yes Kidney protection - on ace or arb: yes Recent Labs    04/14/17 1137 07/17/17 0803 10/20/17 0805 02/09/18 1545  HGBA1C 10.2* 5.9* 5.8* 5.7*   Dyshidrotic Eczema  Patient continues to report pruritic, flaky palmar rash on hands and occasionally on soles of feet.  Hands are always worse severe than feet.  He continues to use steroid cream and Eucerin for eczema lotions without significant relief.  He desires to return to dermatology, but prefers alternative provider.  CAD with history of MI Patient continues to take Plavix, aspirin, atorvastatin 40 mg daily, fish oil 4 g daily.  He has stopped taking metoprolol per Dr. Fletcher Anon in my instructions.  His dizziness and lightheadedness have nearly completely resolved.  He only occasionally has difficulty with dizziness when making position changes very quickly.  Social History   Tobacco Use  .  Smoking status: Former Smoker    Packs/day: 0.10    Years: 3.00    Pack years: 0.30    Types: Cigarettes    Last attempt to quit: 08/15/1994    Years since quitting: 23.5  . Smokeless tobacco: Never Used  . Tobacco comment: weekend smoker, pt would only smoke bout 1 -2 cig on the weekend.   Substance Use Topics  . Alcohol use: No    Comment: on week-ends  . Drug use: No    Review of Systems Per HPI unless specifically indicated above     Objective:    BP (!) 100/57 (BP Location: Right Arm, Patient Position: Sitting, Cuff Size: Normal)   Pulse 70   Temp 98.5 F (36.9 C) (Oral)   Ht 5\' 10"  (1.778 m)   Wt 174 lb 9.6 oz (79.2 kg)   BMI 25.05 kg/m   Wt Readings from Last 3 Encounters:  02/09/18 174 lb 9.6 oz (79.2 kg)  12/01/17 176 lb 8 oz (80.1 kg)  10/27/17 177 lb (80.3 kg)    Physical Exam  Constitutional: He is oriented to person, place, and time. He appears well-developed and well-nourished. No distress.  HENT:  Head: Normocephalic and atraumatic.  Cardiovascular: Normal rate, regular rhythm, S1 normal, S2 normal, normal heart sounds and intact distal pulses.  Pulmonary/Chest: Effort normal and breath sounds normal. No respiratory distress.  Neurological: He is alert and oriented to person, place, and time.  Skin: Skin is warm  and dry. Capillary refill takes less than 2 seconds. Rash (mildly erythematous, flaky, pruritic rash bilateral palmar surfaces of hands, bilateral soles of feet) noted.  Psychiatric: He has a normal mood and affect. His behavior is normal. Judgment and thought content normal.  Vitals reviewed.    Results for orders placed or performed in visit on 02/09/18  POCT glycosylated hemoglobin (Hb A1C)  Result Value Ref Range   Hemoglobin A1C 5.7 (A) 4.0 - 5.6 %   HbA1c POC (<> result, manual entry)  4.0 - 5.6 %   HbA1c, POC (prediabetic range)  5.7 - 6.4 %   HbA1c, POC (controlled diabetic range)  0.0 - 7.0 %      Assessment & Plan:   Problem  List Items Addressed This Visit      Endocrine   Type 2 diabetes mellitus with microalbuminuria, without long-term current use of insulin ([place]) - Primary    Stable today with reduction of metformin at last visit.  He continues adhering to lifestyle modification with sustained weight loss and continues to be very engaged in self care adherence. - Known CAD w/ Hx MI - Initial A1c 04/14/17: 10.2%  Today: 5.7 with all A1c over last 9 months < 6.0%  Plan: 1. Continue home CBG check for fasting am blood sugar. Goal less than 120, higher than 70.  If > 120 x 7 consecutive days, may need to resume metformin. 2. Reinforced low glycemic diet and exercise. 3. Ophthalmology and foot exams up to date. - Continue ACE-I, statin. 4. STOP metformin 500 mg twice daily.  5. Follow up 3 months       Relevant Medications   atorvastatin (LIPITOR) 40 MG tablet   lisinopril (PRINIVIL,ZESTRIL) 2.5 MG tablet   Other Relevant Orders   POCT glycosylated hemoglobin (Hb A1C) (Completed)   COMPLETE METABOLIC PANEL WITH GFR   Lipid panel   Hemoglobin A1c   Microalbuminuria due to type 2 diabetes mellitus (HCC)    Continue ACE-I despite intermittent postural dizziness.  May trial off ACE-I in future and resume microalbumin urine screening.      Relevant Medications   atorvastatin (LIPITOR) 40 MG tablet   lisinopril (PRINIVIL,ZESTRIL) 2.5 MG tablet     Musculoskeletal and Integument   Dyshidrotic eczema    Chronic condition characterized by cyclical peeling of skin on palmar surfaces of fingertips and soles of feet.  No scabbing, or cracking.    Plan: 1. Continue steroid cream prescribed by dermatology. 2. New referral to dermatology for further treatment as topical agents are not controlling eczema. 3. Follow up as needed.      Relevant Orders   Ambulatory referral to Dermatology     Other   Mixed hyperlipidemia    Previously stable on labs 3 months ago.  Lifestyle modifications sustained.  Taking  atorvastatin and having no side effects.  No new ASCVD events.  Continue atorvastatin.  Refill meds.  Followup with labs again and office visit 3 months.      Relevant Medications   atorvastatin (LIPITOR) 40 MG tablet   Icosapent Ethyl (VASCEPA) 1 g CAPS   lisinopril (PRINIVIL,ZESTRIL) 2.5 MG tablet   Other Relevant Orders   COMPLETE METABOLIC PANEL WITH GFR   Lipid panel   Postural dizziness    Improved after stopping metoprolol, but persists and is tolerable per pt.  May consider stopping lisinopril in future.  Continue for now.  Monitor symptoms and followup prn.         Meds  ordered this encounter  Medications  . atorvastatin (LIPITOR) 40 MG tablet    Sig: Take 1 tablet (40 mg total) by mouth daily.    Dispense:  90 tablet    Refill:  3    Order Specific Question:   Supervising Provider    Answer:   Olin Hauser [[id number]]  . Icosapent Ethyl (VASCEPA) 1 g CAPS    Sig: Take 2 capsules (2 g total) by mouth 2 (two) times daily.    Dispense:  360 capsule    Refill:  3    Order Specific Question:   Supervising Provider    Answer:   Olin Hauser [[id number]]  . lisinopril (PRINIVIL,ZESTRIL) 2.5 MG tablet    Sig: Take 1 tablet (2.5 mg total) by mouth daily.    Dispense:  90 tablet    Refill:  3    Order Specific Question:   Supervising Provider    Answer:   Olin Hauser [[id number]]    Follow up plan: Return in about 3 months (around 05/12/2018) for diabetes.  Cassell Smiles, DNP, AGPCNP-BC Adult Gerontology Primary Care Nurse Practitioner [facility] 02/10/2018, 2:35 PM

## 2018-02-10 ENCOUNTER — Encounter: Payer: Self-pay | Admitting: Nurse Practitioner

## 2018-02-10 DIAGNOSIS — R42 Dizziness and giddiness: Secondary | ICD-10-CM | POA: Insufficient documentation

## 2018-02-10 NOTE — Assessment & Plan Note (Signed)
Improved after stopping metoprolol, but persists and is tolerable per pt.  May consider stopping lisinopril in future.  Continue for now.  Monitor symptoms and followup prn.

## 2018-02-10 NOTE — Assessment & Plan Note (Signed)
Continue ACE-I despite intermittent postural dizziness.  May trial off ACE-I in future and resume microalbumin urine screening.

## 2018-02-10 NOTE — Assessment & Plan Note (Signed)
Previously stable on labs 3 months ago.  Lifestyle modifications sustained.  Taking atorvastatin and having no side effects.  No new ASCVD events.  Continue atorvastatin.  Refill meds.  Followup with labs again and office visit 3 months.

## 2018-02-10 NOTE — Assessment & Plan Note (Signed)
Stable today with reduction of metformin at last visit.  He continues adhering to lifestyle modification with sustained weight loss and continues to be very engaged in self care adherence. - Known CAD w/ Hx MI - Initial A1c 04/14/17: 10.2%  Today: 5.7 with all A1c over last 9 months < 6.0%  Plan: 1. Continue home CBG check for fasting am blood sugar. Goal less than 120, higher than 70.  If > 120 x 7 consecutive days, may need to resume metformin. 2. Reinforced low glycemic diet and exercise. 3. Ophthalmology and foot exams up to date. - Continue ACE-I, statin. 4. STOP metformin 500 mg twice daily.  5. Follow up 3 months

## 2018-02-10 NOTE — Assessment & Plan Note (Signed)
Chronic condition characterized by cyclical peeling of skin on palmar surfaces of fingertips and soles of feet.  No scabbing, or cracking.    Plan: 1. Continue steroid cream prescribed by dermatology. 2. New referral to dermatology for further treatment as topical agents are not controlling eczema. 3. Follow up as needed.

## 2018-03-12 ENCOUNTER — Encounter: Payer: Self-pay | Admitting: Nurse Practitioner

## 2018-04-15 ENCOUNTER — Encounter: Payer: Self-pay | Admitting: Nurse Practitioner

## 2018-04-17 ENCOUNTER — Other Ambulatory Visit: Payer: Self-pay

## 2018-04-17 DIAGNOSIS — I2510 Atherosclerotic heart disease of native coronary artery without angina pectoris: Secondary | ICD-10-CM

## 2018-04-17 MED ORDER — CLOPIDOGREL BISULFATE 75 MG PO TABS: 75.0000 mg | ORAL_TABLET | Freq: Every day | ORAL | 1 refills | Status: DC

## 2018-05-14 ENCOUNTER — Other Ambulatory Visit: Payer: Managed Care, Other (non HMO)

## 2018-05-15 ENCOUNTER — Other Ambulatory Visit: Payer: Managed Care, Other (non HMO)

## 2018-05-15 DIAGNOSIS — R809 Proteinuria, unspecified: Principal | ICD-10-CM

## 2018-05-15 DIAGNOSIS — E782 Mixed hyperlipidemia: Secondary | ICD-10-CM

## 2018-05-15 DIAGNOSIS — E1129 Type 2 diabetes mellitus with other diabetic kidney complication: Secondary | ICD-10-CM

## 2018-05-16 LAB — COMPLETE METABOLIC PANEL WITH GFR
AG Ratio: 1.9 (calc) (ref 1.0–2.5)
ALT: 26 U/L (ref 9–46)
AST: 20 U/L (ref 10–40)
Albumin: 4.4 g/dL (ref 3.6–5.1)
Alkaline phosphatase (APISO): 76 U/L (ref 40–115)
BUN: 19 mg/dL (ref 7–25)
CO2: 28 mmol/L (ref 20–32)
Calcium: 9.5 mg/dL (ref 8.6–10.3)
Chloride: 103 mmol/L (ref 98–110)
Creat: 0.79 mg/dL (ref 0.60–1.35)
GFR, Est African American: 127 mL/min/{1.73_m2} (ref >=60)
GFR, Est Non African American: 110 mL/min/{1.73_m2} (ref >=60)
Globulin: 2.3 g/dL (calc) (ref 1.9–3.7)
Glucose, Bld: 114 mg/dL — ABNORMAL HIGH (ref 65–99)
Potassium: 4.8 mmol/L (ref 3.5–5.3)
Sodium: 139 mmol/L (ref 135–146)
Total Bilirubin: 0.8 mg/dL (ref 0.2–1.2)
Total Protein: 6.7 g/dL (ref 6.1–8.1)

## 2018-05-16 LAB — LIPID PANEL
Cholesterol: 119 mg/dL (ref <200)
HDL: 52 mg/dL (ref >40)
LDL Cholesterol (Calc): 53 mg/dL (calc)
Non-HDL Cholesterol (Calc): 67 mg/dL (calc) (ref <130)
Total CHOL/HDL Ratio: 2.3 (calc) (ref <5.0)
Triglycerides: 62 mg/dL (ref <150)

## 2018-05-16 LAB — HEMOGLOBIN A1C
Hgb A1c MFr Bld: 5.9 % of total Hgb — ABNORMAL HIGH (ref <5.7)
Mean Plasma Glucose: 123 (calc)
eAG (mmol/L): 6.8 (calc)

## 2018-05-18 ENCOUNTER — Encounter: Payer: Self-pay | Admitting: Nurse Practitioner

## 2018-05-18 ENCOUNTER — Ambulatory Visit: Payer: Managed Care, Other (non HMO) | Admitting: Nurse Practitioner

## 2018-05-18 ENCOUNTER — Other Ambulatory Visit: Payer: Self-pay

## 2018-05-18 VITALS — BP 96/60 | HR 67 | Temp 98.0°F | Ht 70.0 in | Wt 177.8 lb

## 2018-05-18 DIAGNOSIS — E782 Mixed hyperlipidemia: Secondary | ICD-10-CM

## 2018-05-18 DIAGNOSIS — Z114 Encounter for screening for human immunodeficiency virus [HIV]: Secondary | ICD-10-CM | POA: Diagnosis not present

## 2018-05-18 DIAGNOSIS — Z23 Encounter for immunization: Secondary | ICD-10-CM

## 2018-05-18 DIAGNOSIS — R809 Proteinuria, unspecified: Secondary | ICD-10-CM

## 2018-05-18 DIAGNOSIS — E1129 Type 2 diabetes mellitus with other diabetic kidney complication: Secondary | ICD-10-CM | POA: Diagnosis not present

## 2018-05-18 MED ORDER — ICOSAPENT ETHYL 1 G PO CAPS: 2.0000 g | ORAL_CAPSULE | Freq: Two times a day (BID) | ORAL | 3 refills | Status: DC

## 2018-05-18 MED ORDER — MICROLET LANCETS MISC: 3 refills | Status: DC

## 2018-05-18 MED ORDER — ATORVASTATIN CALCIUM 40 MG PO TABS: 40.0000 mg | ORAL_TABLET | Freq: Every day | ORAL | 3 refills | Status: DC

## 2018-05-18 MED ORDER — LISINOPRIL 2.5 MG PO TABS: 2.5000 mg | ORAL_TABLET | Freq: Every day | ORAL | 3 refills | Status: DC

## 2018-05-18 NOTE — Assessment & Plan Note (Signed)
Stable.  Labs checked prior to visit today.  LDL < 70 (actually <55 and meeting new EAC guideline recommendation for LDL) Lifestyle modifications sustained.  Taking atorvastatin and having no side effects.  No new ASCVD events.  Continue atorvastatin.  Refill meds.  Followup with labs again and office visit 6 months.

## 2018-05-18 NOTE — Progress Notes (Signed)
Subjective:    Patient ID: Stephen [name], male    DOB: 11/07/1975, 42 y.o.   MRN: [id number]  Lebaron Bautch is a 42 y.o. male presenting on 05/18/2018 for Diabetes   HPI Diabetes Pt presents today for follow up of Type 2 diabetes mellitus. He is checking fasting am CBG at home with a range of 90-100, rarely 108-120 with more travel/disruption of diet/exercise routine - Current diabetic medications include: none - diet and exercise controlled - He is not currently symptomatic.  - He denies polydipsia, polyphagia, polyuria, headaches, diaphoresis, shakiness, chills, pain, numbness or tingling in extremities and changes in vision.   - Clinical course has been improving. - He  reports an exercise routine that includes regular cardio, 5-6 days per week for approx 45 minutes. - His diet is low in salt, low in fat, and low in carbohydrates. - Weight trend: stable  PREVENTION: Eye exam current (within one year): yes - due 06/2018 Foot exam current (within one year): yes - due today Lipid/ASCVD risk reduction - on statin: yes Kidney protection - on ace or arb: yes lisinopril 2.5 mg daily Recent Labs    07/17/17 0803 10/20/17 0805 02/09/18 1545 05/15/18 0802  HGBA1C 5.9* 5.8* 5.7* 5.9*    Hyperlipidemia Patient continues to take atorvastatin 40 mg once daily without any side effects. LDL < 70 on lab check 05/15/2018. - Pt denies changes in vision, chest tightness/pressure, palpitations, shortness of breath, leg pain while walking, leg or arm weakness, and sudden loss of speech or loss of consciousness.   Social History   Tobacco Use  . Smoking status: Former Smoker    Packs/day: 0.10    Years: 3.00    Pack years: 0.30    Types: Cigarettes    Last attempt to quit: 08/15/1994    Years since quitting: 23.7  . Smokeless tobacco: Never Used  . Tobacco comment: weekend smoker, pt would only smoke bout 1 -2 cig on the weekend.   Substance Use Topics  . Alcohol use: No    Comment: on  week-ends  . Drug use: No    Review of Systems Per HPI unless specifically indicated above     Objective:    BP 96/60 (BP Location: Right Arm, Patient Position: Sitting, Cuff Size: Normal)   Pulse 67   Temp 98 F (36.7 C) (Oral)   Ht 5\' 10"  (1.778 m)   Wt 177 lb 12.8 oz (80.6 kg)   BMI 25.51 kg/m   Wt Readings from Last 3 Encounters:  05/18/18 177 lb 12.8 oz (80.6 kg)  02/09/18 174 lb 9.6 oz (79.2 kg)  12/01/17 176 lb 8 oz (80.1 kg)    Physical Exam  Constitutional: He is oriented to person, place, and time. He appears well-developed and well-nourished. No distress.  HENT:  Head: Normocephalic and atraumatic.  Eyes: Pupils are equal, round, and reactive to light.  Neck: Normal range of motion. Neck supple. Carotid bruit is not present.  Cardiovascular: Normal rate, regular rhythm, S1 normal, S2 normal, normal heart sounds and intact distal pulses.  Pulmonary/Chest: Effort normal and breath sounds normal. No respiratory distress.  Abdominal: Soft. Bowel sounds are normal. He exhibits no distension. There is no hepatosplenomegaly. There is no tenderness. No hernia.  Musculoskeletal: He exhibits no edema (pedal).  Neurological: He is alert and oriented to person, place, and time.  Skin: Skin is warm and dry.  Psychiatric: He has a normal mood and affect. His behavior is normal. Judgment and  thought content normal.  Vitals reviewed.   Results for orders placed or performed in visit on 05/15/18  Hemoglobin A1c  Result Value Ref Range   Hgb A1c MFr Bld 5.9 (H) <5.7 % of total Hgb   Mean Plasma Glucose 123 (calc)   eAG (mmol/L) 6.8 (calc)  Lipid panel  Result Value Ref Range   Cholesterol 119 <200 mg/dL   HDL 52 >40 mg/dL   Triglycerides 62 <150 mg/dL   LDL Cholesterol (Calc) 53 mg/dL (calc)   Total CHOL/HDL Ratio 2.3 <5.0 (calc)   Non-HDL Cholesterol (Calc) 67 <130 mg/dL (calc)  COMPLETE METABOLIC PANEL WITH GFR  Result Value Ref Range   Glucose, Bld 114 (H) 65 - 99  mg/dL   BUN 19 7 - 25 mg/dL   Creat 0.79 0.60 - 1.35 mg/dL   GFR, Est Non African American 110 > OR = 60 mL/min/1.33m2   GFR, Est African American 127 > OR = 60 mL/min/1.49m2   BUN/Creatinine Ratio NOT APPLICABLE 6 - 22 (calc)   Sodium 139 135 - 146 mmol/L   Potassium 4.8 3.5 - 5.3 mmol/L   Chloride 103 98 - 110 mmol/L   CO2 28 20 - 32 mmol/L   Calcium 9.5 8.6 - 10.3 mg/dL   Total Protein 6.7 6.1 - 8.1 g/dL   Albumin 4.4 3.6 - 5.1 g/dL   Globulin 2.3 1.9 - 3.7 g/dL (calc)   AG Ratio 1.9 1.0 - 2.5 (calc)   Total Bilirubin 0.8 0.2 - 1.2 mg/dL   Alkaline phosphatase (APISO) 76 40 - 115 U/L   AST 20 10 - 40 U/L   ALT 26 9 - 46 U/L      Assessment & Plan:   Problem List Items Addressed This Visit      Endocrine   Type 2 diabetes mellitus with microalbuminuria, without long-term current use of insulin (HCC) - Primary    Improving.  Patient has stable A1c with cessation of metformin after last visit.  He continues adhering to lifestyle modification with maintained weight loss and continues to be very engaged in self care adherence. - Known CAD w/ Hx MI - Initial A1c 04/14/17: 10.2%  Today off metformin is 5.9% up from 5.7% 3 months ago.  Plan: 1. Continue home CBG check for fasting am blood sugar. Goal less than 120, higher than 70.  OK to have a few > 120 as long as is not sustained. 2. Reinforced low glycemic diet and exercise. 3. Ophthalmology and foot exams up to date.  Repeated foot exam today, advised patient to schedule next ophthalmology exam for November - Continue ACE-I, statin. 4. Follow up 6 months       Relevant Medications   lisinopril (PRINIVIL,ZESTRIL) 2.5 MG tablet   MICROLET LANCETS MISC   atorvastatin (LIPITOR) 40 MG tablet   Other Relevant Orders   COMPLETE METABOLIC PANEL WITH GFR   Lipid panel   Hemoglobin A1c     Other   Mixed hyperlipidemia    Stable.  Labs checked prior to visit today.  LDL < 70 (actually <55 and meeting new EAC guideline  recommendation for LDL) Lifestyle modifications sustained.  Taking atorvastatin and having no side effects.  No new ASCVD events.  Continue atorvastatin.  Refill meds.  Followup with labs again and office visit 6 months.      Relevant Medications   Icosapent Ethyl (VASCEPA) 1 g CAPS   lisinopril (PRINIVIL,ZESTRIL) 2.5 MG tablet   atorvastatin (LIPITOR) 40 MG tablet  Other Relevant Orders   COMPLETE METABOLIC PANEL WITH GFR   Lipid panel    Other Visit Diagnoses    Need for Tdap vaccination       Pt < age 55.  Administer quadrivalent flu vaccine.   Relevant Orders   Tdap vaccine greater than or equal to 7yo IM (Completed)   Encounter for screening for human immunodeficiency virus (HIV)       Low risk HIV.  No recent screening.  No new exposures that may increase risk.  HIV screen w next labs.   Relevant Orders   HIV Antibody (routine testing w rflx)      Meds ordered this encounter  Medications  . Icosapent Ethyl (VASCEPA) 1 g CAPS    Sig: Take 2 capsules (2 g total) by mouth 2 (two) times daily.    Dispense:  360 capsule    Refill:  3    Order Specific Question:   Supervising Provider    Answer:   Olin Hauser [[id number]]  . lisinopril (PRINIVIL,ZESTRIL) 2.5 MG tablet    Sig: Take 1 tablet (2.5 mg total) by mouth daily.    Dispense:  90 tablet    Refill:  3    Order Specific Question:   Supervising Provider    Answer:   Olin Hauser [[id number]]  . MICROLET LANCETS MISC    Sig: USE TO CHECK GLUCOSE UP TO 2 TIMES DAILY AS DIRECTED    Dispense:  180 each    Refill:  3    Order Specific Question:   Supervising Provider    Answer:   Olin Hauser [[id number]]  . atorvastatin (LIPITOR) 40 MG tablet    Sig: Take 1 tablet (40 mg total) by mouth daily.    Dispense:  90 tablet    Refill:  3    Order Specific Question:   Supervising Provider    Answer:   Olin Hauser [[id number]]    Follow up plan: Return in about 6 months (around 11/17/2018) for  diabetes with labs 2-3 days prior.  Cassell Smiles, DNP, AGPCNP-BC Adult Gerontology Primary Care Nurse Practitioner [facility] 05/18/2018, 8:04 AM

## 2018-05-18 NOTE — Assessment & Plan Note (Signed)
Improving.  Patient has stable A1c with cessation of metformin after last visit.  He continues adhering to lifestyle modification with maintained weight loss and continues to be very engaged in self care adherence. - Known CAD w/ Hx MI - Initial A1c 04/14/17: 10.2%  Today off metformin is 5.9% up from 5.7% 3 months ago.  Plan: 1. Continue home CBG check for fasting am blood sugar. Goal less than 120, higher than 70.  OK to have a few > 120 as long as is not sustained. 2. Reinforced low glycemic diet and exercise. 3. Ophthalmology and foot exams up to date.  Repeated foot exam today, advised patient to schedule next ophthalmology exam for November - Continue ACE-I, statin. 4. Follow up 6 months

## 2018-05-18 NOTE — Patient Instructions (Addendum)
Stephen [name],   Thank you for coming in to clinic today.  1. Continue medications today without changes. - Revisit need to continue Plavix with Dr. Fletcher Anon.  This is something that should definitely be continued for 1 year after stent.  You are at that point now, but you may decide to stay on this to further reduce your risk of future heart attack.  2. Continue managing diet and exercise for Diabetes control.    3. It is ok to eat 2,000-2,300 mg sodium per day.  4. Normal for you: morning CBG < 120.  OK to occasionally to be around 130.  Please schedule a follow-up appointment with Stephen [name], AGNP. Return in about 6 months (around 11/17/2018) for diabetes.  If you have any other questions or concerns, please feel free to call the clinic or send a message through [place]. You may also schedule an earlier appointment if necessary.  You will receive a survey after today's visit either digitally by e-mail or paper by [employer]. Your experiences and feedback matter to [place].  Please respond so we know how we are doing as we provide care for you.   Stephen Smiles, DNP, AGNP-BC Adult Gerontology Nurse Practitioner [place]

## 2018-05-20 ENCOUNTER — Encounter: Payer: Self-pay | Admitting: Nurse Practitioner

## 2018-05-20 DIAGNOSIS — E1129 Type 2 diabetes mellitus with other diabetic kidney complication: Secondary | ICD-10-CM

## 2018-05-20 DIAGNOSIS — R809 Proteinuria, unspecified: Principal | ICD-10-CM

## 2018-05-21 MED ORDER — GLUCOSE BLOOD VI STRP: ORAL_STRIP | 12 refills | Status: DC

## 2018-06-05 ENCOUNTER — Encounter: Payer: Self-pay | Admitting: Cardiovascular Disease

## 2018-06-05 ENCOUNTER — Ambulatory Visit (INDEPENDENT_AMBULATORY_CARE_PROVIDER_SITE_OTHER): Payer: Managed Care, Other (non HMO) | Admitting: Cardiovascular Disease

## 2018-06-05 VITALS — BP 100/60 | HR 66 | Ht 71.0 in | Wt 180.0 lb

## 2018-06-05 DIAGNOSIS — I2510 Atherosclerotic heart disease of native coronary artery without angina pectoris: Secondary | ICD-10-CM | POA: Diagnosis not present

## 2018-06-05 DIAGNOSIS — E782 Mixed hyperlipidemia: Secondary | ICD-10-CM

## 2018-06-05 NOTE — Progress Notes (Signed)
Cardiology Office Note   Date:  06/05/2018   ID:  Stephen [name], DOB 1976-08-02, MRN [id number]  PCP:  Mikey College, NP  Cardiologist:   Kathlyn Sacramento, MD   Chief Complaint  Patient presents with  . other    6 month follow up. Meds reviewed by the pt. verbally. "doing well."       History of Present Illness: Ronav Furney is a 42 y.o. male who is here today for a follow-up visit regarding coronary artery disease.  He has known history of type 2 diabetes and hyperlipidemia.   He had unstable angina in September 2018 with abnormal nuclear stress test.  EF was normal by echo. Cardiac catheterization  showed significant underlying 2 -vessel coronary artery disease with subacute thrombotic occlusion of the mid to distal right coronary artery with left-to-right collaterals, occluded OM 3 with left to left collaterals, significant stenosis in OM 2 and moderate disease in second diagonal. The LAD was mildly and diffusely diseased. Ejection fraction was normal. Left ventricular end-diastolic pressure was high normal. He was treated medically initially but had an episode of chest pain and thus underwent successful PCI and drug-eluting stent placement to OM2.  He improved his lifestyle significantly since last year and managed to lose 50 pounds.  He was able to come off metformin and hemoglobin A1c continues to be below 6.  He is doing well with no recent chest pain, shortness of breath or palpitations.   Past Medical History:  Diagnosis Date  . CAD (coronary artery disease)    a. LHC 04/2017: D2 60%, pLCx 30%, OM2 85%, OM3 100%L-L collats, m-dRCA 100% L-R collats, LVEF 55-65%; b. LHC 05/15/2017: D2 60%, pLCx 30%, OM2-1 85% s/p PCI/DES, OM2-2 30%, OM3 100% L-L collats, m-dRCA 100% L-R collats    . Diabetes mellitus with complication ([place])   . History of echocardiogram    a. 04/2017: EF 55-60%, normal wall motion, normal LV diastolic function, no significant valvular abnormalities  .  Hyperlipidemia   . Kidney stones 2002, 2008   no stone eval  . Myocardial infarction [facility])     Past Surgical History:  Procedure Laterality Date  . APPENDECTOMY    . CORONARY STENT INTERVENTION N/A 05/15/2017   Procedure: CORONARY STENT INTERVENTION;  Surgeon: Wellington Hampshire, MD;  Location: [place] [facility];  Service: Cardiovascular;  Laterality: N/A;  . LEFT HEART CATH AND CORONARY ANGIOGRAPHY Left 04/24/2017   Procedure: LEFT HEART CATH AND CORONARY ANGIOGRAPHY;  Surgeon: Wellington Hampshire, MD;  Location: [place] [facility] [facility];  Service: Cardiovascular;  Laterality: Left;     Current Outpatient Medications  Medication Sig Dispense Refill  . acetaminophen (TYLENOL) 500 MG tablet Take 500-1,000 mg by mouth every 6 (six) hours as needed (for pain/headaches.).    Marland Kitchen aspirin EC 81 MG tablet Take 1 tablet (81 mg total) by mouth daily. (Patient taking differently: Take 81 mg by mouth at bedtime. ) 90 tablet 3  . atorvastatin (LIPITOR) 40 MG tablet Take 1 tablet (40 mg total) by mouth daily. 90 tablet 3  . blood glucose meter kit and supplies KIT Dispense based on patient and insurance preference. Use up to four times daily as directed. (FOR ICD-9 250.00, 250.01). 1 each 0  . clopidogrel (PLAVIX) 75 MG tablet Take 1 tablet (75 mg total) by mouth daily. 90 tablet 1  . glucose blood (CONTOUR NEXT TEST) test strip Use as instructed to check blood sugar up to twice daily. [place]  each 12  . Icosapent Ethyl (VASCEPA) 1 g CAPS Take 2 capsules (2 g total) by mouth 2 (two) times daily. 360 capsule 3  . lisinopril (PRINIVIL,ZESTRIL) 2.5 MG tablet Take 1 tablet (2.5 mg total) by mouth daily. 90 tablet 3  . MICROLET LANCETS MISC USE TO CHECK GLUCOSE UP TO 2 TIMES DAILY AS DIRECTED 180 each 3  . nitroGLYCERIN (NITROSTAT) 0.4 MG SL tablet Place 1 tablet (0.4 mg total) under the tongue every 5 (five) minutes as needed for chest pain. 25 tablet 3   No current facility-administered medications for this visit.       Allergies:   Patient has no known allergies.    Social History:  The patient  reports that he quit smoking about 23 years ago. His smoking use included cigarettes. He has a 0.30 pack-year smoking history. He has never used smokeless tobacco. He reports that he does not drink alcohol or use drugs.   Family History:  [name] family history includes CAD (age of onset: 48) in his mother; Cancer in his father; Cholecystitis in his father; Chronic Renal Failure in his mother; Diabetes in his father, paternal uncle, and sister; Healthy in his sister, son, and son; Heart disease in his mother; Hyperlipidemia in his mother; Hypertension in his mother; Hyperthyroidism in his sister, sister, and sister; Lung cancer in his paternal aunt; Multiple sclerosis in his sister; Pancreatic cancer in his father and paternal uncle; Stroke in his paternal uncle; Thyroid disease in his mother.    ROS:  Please see the history of present illness.   Otherwise, review of systems are positive for none.   All other systems are reviewed and negative.    PHYSICAL EXAM: VS:  BP 100/60 (BP Location: Left Arm, Patient Position: Sitting, Cuff Size: Normal)   Pulse 66   Ht '5\' 11"'  (1.803 m)   Wt 180 lb (81.6 kg)   BMI 25.10 kg/m  , BMI Body mass index is 25.1 kg/m. GEN: Well nourished, well developed, in no acute distress  HEENT: normal  Neck: no JVD, carotid bruits, or masses Cardiac: RRR; no murmurs, rubs, or gallops,no edema  Respiratory:  clear to auscultation bilaterally, normal work of breathing GI: soft, nontender, nondistended, + BS MS: no deformity or atrophy  Skin: warm and dry, no rash Neuro:  Strength and sensation are intact Psych: euthymic mood, full affect  EKG:  EKG is  ordered today. EKG showed normal sinus rhythm with nonspecific T wave changes.  Recent Labs: 10/20/2017: Hemoglobin 14.6; Platelets 214 05/15/2018: ALT 26; BUN 19; Creat 0.79; Potassium 4.8; Sodium 139    Lipid Panel     Component Value Date/Time   CHOL 119 05/15/2018 0802   TRIG 62 05/15/2018 0802   HDL 52 05/15/2018 0802   CHOLHDL 2.3 05/15/2018 0802   VLDL 56 (H) 04/14/2017 1137   LDLCALC 53 05/15/2018 0802      Wt Readings from Last 3 Encounters:  06/05/18 180 lb (81.6 kg)  05/18/18 177 lb 12.8 oz (80.6 kg)  02/09/18 174 lb 9.6 oz (79.2 kg)        PAD Screen 04/18/2017  Previous PAD dx? No  Previous surgical procedure? No  Pain with walking? No  Feet/toe relief with dangling? No  Painful, non-healing ulcers? No  Extremities discolored? No      ASSESSMENT AND PLAN:  1.  Coronary artery disease involving native coronary arteries without angina: He is doing very well without angina.  Continue medical therapy.  It has been 1 year since angioplasty and drug-eluting stent placement.  Thus, I discontinued clopidogrel today.  Continue aspirin indefinitely.  2.  Mixed hyperlipidemia: Continue treatment with atorvastatin with a target LDL of less than 70.  He is also on Vascepa for known elevated triglyceride.  Most recent lipid profile showed an LDL of 53 and triglyceride of 62.  3. Type 2 diabetes: He is off medications and most recent hemoglobin A1c was 5.9.  His blood pressure continues to be on the low side with mild dizziness.  Consider stopping lisinopril.  Disposition:   FU with me in 6 months.  Signed,  Kathlyn Sacramento, MD  06/05/2018 4:31 PM    [facility]

## 2018-06-05 NOTE — Patient Instructions (Signed)
Medication Instructions:  STOP the Plavix  If you need a refill on your cardiac medications before your next appointment, please call your pharmacy.   Lab work: None ordered  Testing/Procedures: None ordered  Follow-Up: At [facility], you and your health needs are our priority.  As part of our continuing mission to provide you with exceptional heart care, we have created designated Provider Care Teams.  These Care Teams include your primary Cardiologist (physician) and Advanced Practice Providers (APPs -  Physician Assistants and Nurse Practitioners) who all work together to provide you with the care you need, when you need it. You will need a follow up appointment in 6 months with Dr. Fletcher Anon. Please call our office 2 months in advance to schedule this appointment.

## 2018-11-08 ENCOUNTER — Encounter: Payer: Self-pay | Admitting: Nurse Practitioner

## 2018-11-12 ENCOUNTER — Other Ambulatory Visit: Payer: Self-pay

## 2018-11-12 ENCOUNTER — Other Ambulatory Visit: Payer: Managed Care, Other (non HMO)

## 2018-11-12 DIAGNOSIS — E1129 Type 2 diabetes mellitus with other diabetic kidney complication: Secondary | ICD-10-CM

## 2018-11-12 DIAGNOSIS — E782 Mixed hyperlipidemia: Secondary | ICD-10-CM

## 2018-11-12 DIAGNOSIS — Z114 Encounter for screening for human immunodeficiency virus [HIV]: Secondary | ICD-10-CM

## 2018-11-12 DIAGNOSIS — R809 Proteinuria, unspecified: Principal | ICD-10-CM

## 2018-11-13 LAB — LIPID PANEL
Cholesterol: 127 mg/dL (ref <200)
HDL: 41 mg/dL (ref >=40)
LDL Cholesterol (Calc): 70 mg/dL (calc)
Non-HDL Cholesterol (Calc): 86 mg/dL (calc) (ref <130)
Total CHOL/HDL Ratio: 3.1 (calc) (ref <5.0)
Triglycerides: 78 mg/dL (ref <150)

## 2018-11-13 LAB — COMPLETE METABOLIC PANEL WITH GFR
AG Ratio: 2 (calc) (ref 1.0–2.5)
ALT: 34 U/L (ref 9–46)
AST: 41 U/L — ABNORMAL HIGH (ref 10–40)
Albumin: 4.5 g/dL (ref 3.6–5.1)
Alkaline phosphatase (APISO): 65 U/L (ref 36–130)
BUN: 20 mg/dL (ref 7–25)
CO2: 28 mmol/L (ref 20–32)
Calcium: 9.4 mg/dL (ref 8.6–10.3)
Chloride: 103 mmol/L (ref 98–110)
Creat: 0.79 mg/dL (ref 0.60–1.35)
GFR, Est African American: 127 mL/min/{1.73_m2} (ref >=60)
GFR, Est Non African American: 110 mL/min/{1.73_m2} (ref >=60)
Globulin: 2.3 g/dL (calc) (ref 1.9–3.7)
Glucose, Bld: 109 mg/dL — ABNORMAL HIGH (ref 65–99)
Potassium: 4.7 mmol/L (ref 3.5–5.3)
Sodium: 138 mmol/L (ref 135–146)
Total Bilirubin: 0.8 mg/dL (ref 0.2–1.2)
Total Protein: 6.8 g/dL (ref 6.1–8.1)

## 2018-11-13 LAB — HEMOGLOBIN A1C
Hgb A1c MFr Bld: 6 % of total Hgb — ABNORMAL HIGH (ref <5.7)
Mean Plasma Glucose: 126 (calc)
eAG (mmol/L): 7 (calc)

## 2018-11-13 LAB — HIV ANTIBODY (ROUTINE TESTING W REFLEX): HIV 1&2 Ab, 4th Generation: NONREACTIVE

## 2018-11-16 ENCOUNTER — Ambulatory Visit (INDEPENDENT_AMBULATORY_CARE_PROVIDER_SITE_OTHER): Payer: Managed Care, Other (non HMO) | Admitting: Nurse Practitioner

## 2018-11-16 ENCOUNTER — Other Ambulatory Visit: Payer: Self-pay

## 2018-11-16 ENCOUNTER — Encounter: Payer: Self-pay | Admitting: Nurse Practitioner

## 2018-11-16 ENCOUNTER — Other Ambulatory Visit: Payer: Self-pay | Admitting: Nurse Practitioner

## 2018-11-16 DIAGNOSIS — E782 Mixed hyperlipidemia: Secondary | ICD-10-CM

## 2018-11-16 DIAGNOSIS — E1129 Type 2 diabetes mellitus with other diabetic kidney complication: Secondary | ICD-10-CM

## 2018-11-16 DIAGNOSIS — R809 Proteinuria, unspecified: Principal | ICD-10-CM

## 2018-11-16 MED ORDER — GLUCOSE BLOOD VI STRP: ORAL_STRIP | 4 refills | Status: DC

## 2018-11-16 MED ORDER — LISINOPRIL 2.5 MG PO TABS: 2.5000 mg | ORAL_TABLET | Freq: Every day | ORAL | 4 refills | Status: DC

## 2018-11-16 MED ORDER — ATORVASTATIN CALCIUM 40 MG PO TABS: 40.0000 mg | ORAL_TABLET | Freq: Every day | ORAL | 4 refills | Status: DC

## 2018-11-16 MED ORDER — MICROLET LANCETS MISC: 4 refills | Status: DC

## 2018-11-16 NOTE — Progress Notes (Signed)
Subjective:    Patient ID: Stephen [name], male    DOB: 1975/12/27, 43 y.o.   MRN: [id number]  Stephen [name] is a 43 y.o. male presenting on 11/16/2018 for Diabetes   HPI Diabetes Pt presents today for follow up of Type 2 diabetes mellitus.  He is checking fasting am CBG at home with a range of   93-121; prior to shelter in place orders for Covid-19, these were consistently 90-100s.  Patient cites more lability with less physical activity and less access to his gym. - Current diabetic medications include: none - He is not currently symptomatic.  - He denies polydipsia, polyphagia, polyuria, headaches, diaphoresis, shakiness, chills, pain, numbness or tingling in extremities and changes in vision.   - Clinical course has been stable. - His diet is moderate in salt, moderate in fat, and moderate in carbohydrates. - Weight trend: stable  PREVENTION: Eye exam current (within one year): no Foot exam current (within one year): no  Lipid/ASCVD risk reduction - on statin: yes Kidney protection - on ace or arb: yes   Recent Labs    02/09/18 1545 05/15/18 0802 11/12/18 0757  HGBA1C 5.7* 5.9* 6.0*    Social History   Tobacco Use  . Smoking status: Former Smoker    Packs/day: 0.10    Years: 3.00    Pack years: 0.30    Types: Cigarettes    Last attempt to quit: 08/15/1994    Years since quitting: 24.2  . Smokeless tobacco: Never Used  . Tobacco comment: weekend smoker, pt would only smoke bout 1 -2 cig on the weekend.   Substance Use Topics  . Alcohol use: No    Comment: on week-ends  . Drug use: No    Review of Systems Per HPI unless specifically indicated above     Objective:    There were no vitals taken for this visit.  Wt Readings from Last 3 Encounters:  06/05/18 180 lb (81.6 kg)  05/18/18 177 lb 12.8 oz (80.6 kg)  02/09/18 174 lb 9.6 oz (79.2 kg)    Physical Exam Patient remotely monitored.  Verbal communication appropriate.  Cognition normal.   Results for orders  placed or performed in visit on 11/12/18  Hemoglobin A1c  Result Value Ref Range   Hgb A1c MFr Bld 6.0 (H) <5.7 % of total Hgb   Mean Plasma Glucose 126 (calc)   eAG (mmol/L) 7.0 (calc)  HIV Antibody (routine testing w rflx)  Result Value Ref Range   HIV 1&2 Ab, 4th Generation NON-REACTIVE NON-REACTI  Lipid panel  Result Value Ref Range   Cholesterol 127 <200 mg/dL   HDL 41 > OR = 40 mg/dL   Triglycerides 78 <150 mg/dL   LDL Cholesterol (Calc) 70 mg/dL (calc)   Total CHOL/HDL Ratio 3.1 <5.0 (calc)   Non-HDL Cholesterol (Calc) 86 <130 mg/dL (calc)  COMPLETE METABOLIC PANEL WITH GFR  Result Value Ref Range   Glucose, Bld 109 (H) 65 - 99 mg/dL   BUN 20 7 - 25 mg/dL   Creat 0.79 0.60 - 1.35 mg/dL   GFR, Est Non African American 110 > OR = 60 mL/min/1.36m2   GFR, Est African American 127 > OR = 60 mL/min/1.91m2   BUN/Creatinine Ratio NOT APPLICABLE 6 - 22 (calc)   Sodium 138 135 - 146 mmol/L   Potassium 4.7 3.5 - 5.3 mmol/L   Chloride 103 98 - 110 mmol/L   CO2 28 20 - 32 mmol/L   Calcium 9.4 8.6 -  10.3 mg/dL   Total Protein 6.8 6.1 - 8.1 g/dL   Albumin 4.5 3.6 - 5.1 g/dL   Globulin 2.3 1.9 - 3.7 g/dL (calc)   AG Ratio 2.0 1.0 - 2.5 (calc)   Total Bilirubin 0.8 0.2 - 1.2 mg/dL   Alkaline phosphatase (APISO) 65 36 - 130 U/L   AST 41 (H) 10 - 40 U/L   ALT 34 9 - 46 U/L      Assessment & Plan:   Problem List Items Addressed This Visit      Endocrine   Type 2 diabetes mellitus with microalbuminuria, without long-term current use of insulin (HCC)   Relevant Medications   atorvastatin (LIPITOR) 40 MG tablet   lisinopril (PRINIVIL,ZESTRIL) 2.5 MG tablet   Microlet Lancets MISC   glucose blood (CONTOUR NEXT TEST) test strip    Well-controlled T2DM with A1c 6.0% stable from 6 months ago and goal A1c < 7.0%. - Complications - hyperlipidemia and microalbuminuria.  Plan:  1. Continue current therapy: lifestyle management 2. Encourage improved lifestyle: - low carb/low glycemic  diet reinforced prior education - Increase physical activity to 30 minutes most days of the week.  Explained that increased physical activity increases body's use of sugar for energy. 3. Check fasting am CBG and bring log to next visit for review 4. Continue ASA, ACEi and Statin 5. Advised to schedule DM ophtho exam, send record. 6. Follow-up 6 months    Meds ordered this encounter  Medications  . atorvastatin (LIPITOR) 40 MG tablet    Sig: Take 1 tablet (40 mg total) by mouth daily.    Dispense:  90 tablet    Refill:  4    Order Specific Question:   Supervising Provider    Answer:   Olin Hauser [[id number]]  . lisinopril (PRINIVIL,ZESTRIL) 2.5 MG tablet    Sig: Take 1 tablet (2.5 mg total) by mouth daily.    Dispense:  90 tablet    Refill:  4    Order Specific Question:   Supervising Provider    Answer:   Olin Hauser [[id number]]  . Microlet Lancets MISC    Sig: USE TO CHECK GLUCOSE UP TO 2 TIMES DAILY AS DIRECTED    Dispense:  180 each    Refill:  4    Order Specific Question:   Supervising Provider    Answer:   Olin Hauser [[id number]]  . glucose blood (CONTOUR NEXT TEST) test strip    Sig: Use as instructed to check blood sugar up to twice daily.    Dispense:  200 each    Refill:  4    Order Specific Question:   Supervising Provider    Answer:   Olin Hauser [[id number]]   Disclosed to patient at start of encounter that we will bill telephone services and he will receive bill of services provided.  Patient consents to be treated via phone prior to discussion. - Patient is at his home and is accessed via telephone. - Services are provided from [facility]. - Time spent in direct consultation with patient on phone: 8 minutes  Follow up plan: Return in about 6 months (around 05/18/2019) for diabetes, cholesterol.  Cassell Smiles, DNP, AGPCNP-BC Adult Gerontology Primary Care Nurse Practitioner [facility] 11/16/2018, 8:02 AM

## 2018-11-16 NOTE — Patient Instructions (Addendum)
[place],   Thank you for coming in to clinic today.  1. Continue your great lifestyle!  You are doing very well to keep diabetes controlled without medications. - Exercise, even if only 10 minute walk after meals will help keep your blood sugars lower when you cannot get to the gym.  2. Great work with cholesterol management.  To prevent worsening, continue medications and remember that exercise also helps lower your LDL and keep it at goal of < 70.  Please schedule a follow-up appointment with Cassell Smiles, AGNP. Return in about 6 months (around 05/18/2019) for diabetes, cholesterol.  If you have any other questions or concerns, please feel free to call the clinic or send a message through [place]. You may also schedule an earlier appointment if necessary.  You will receive a survey after today's visit either digitally by e-mail or paper by [employer]. Your experiences and feedback matter to [place].  Please respond so we know how we are doing as we provide care for you.   Cassell Smiles, DNP, AGNP-BC Adult Gerontology Nurse Practitioner [place]

## 2019-01-14 ENCOUNTER — Telehealth: Payer: Self-pay

## 2019-01-14 NOTE — Telephone Encounter (Signed)
Pt called to notify us that his Stephen [name] is requiring a prior auth.

## 2019-01-21 ENCOUNTER — Telehealth: Payer: Self-pay

## 2019-01-21 NOTE — Telephone Encounter (Signed)
Virtual Visit Pre-Appointment Phone Call  "[place], I am calling you today to discuss your upcoming appointment. We are currently trying to limit exposure to the virus that causes COVID-19 by seeing patients at home rather than in the office."  1. "What is the BEST phone number to call the day of the visit?" - include this in appointment notes  2. Do you have or have access to (through a family member/friend) a smartphone with video capability that we can use for your visit?" a. If yes - list this number in appt notes as cell (if different from BEST phone #) and list the appointment type as a VIDEO visit in appointment notes b. If no - list the appointment type as a PHONE visit in appointment notes  3. Confirm consent - "In the setting of the current Covid19 crisis, you are scheduled for a phone visit with your provider on 03/12/2019 at 3:00PM.  Just as we do with many in-office visits, in order for you to participate in this visit, we must obtain consent.  If you'd like, I can send this to your mychart (if signed up) or email for you to review.  Otherwise, I can obtain your verbal consent now.  All virtual visits are billed to your insurance company just like a normal visit would be.  By agreeing to a virtual visit, we'd like you to understand that the technology does not allow for your provider to perform an examination, and thus may limit your provider's ability to fully assess your condition. If your provider identifies any concerns that need to be evaluated in person, we will make arrangements to do so.  Finally, though the technology is pretty good, we cannot assure that it will always work on either your or our end, and in the setting of a video visit, we may have to convert it to a phone-only visit.  In either situation, we cannot ensure that we have a secure connection.  Are you willing to proceed?" STAFF: Did the patient verbally acknowledge consent to telehealth visit? Document YES/NO here:  YES  4. Advise patient to be prepared - "Two hours prior to your appointment, go ahead and check your blood pressure, pulse, oxygen saturation, and your weight (if you have the equipment to check those) and write them all down. When your visit starts, your provider will ask you for this information. If you have an Apple Watch or Kardia device, please plan to have heart rate information ready on the day of your appointment. Please have a pen and paper handy nearby the day of the visit as well."  5. Give patient instructions for MyChart download to smartphone OR [email or web address] as below if video visit (depending on what platform provider is using)  6. Inform patient they will receive a phone call 15 minutes prior to their appointment time (may be from unknown caller ID) so they should be prepared to answer    [place] has been deemed a candidate for a follow-up tele-health visit to limit community exposure during the Covid-19 pandemic. I spoke with the patient via phone to ensure availability of phone/video source, confirm preferred email & phone number, and discuss instructions and expectations.  I reminded Stephen [name] to be prepared with any vital sign and/or heart rhythm information that could potentially be obtained via home monitoring, at the time of his visit. I reminded Stephen [name] to expect a phone call prior to his visit.  Kayleann Mccaffery L  Raelene Bott 01/21/2019 3:42 PM    FULL LENGTH CONSENT FOR TELE-HEALTH VISIT   I hereby voluntarily request, consent and authorize [facility] and its employed or contracted physicians, physician assistants, nurse practitioners or other licensed health care professionals (the Practitioner), to provide me with telemedicine health care services (the Services") as deemed necessary by the treating Practitioner. I acknowledge and consent to receive the Services by the Practitioner via telemedicine. I understand that the telemedicine  visit will involve communicating with the Practitioner through live audiovisual communication technology and the disclosure of certain medical information by electronic transmission. I acknowledge that I have been given the opportunity to request an in-person assessment or other available alternative prior to the telemedicine visit and am voluntarily participating in the telemedicine visit.  I understand that I have the right to withhold or withdraw my consent to the use of telemedicine in the course of my care at any time, without affecting my right to future care or treatment, and that the Practitioner or I may terminate the telemedicine visit at any time. I understand that I have the right to inspect all information obtained and/or recorded in the course of the telemedicine visit and may receive copies of available information for a reasonable fee.  I understand that some of the potential risks of receiving the Services via telemedicine include:   Delay or interruption in medical evaluation due to technological equipment failure or disruption;  Information transmitted may not be sufficient (e.g. poor resolution of images) to allow for appropriate medical decision making by the Practitioner; and/or   In rare instances, security protocols could fail, causing a breach of personal health information.  Furthermore, I acknowledge that it is my responsibility to provide information about my medical history, conditions and care that is complete and accurate to the best of my ability. I acknowledge that Practitioner's advice, recommendations, and/or decision may be based on factors not within their control, such as incomplete or inaccurate data provided by me or distortions of diagnostic images or specimens that may result from electronic transmissions. I understand that the practice of medicine is not an exact science and that Practitioner makes no warranties or guarantees regarding treatment outcomes. I  acknowledge that I will receive a copy of this consent concurrently upon execution via email to the email address I last provided but may also request a printed copy by calling the office of [facility].    I understand that my insurance will be billed for this visit.   I have read or had this consent read to me.  I understand the contents of this consent, which adequately explains the benefits and risks of the Services being provided via telemedicine.   I have been provided ample opportunity to ask questions regarding this consent and the Services and have had my questions answered to my satisfaction.  I give my informed consent for the services to be provided through the use of telemedicine in my medical care  By participating in this telemedicine visit I agree to the above.

## 2019-02-19 ENCOUNTER — Other Ambulatory Visit: Payer: Self-pay

## 2019-02-19 DIAGNOSIS — E782 Mixed hyperlipidemia: Secondary | ICD-10-CM

## 2019-02-19 MED ORDER — VASCEPA 1 G PO CAPS: 2.0000 g | ORAL_CAPSULE | Freq: Two times a day (BID) | ORAL | 3 refills | Status: DC

## 2019-03-12 ENCOUNTER — Encounter: Payer: Self-pay | Admitting: Cardiovascular Disease

## 2019-03-12 ENCOUNTER — Telehealth (INDEPENDENT_AMBULATORY_CARE_PROVIDER_SITE_OTHER): Payer: Managed Care, Other (non HMO) | Admitting: Cardiovascular Disease

## 2019-03-12 ENCOUNTER — Other Ambulatory Visit: Payer: Self-pay

## 2019-03-12 VITALS — Ht 70.0 in | Wt 175.0 lb

## 2019-03-12 DIAGNOSIS — I2510 Atherosclerotic heart disease of native coronary artery without angina pectoris: Secondary | ICD-10-CM | POA: Diagnosis not present

## 2019-03-12 DIAGNOSIS — E782 Mixed hyperlipidemia: Secondary | ICD-10-CM

## 2019-03-12 NOTE — Patient Instructions (Signed)
Medication Instructions:  Continue same medications If you need a refill on your cardiac medications before your next appointment, please call your pharmacy.   Lab work: None If you have labs (blood work) drawn today and your tests are completely normal, you will receive your results only by: . MyChart Message (if you have MyChart) OR . A paper copy in the mail If you have any lab test that is abnormal or we need to change your treatment, we will call you to review the results.  Testing/Procedures: None  Follow-Up: At [facility], you and your health needs are our priority.  As part of our continuing mission to provide you with exceptional heart care, we have created designated Provider Care Teams.  These Care Teams include your primary Cardiologist (physician) and Advanced Practice Providers (APPs -  Physician Assistants and Nurse Practitioners) who all work together to provide you with the care you need, when you need it. You will need a follow up appointment in 6 months.  Please call our office 2 months in advance to schedule this appointment.  You may see Randal Goens, MD or one of the following Advanced Practice Providers on your designated Care Team:   Christopher Berge, NP Ryan Dunn, PA-C . Jacquelyn Visser, PA-C   

## 2019-03-12 NOTE — Progress Notes (Signed)
Virtual Visit via Video Note   This visit type was conducted due to national recommendations for restrictions regarding the COVID-19 Pandemic (e.g. social distancing) in an effort to limit this patient's exposure and mitigate transmission in our community.  Due to his co-morbid illnesses, this patient is at least at moderate risk for complications without adequate follow up.  This format is felt to be most appropriate for this patient at this time.  All issues noted in this document were discussed and addressed.  A limited physical exam was performed with this format.  Please refer to the patient's chart for his consent to telehealth for [facility].   Date:  03/12/2019   ID:  [name], DOB [birth year], MRN [id number]  Patient Location: Home Provider Location: Office  PCP:  Mikey College, NP  Cardiologist:  Kathlyn Sacramento, MD  Electrophysiologist:  None   Evaluation Performed:  Follow-Up Visit  Chief Complaint: Doing well with no complaints  History of Present Illness:    Stephen [name] is a 43 y.o. male was reached via video visit for follow-up regarding coronary artery disease.  He has known history of type 2 diabetes and hyperlipidemia.   He had unstable angina in September 2018 with abnormal nuclear stress test.  EF was normal by echo. Cardiac catheterization  showed significant underlying 2 -vessel coronary artery disease with subacute thrombotic occlusion of the mid to distal right coronary artery with left-to-right collaterals, occluded OM 3 with left to left collaterals, significant stenosis in a large OM 2 and moderate disease in second diagonal. The LAD was mildly and diffusely diseased. Ejection fraction was normal. Left ventricular end-diastolic pressure was high normal. He was treated medically initially but had worsening angina and thus  underwent successful PCI and drug-eluting stent placement to OM2.  The RCA was left to be treated medically given the  well-developed collaterals. The patient improved his lifestyle significantly after back and was able to lose 50 pounds.  He has been doing extremely well with no chest pain, shortness of breath or palpitations.  He takes his medications regularly.  The patient does not have symptoms concerning for COVID-19 infection (fever, chills, cough, or new shortness of breath).    Past Medical History:  Diagnosis Date  . CAD (coronary artery disease)    a. LHC 04/2017: D2 60%, pLCx 30%, OM2 85%, OM3 100%L-L collats, m-dRCA 100% L-R collats, LVEF 55-65%; b. LHC 05/15/2017: D2 60%, pLCx 30%, OM2-1 85% s/p PCI/DES, OM2-2 30%, OM3 100% L-L collats, m-dRCA 100% L-R collats    . Diabetes mellitus with complication ([place])   . History of echocardiogram    a. 04/2017: EF 55-60%, normal wall motion, normal LV diastolic function, no significant valvular abnormalities  . Hyperlipidemia   . Kidney stones 2002, 2008   no stone eval  . Myocardial infarction [facility])    Past Surgical History:  Procedure Laterality Date  . APPENDECTOMY    . CORONARY STENT INTERVENTION N/A 05/15/2017   Procedure: CORONARY STENT INTERVENTION;  Surgeon: Wellington Hampshire, MD;  Location: [place] CV [facility];  Service: Cardiovascular;  Laterality: N/A;  . LEFT HEART CATH AND CORONARY ANGIOGRAPHY Left 04/24/2017   Procedure: LEFT HEART CATH AND CORONARY ANGIOGRAPHY;  Surgeon: Wellington Hampshire, MD;  Location: [place] CV [facility];  Service: Cardiovascular;  Laterality: Left;     Current Meds  Medication Sig  . acetaminophen (TYLENOL) 500 MG tablet Take 500-1,000 mg by mouth every 6 (six) hours as needed (for pain/headaches.).  Marland Kitchen  aspirin EC 81 MG tablet Take 1 tablet (81 mg total) by mouth daily. (Patient taking differently: Take 81 mg by mouth at bedtime. )  . atorvastatin (LIPITOR) 40 MG tablet Take 1 tablet (40 mg total) by mouth daily.  Marland Kitchen glucose blood (CONTOUR NEXT TEST) test strip Use as instructed to check blood sugar up to twice daily.   Vanessa Kick Ethyl (VASCEPA) 1 g CAPS Take 2 capsules (2 g total) by mouth 2 (two) times daily.  Marland Kitchen lisinopril (PRINIVIL,ZESTRIL) 2.5 MG tablet Take 1 tablet (2.5 mg total) by mouth daily.  . Microlet Lancets MISC USE TO CHECK GLUCOSE UP TO 2 TIMES DAILY AS DIRECTED  . nitroGLYCERIN (NITROSTAT) 0.4 MG SL tablet Place 1 tablet (0.4 mg total) under the tongue every 5 (five) minutes as needed for chest pain.     Allergies:   Patient has no known allergies.   Social History   Tobacco Use  . Smoking status: Former Smoker    Packs/day: 0.10    Years: 3.00    Pack years: 0.30    Types: Cigarettes    Quit date: 08/15/1994    Years since quitting: 24.5  . Smokeless tobacco: Never Used  . Tobacco comment: weekend smoker, pt would only smoke bout 1 -2 cig on the weekend.   Substance Use Topics  . Alcohol use: No    Comment: on week-ends  . Drug use: No     Family Hx: [name] family history includes CAD (age of onset: 84) in his mother; Cancer in his father; Cholecystitis in his father; Chronic Renal Failure in his mother; Diabetes in his father, paternal uncle, and sister; Healthy in his sister, son, and son; Heart disease in his mother; Hyperlipidemia in his mother; Hypertension in his mother; Hyperthyroidism in his sister, sister, and sister; Lung cancer in his paternal aunt; Multiple sclerosis in his sister; Pancreatic cancer in his father and paternal uncle; Stroke in his paternal uncle; Thyroid disease in his mother. There is no history of Vision loss, Heart attack, Breast cancer, Colon cancer, or Prostate cancer.  ROS:   Please see the history of present illness.     All other systems reviewed and are negative.   Prior CV studies:   The following studies were reviewed today:    Labs/Other Tests and Data Reviewed:    EKG:  No ECG reviewed.  Recent Labs: 11/12/2018: ALT 34; BUN 20; Creat 0.79; Potassium 4.7; Sodium 138   Recent Lipid Panel Lab Results  Component Value  Date/Time   CHOL 127 11/12/2018 07:57 AM   TRIG 78 11/12/2018 07:57 AM   HDL 41 11/12/2018 07:57 AM   CHOLHDL 3.1 11/12/2018 07:57 AM   LDLCALC 70 11/12/2018 07:57 AM    Wt Readings from Last 3 Encounters:  03/12/19 175 lb (79.4 kg)  06/05/18 180 lb (81.6 kg)  05/18/18 177 lb 12.8 oz (80.6 kg)     Objective:    Vital Signs:  Ht 5\' 10"  (1.778 m)   Wt 175 lb (79.4 kg)   BMI 25.11 kg/m    VITAL SIGNS:  reviewed GEN:  no acute distress EYES:  sclerae anicteric, EOMI - Extraocular Movements Intact RESPIRATORY:  normal respiratory effort, symmetric expansion SKIN:  no rash, lesions or ulcers. MUSCULOSKELETAL:  no obvious deformities. NEURO:  alert and oriented x 3, no obvious focal deficit PSYCH:  normal affect  ASSESSMENT & PLAN:     1.  Coronary artery disease involving native coronary arteries without angina:  He is doing very well without angina.  Continue medical therapy.  Continue aspirin indefinitely.  2.  Mixed hyperlipidemia: Continue treatment with atorvastatin and Vascepa.I reviewed most recent lipid profile in March of this year which showed a triglyceride of 78 and an LDL of 70.    3. Type 2 diabetes: He is off medications and most recent hemoglobin A1c was 6.   COVID-19 Education: The signs and symptoms of COVID-19 were discussed with the patient and how to seek care for testing (follow up with PCP or arrange E-visit).  The importance of social distancing was discussed today.  Time:   Today, I have spent 8 minutes with the patient with telehealth technology discussing the above problems.     Medication Adjustments/Labs and Tests Ordered: Current medicines are reviewed at length with the patient today.  Concerns regarding medicines are outlined above.   Tests Ordered: No orders of the defined types were placed in this encounter.   Medication Changes: No orders of the defined types were placed in this encounter.   Follow Up:  In Person in 6 month(s)   Signed, Kathlyn Sacramento, MD  03/12/2019 3:17 PM    [place]

## 2019-05-13 ENCOUNTER — Other Ambulatory Visit: Payer: Self-pay

## 2019-05-13 DIAGNOSIS — E1129 Type 2 diabetes mellitus with other diabetic kidney complication: Secondary | ICD-10-CM

## 2019-05-13 DIAGNOSIS — R809 Proteinuria, unspecified: Secondary | ICD-10-CM

## 2019-05-13 DIAGNOSIS — E782 Mixed hyperlipidemia: Secondary | ICD-10-CM

## 2019-05-14 ENCOUNTER — Other Ambulatory Visit: Payer: Managed Care, Other (non HMO)

## 2019-05-14 ENCOUNTER — Other Ambulatory Visit: Payer: Self-pay

## 2019-05-15 LAB — COMPLETE METABOLIC PANEL WITH GFR
AG Ratio: 1.9 (calc) (ref 1.0–2.5)
ALT: 78 U/L — ABNORMAL HIGH (ref 9–46)
AST: 81 U/L — ABNORMAL HIGH (ref 10–40)
Albumin: 4.4 g/dL (ref 3.6–5.1)
Alkaline phosphatase (APISO): 71 U/L (ref 36–130)
BUN: 12 mg/dL (ref 7–25)
CO2: 26 mmol/L (ref 20–32)
Calcium: 9.4 mg/dL (ref 8.6–10.3)
Chloride: 103 mmol/L (ref 98–110)
Creat: 0.83 mg/dL (ref 0.60–1.35)
GFR, Est African American: 124 mL/min/{1.73_m2} (ref >=60)
GFR, Est Non African American: 107 mL/min/{1.73_m2} (ref >=60)
Globulin: 2.3 g/dL (calc) (ref 1.9–3.7)
Glucose, Bld: 117 mg/dL — ABNORMAL HIGH (ref 65–99)
Potassium: 4.3 mmol/L (ref 3.5–5.3)
Sodium: 139 mmol/L (ref 135–146)
Total Bilirubin: 1 mg/dL (ref 0.2–1.2)
Total Protein: 6.7 g/dL (ref 6.1–8.1)

## 2019-05-15 LAB — LIPID PANEL
Cholesterol: 133 mg/dL (ref <200)
HDL: 45 mg/dL (ref >=40)
LDL Cholesterol (Calc): 73 mg/dL (calc)
Non-HDL Cholesterol (Calc): 88 mg/dL (calc) (ref <130)
Total CHOL/HDL Ratio: 3 (calc) (ref <5.0)
Triglycerides: 73 mg/dL (ref <150)

## 2019-05-15 LAB — HEMOGLOBIN A1C
Hgb A1c MFr Bld: 6 % of total Hgb — ABNORMAL HIGH (ref <5.7)
Mean Plasma Glucose: 126 (calc)
eAG (mmol/L): 7 (calc)

## 2019-05-17 ENCOUNTER — Other Ambulatory Visit: Payer: Self-pay

## 2019-05-17 ENCOUNTER — Encounter: Payer: Self-pay | Admitting: Nurse Practitioner

## 2019-05-17 ENCOUNTER — Ambulatory Visit (INDEPENDENT_AMBULATORY_CARE_PROVIDER_SITE_OTHER): Payer: Managed Care, Other (non HMO) | Admitting: Nurse Practitioner

## 2019-05-17 DIAGNOSIS — R809 Proteinuria, unspecified: Secondary | ICD-10-CM | POA: Diagnosis not present

## 2019-05-17 DIAGNOSIS — E1129 Type 2 diabetes mellitus with other diabetic kidney complication: Secondary | ICD-10-CM | POA: Diagnosis not present

## 2019-05-17 MED ORDER — ATORVASTATIN CALCIUM 20 MG PO TABS: 20.0000 mg | ORAL_TABLET | Freq: Every day | ORAL | 1 refills | Status: DC

## 2019-05-17 NOTE — Progress Notes (Signed)
Telemedicine Encounter: Disclosed to patient at start of encounter that we will provide appropriate telemedicine services.  Patient consents to be treated via phone prior to discussion. - Patient is at his home and is accessed via telephone. - Services are provided by Cassell Smiles from [facility].   Subjective:    Patient ID: Stephen [name], male    DOB: 28-Sep-1975, 43 y.o.   MRN: [id number]  Stephen [name] is a 43 y.o. male presenting on 05/17/2019 for Diabetes  HPI Diabetes Pt presents today for follow up of Type 2 diabetes mellitus. He is checking CBG at home. - Current diabetic medications include: none: diet and lifestyle - He is not currently symptomatic.  - He denies polydipsia, polyphagia, polyuria, headaches, diaphoresis, shakiness, chills, pain, numbness or tingling in extremities and changes in vision.   - Clinical course has been stable. - He  reports no regular exercise routine, but is resuming this with opening of gyms. - His diet is low in salt, moderate in fat, and low in carbohydrates. - Weight trend: gradually increasing (approx 15 lbs per patient estimate) with reduced gym time due to Covid.  Patient is returning to the gym for resuming walking/treadmill.  PREVENTION: Eye exam current (within one year): no Foot exam current (within one year): yes  Lipid/ASCVD risk reduction - on statin: yes Kidney protection - on ace or arb: yes Recent Labs    11/12/18 0757 05/14/19 0805  HGBA1C 6.0* 6.0*     Social History   Tobacco Use  . Smoking status: Former Smoker    Packs/day: 0.10    Years: 3.00    Pack years: 0.30    Types: Cigarettes    Quit date: 08/15/1994    Years since quitting: 24.7  . Smokeless tobacco: Never Used  . Tobacco comment: weekend smoker, pt would only smoke bout 1 -2 cig on the weekend.   Substance Use Topics  . Alcohol use: No    Comment: on week-ends  . Drug use: No   Review of Systems Per HPI unless specifically  indicated above    Objective:    BP 112/68   Pulse 83   Wt Readings from Last 3 Encounters:  03/12/19 175 lb (79.4 kg)  06/05/18 180 lb (81.6 kg)  05/18/18 177 lb 12.8 oz (80.6 kg)    Physical Exam Patient remotely monitored.  Verbal communication appropriate.  Cognition normal.   Results for orders placed or performed in visit on 05/13/19  COMPLETE METABOLIC PANEL WITH GFR  Result Value Ref Range   Glucose, Bld 117 (H) 65 - 99 mg/dL   BUN 12 7 - 25 mg/dL   Creat 0.83 0.60 - 1.35 mg/dL   GFR, Est Non African American 107 > OR = 60 mL/min/1.47m2   GFR, Est African American 124 > OR = 60 mL/min/1.5m2   BUN/Creatinine Ratio NOT APPLICABLE 6 - 22 (calc)   Sodium 139 135 - 146 mmol/L   Potassium 4.3 3.5 - 5.3 mmol/L   Chloride 103 98 - 110 mmol/L   CO2 26 20 - 32 mmol/L   Calcium 9.4 8.6 - 10.3 mg/dL   Total Protein 6.7 6.1 - 8.1 g/dL   Albumin 4.4 3.6 - 5.1 g/dL   Globulin 2.3 1.9 - 3.7 g/dL (calc)   AG Ratio 1.9 1.0 - 2.5 (calc)   Total Bilirubin 1.0 0.2 - 1.2 mg/dL   Alkaline phosphatase (APISO) 71 36 - 130 U/L   AST 81 (H) 10 -  40 U/L   ALT 78 (H) 9 - 46 U/L  Lipid panel  Result Value Ref Range   Cholesterol 133 <200 mg/dL   HDL 45 > OR = 40 mg/dL   Triglycerides 73 <150 mg/dL   LDL Cholesterol (Calc) 73 mg/dL (calc)   Total CHOL/HDL Ratio 3.0 <5.0 (calc)   Non-HDL Cholesterol (Calc) 88 <130 mg/dL (calc)  Hemoglobin A1c  Result Value Ref Range   Hgb A1c MFr Bld 6.0 (H) <5.7 % of total Hgb   Mean Plasma Glucose 126 (calc)   eAG (mmol/L) 7.0 (calc)      Assessment & Plan:   Problem List Items Addressed This Visit      Endocrine   Type 2 diabetes mellitus with microalbuminuria, without long-term current use of insulin (HCC)   Relevant Medications   atorvastatin (LIPITOR) 20 MG tablet   Other Relevant Orders   Comprehensive metabolic panel   Lipid panel    Well-controlledDM with A1c 6.0 stable from 6 months ago and goal A1c < 7.0%. - Complications -  dyslipidemia.  Plan:  1. Continue current therapy: lifestyle only 2. Encourage improved lifestyle: - low carb/low glycemic diet reinforced prior education - Increase physical activity to 30 minutes most days of the week.  Explained that increased physical activity increases body's use of sugar for energy. 3. Check fasting am CBG and bring log to next visit for review 4. Continue ASA, ACEi and Statin  - Labs in 3 months.  Titration of atorvastatin by Dr. Fletcher Anon in 3 months if needed for cardiac risk reduction (LDL goal < 70).  Lowered today to 20 mg from 40 mg due to elevation of liver enzymes. 5. Advised to schedule DM ophtho exam, send record. 6. Follow-up 6 months in clinic, 3 months for fasting labs   Meds ordered this encounter  Medications  . atorvastatin (LIPITOR) 20 MG tablet    Sig: Take 1 tablet (20 mg total) by mouth daily.    Dispense:  90 tablet    Refill:  1    Order Specific Question:   Supervising Provider    Answer:   Olin Hauser [[id number]]   - Time spent in direct consultation with patient via telemedicine about above concerns: 8 minutes  Follow up plan: Follow-up 6 months in clinic, 3 months for fasting labs   Cassell Smiles, [place], AGPCNP-BC Adult Gerontology Primary Care Nurse Practitioner [facility] 05/17/2019, 8:07 AM

## 2019-09-10 ENCOUNTER — Ambulatory Visit (INDEPENDENT_AMBULATORY_CARE_PROVIDER_SITE_OTHER): Payer: Managed Care, Other (non HMO) | Admitting: Cardiovascular Disease

## 2019-09-10 ENCOUNTER — Other Ambulatory Visit: Payer: Self-pay

## 2019-09-10 ENCOUNTER — Encounter: Payer: Self-pay | Admitting: Cardiovascular Disease

## 2019-09-10 VITALS — BP 100/68 | HR 73 | Ht 70.0 in | Wt 199.2 lb

## 2019-09-10 DIAGNOSIS — I2510 Atherosclerotic heart disease of native coronary artery without angina pectoris: Secondary | ICD-10-CM | POA: Diagnosis not present

## 2019-09-10 DIAGNOSIS — E782 Mixed hyperlipidemia: Secondary | ICD-10-CM | POA: Diagnosis not present

## 2019-09-10 NOTE — Progress Notes (Signed)
Cardiology Office Note   Date:  09/10/2019   ID:  Stephen [name], DOB 11-Dec-1975, MRN [id number]  PCP:  Mikey College, NP (Inactive)  Cardiologist:   Kathlyn Sacramento, MD   Chief Complaint  Patient presents with  . other    6 month follow up. Meds reviewed by the pt. verbally. "doing well."       History of Present Illness: Stephen [name] is a 44 y.o. male who is here today for a follow-up regarding coronary artery disease.  He has known history of type 2 diabetes and hyperlipidemia.   He had unstable angina in September 2018 with abnormal nuclear stress test.  EF was normal by echo. Cardiac catheterization  showed significant underlying 2 -vessel coronary artery disease with subacute thrombotic occlusion of the mid to distal right coronary artery with left-to-right collaterals, occluded OM 3 with left to left collaterals, significant stenosis in a large OM 2 and moderate disease in second diagonal. The LAD was mildly and diffusely diseased. Ejection fraction was normal. Left ventricular end-diastolic pressure was high normal. He was treated medically initially but had worsening angina and thus  underwent successful PCI and drug-eluting stent placement to OM2.  The RCA was left to be treated medically given the well-developed collaterals.  He has been doing well with no recent chest pain, shortness of breath or palpitations.  He gained about 15 pounds since last summer due to inability to go to the gym.  He takes his medications regularly. The dose of atorvastatin was decreased in September to 20 mg daily given abnormal liver enzymes.    Past Medical History:  Diagnosis Date  . CAD (coronary artery disease)    a. LHC 04/2017: D2 60%, pLCx 30%, OM2 85%, OM3 100%L-L collats, m-dRCA 100% L-R collats, LVEF 55-65%; b. LHC 05/15/2017: D2 60%, pLCx 30%, OM2-1 85% s/p PCI/DES, OM2-2 30%, OM3 100% L-L collats, m-dRCA 100% L-R collats    . Diabetes mellitus with complication ([place])   . History  of echocardiogram    a. 04/2017: EF 55-60%, normal wall motion, normal LV diastolic function, no significant valvular abnormalities  . Hyperlipidemia   . Kidney stones 2002, 2008   no stone eval  . Myocardial infarction [facility])     Past Surgical History:  Procedure Laterality Date  . APPENDECTOMY    . CORONARY STENT INTERVENTION N/A 05/15/2017   Procedure: CORONARY STENT INTERVENTION;  Surgeon: Wellington Hampshire, MD;  Location: [place] CV LAB;  Service: Cardiovascular;  Laterality: N/A;  . LEFT HEART CATH AND CORONARY ANGIOGRAPHY Left 04/24/2017   Procedure: LEFT HEART CATH AND CORONARY ANGIOGRAPHY;  Surgeon: Wellington Hampshire, MD;  Location: [place] [facility];  Service: Cardiovascular;  Laterality: Left;     Current Outpatient Medications  Medication Sig Dispense Refill  . acetaminophen (TYLENOL) 500 MG tablet Take 500-1,000 mg by mouth every 6 (six) hours as needed (for pain/headaches.).    Marland Kitchen aspirin EC 81 MG tablet Take 1 tablet (81 mg total) by mouth daily. (Patient taking differently: Take 81 mg by mouth at bedtime. ) 90 tablet 3  . atorvastatin (LIPITOR) 20 MG tablet Take 1 tablet (20 mg total) by mouth daily. 90 tablet 1  . glucose blood (CONTOUR NEXT TEST) test strip Use as instructed to check blood sugar up to twice daily. 200 each 4  . Icosapent Ethyl (VASCEPA) 1 g CAPS Take 2 capsules (2 g total) by mouth 2 (two) times daily. 360 capsule 3  .  lisinopril (PRINIVIL,ZESTRIL) 2.5 MG tablet Take 1 tablet (2.5 mg total) by mouth daily. 90 tablet 4  . Microlet Lancets MISC USE TO CHECK GLUCOSE UP TO 2 TIMES DAILY AS DIRECTED 180 each 4  . nitroGLYCERIN (NITROSTAT) 0.4 MG SL tablet Place 1 tablet (0.4 mg total) under the tongue every 5 (five) minutes as needed for chest pain. 25 tablet 3   No current facility-administered medications for this visit.    Allergies:   Patient has no known allergies.    Social History:  The patient  reports that he quit smoking about 25 years  ago. His smoking use included cigarettes. He has a 0.30 pack-year smoking history. He has never used smokeless tobacco. He reports that he does not drink alcohol or use drugs.   Family History:  [name] family history includes CAD (age of onset: 29) in his mother; Cancer in his father; Cholecystitis in his father; Chronic Renal Failure in his mother; Diabetes in his father, paternal uncle, and sister; Healthy in his sister, son, and son; Heart disease in his mother; Hyperlipidemia in his mother; Hypertension in his mother; Hyperthyroidism in his sister, sister, and sister; Lung cancer in his paternal aunt; Multiple sclerosis in his sister; Pancreatic cancer in his father and paternal uncle; Stroke in his paternal uncle; Thyroid disease in his mother.    ROS:  Please see the history of present illness.   Otherwise, review of systems are positive for none.   All other systems are reviewed and negative.    PHYSICAL EXAM: VS:  BP 100/68 (BP Location: Left Arm, Patient Position: Sitting, Cuff Size: Normal)   Pulse 73   Ht 5\' 10"  (1.778 m)   Wt 199 lb 4 oz (90.4 kg)   BMI 28.59 kg/m  , BMI Body mass index is 28.59 kg/m. GEN: Well nourished, well developed, in no acute distress  HEENT: normal  Neck: no JVD, carotid bruits, or masses Cardiac: RRR; no murmurs, rubs, or gallops,no edema  Respiratory:  clear to auscultation bilaterally, normal work of breathing GI: soft, nontender, nondistended, + BS MS: no deformity or atrophy  Skin: warm and dry, no rash Neuro:  Strength and sensation are intact Psych: euthymic mood, full affect  EKG:  EKG is  ordered today. EKG showed normal sinus rhythm with nonspecific T wave changes.  Recent Labs: 05/14/2019: ALT 78; BUN 12; Creat 0.83; Potassium 4.3; Sodium 139    Lipid Panel    Component Value Date/Time   CHOL 133 05/14/2019 0805   TRIG 73 05/14/2019 0805   HDL 45 05/14/2019 0805   CHOLHDL 3.0 05/14/2019 0805   VLDL 56 (H) 04/14/2017 1137     LDLCALC 73 05/14/2019 0805      Wt Readings from Last 3 Encounters:  09/10/19 199 lb 4 oz (90.4 kg)  03/12/19 175 lb (79.4 kg)  06/05/18 180 lb (81.6 kg)        PAD Screen 04/18/2017  Previous PAD dx? No  Previous surgical procedure? No  Pain with walking? No  Feet/toe relief with dangling? No  Painful, non-healing ulcers? No  Extremities discolored? No      ASSESSMENT AND PLAN:  1.  Coronary artery disease involving native coronary arteries without angina: He is doing very well without angina.  Continue medical therapy.  Continue aspirin indefinitely.  2.  Mixed hyperlipidemia: Continue treatment with atorvastatin and Vascepa.his atorvastatin was decreased to 20 mg daily due to abnormal liver enzymes.  I am going to repeat lipid  and liver profile today and consider switching to rosuvastatin.  3. Type 2 diabetes: He is off medications and most recent hemoglobin A1c was 6.     Disposition:   FU with me in 6 months.  Signed,  Kathlyn Sacramento, MD  09/10/2019 2:47 PM    [place]

## 2019-09-10 NOTE — Patient Instructions (Signed)
Medication Instructions:  No medication changes. *If you need a refill on your cardiac medications before your next appointment, please call your pharmacy*  Lab Work: Lipid and liver panel today. If you have labs (blood work) drawn today and your tests are completely normal, you will receive your results only by: Marland Kitchen MyChart Message (if you have MyChart) OR . A paper copy in the mail If you have any lab test that is abnormal or we need to change your treatment, we will call you to review the results.  Testing/Procedures: None ordered  Follow-Up: At [facility], you and your health needs are our priority.  As part of our continuing mission to provide you with exceptional heart care, we have created designated Provider Care Teams.  These Care Teams include your primary Cardiologist (physician) and Advanced Practice Providers (APPs -  Physician Assistants and Nurse Practitioners) who all work together to provide you with the care you need, when you need it.  Your next appointment:   6 month(s)  The format for your next appointment:   In Person  Provider:    You may see Kathlyn Sacramento, MD or one of the following Advanced Practice Providers on your designated Care Team:    Murray Hodgkins, NP  Christell Faith, PA-C  Marrianne Mood, PA-C   Other Instructions NA

## 2019-09-11 ENCOUNTER — Telehealth: Payer: Self-pay

## 2019-09-11 DIAGNOSIS — E782 Mixed hyperlipidemia: Secondary | ICD-10-CM

## 2019-09-11 LAB — LIPID PANEL
Chol/HDL Ratio: 4.2 ratio (ref 0.0–5.0)
Cholesterol, Total: 160 mg/dL (ref 100–199)
HDL: 38 mg/dL — ABNORMAL LOW (ref >39)
LDL Chol Calc (NIH): 81 mg/dL (ref 0–99)
Triglycerides: 246 mg/dL — ABNORMAL HIGH (ref 0–149)
VLDL Cholesterol Cal: 41 mg/dL — ABNORMAL HIGH (ref 5–40)

## 2019-09-11 LAB — HEPATIC FUNCTION PANEL
ALT: 113 IU/L — ABNORMAL HIGH (ref 0–44)
AST: 103 IU/L — ABNORMAL HIGH (ref 0–40)
Albumin: 4.6 g/dL (ref 4.0–5.0)
Alkaline Phosphatase: 101 IU/L (ref 39–117)
Bilirubin Total: 0.7 mg/dL (ref 0.0–1.2)
Bilirubin, Direct: 0.16 mg/dL (ref 0.00–0.40)
Total Protein: 7 g/dL (ref 6.0–8.5)

## 2019-09-11 MED ORDER — ROSUVASTATIN CALCIUM 10 MG PO TABS: 10.0000 mg | ORAL_TABLET | Freq: Every day | ORAL | 11 refills | Status: DC

## 2019-09-11 NOTE — Telephone Encounter (Signed)
-----   Message from Wellington Hampshire, MD sent at 09/11/2019  3:21 PM EST ----- Inform patient that labs showed worsening liver function test.  In addition, his cholesterol is worse and triglyceride is high again. Recommend switching atorvastatin to rosuvastatin 10 mg daily.  Avoid alcoholic drinks.  He needs to start working on improved diet, exercise and weight loss. Repeat fasting lipid and liver profile in 6 weeks.

## 2019-09-11 NOTE — Telephone Encounter (Signed)
Patient made aware of lab results and Dr. Tyrell Antonio recommendation with verbalized understanding. Rx sent to the patients pharmacy for Rosuvastatin 10 mg daily. Lab orders are in Epic for 6 weeks fasting lipid and lfts to be drawn at [facility].

## 2019-10-18 ENCOUNTER — Ambulatory Visit: Payer: Managed Care, Other (non HMO) | Attending: Internal Medicine

## 2019-10-18 DIAGNOSIS — Z23 Encounter for immunization: Secondary | ICD-10-CM

## 2019-10-18 NOTE — Progress Notes (Signed)
   Covid-19 Vaccination Clinic  Name:  Stephen [name]    MRN: [id number] DOB: [birth year]  10/18/2019  Mr. Stephen [name] was observed post Covid-19 immunization for 15 minutes without incident. He was provided with Vaccine Information Sheet and instruction to access the V-Safe system.   Mr. Stephen [name] was instructed to call 911 with any severe reactions post vaccine: Marland Kitchen Difficulty breathing  . Swelling of face and throat  . A fast heartbeat  . A bad rash all over body  . Dizziness and weakness

## 2019-10-28 ENCOUNTER — Other Ambulatory Visit: Payer: Self-pay

## 2019-10-28 ENCOUNTER — Other Ambulatory Visit
Admission: RE | Admit: 2019-10-28 | Discharge: 2019-10-28 | Disposition: A | Discharge status: Home / Self Care | Payer: Managed Care, Other (non HMO) | Attending: Cardiovascular Disease | Admitting: Cardiovascular Disease

## 2019-10-28 DIAGNOSIS — E782 Mixed hyperlipidemia: Secondary | ICD-10-CM

## 2019-10-28 LAB — LIPID PANEL
Cholesterol: 145 mg/dL (ref 0–200)
HDL: 40 mg/dL — ABNORMAL LOW (ref >40)
LDL Cholesterol: 87 mg/dL (ref 0–99)
Total CHOL/HDL Ratio: 3.6 RATIO
Triglycerides: 92 mg/dL (ref <150)
VLDL: 18 mg/dL (ref 0–40)

## 2019-10-28 LAB — HEPATIC FUNCTION PANEL
ALT: 125 U/L — ABNORMAL HIGH (ref 0–44)
AST: 110 U/L — ABNORMAL HIGH (ref 15–41)
Albumin: 4.3 g/dL (ref 3.5–5.0)
Alkaline Phosphatase: 63 U/L (ref 38–126)
Bilirubin, Direct: <0.1 mg/dL (ref 0.0–0.2)
Total Bilirubin: 0.9 mg/dL (ref 0.3–1.2)
Total Protein: 7.3 g/dL (ref 6.5–8.1)

## 2019-10-29 ENCOUNTER — Telehealth: Payer: Self-pay

## 2019-10-29 DIAGNOSIS — E782 Mixed hyperlipidemia: Secondary | ICD-10-CM

## 2019-10-29 MED ORDER — EZETIMIBE 10 MG PO TABS: 10.0000 mg | ORAL_TABLET | Freq: Every day | ORAL | 5 refills | Status: DC

## 2019-10-29 NOTE — Telephone Encounter (Signed)
-----   Message from Wellington Hampshire, MD sent at 10/29/2019 10:25 AM EDT ----- That indicates that all statins might be causing liver abnormality.  Stop rosuvastatin.  Add Zetia 10 mg daily.  Repeat fasting lipid and liver profile in 2 months.  If we cannot control his cholesterol with Zetia, we will have to consider treatment with a PCSK9 inhibitor.

## 2019-10-29 NOTE — Telephone Encounter (Signed)
-----   Message from Wellington Hampshire, MD sent at 10/28/2019  5:02 PM EDT ----- Liver enzymes are still elevated.  Lipid profile showed improvement in triglyceride but LDL is still not at target.  Switch atorvastatin to rosuvastatin 10 mg daily.  Continue Vascepa .  Repeat fasting lipid and liver profile in 2 months.

## 2019-10-29 NOTE — Telephone Encounter (Signed)
Patient made aware of Dr. Tyrell Antonio response and recommendation. Rosuvastatin d/c. Rx for Zetia 10 mg qd sent to the patients pharmacy. Labs orders arre in Epic for 2 months fasting lipid [facility]. Patient verbalized understanding.

## 2019-10-29 NOTE — Telephone Encounter (Signed)
Patient made aware of lab results and Dr. Tyrell Antonio recommendation. He is already taking rosuvastatin 10 mg qd. This change was made in Jan 2021. Adv the patient that I will fwd the msg back to Dr. Fletcher Anon and call back with his response.

## 2019-11-13 ENCOUNTER — Ambulatory Visit: Payer: Managed Care, Other (non HMO) | Attending: Internal Medicine

## 2019-11-13 DIAGNOSIS — Z23 Encounter for immunization: Secondary | ICD-10-CM

## 2019-11-13 NOTE — Progress Notes (Signed)
   Covid-19 Vaccination Clinic  Name:  Stephen [name]    MRN: [id number] DOB: 1976-07-29  11/13/2019  [name] was observed post Covid-19 immunization for 15 minutes without incident. He was provided with Vaccine Information Sheet and instruction to access the V-Safe system.   [name] was instructed to call 911 with any severe reactions post vaccine: Marland Kitchen Difficulty breathing  . Swelling of face and throat  . A fast heartbeat  . A bad rash all over body  . Dizziness and weakness   Immunizations Administered    Name Date Dose VIS Date Route   Pfizer COVID-19 Vaccine 11/13/2019 11:49 AM 0.3 mL 07/26/2019 Intramuscular   Manufacturer: [place]   Lot: [id number]   [place]: [id number]

## 2019-11-19 ENCOUNTER — Other Ambulatory Visit: Payer: Self-pay

## 2019-11-19 ENCOUNTER — Encounter: Payer: Self-pay | Admitting: Family Medicine

## 2019-11-19 ENCOUNTER — Other Ambulatory Visit: Payer: Self-pay | Admitting: Family Medicine

## 2019-11-19 ENCOUNTER — Ambulatory Visit: Payer: Managed Care, Other (non HMO) | Admitting: Family Medicine

## 2019-11-19 VITALS — BP 103/63 | HR 66 | Temp 97.3°F | Ht 70.0 in | Wt 193.4 lb

## 2019-11-19 DIAGNOSIS — E1129 Type 2 diabetes mellitus with other diabetic kidney complication: Secondary | ICD-10-CM

## 2019-11-19 DIAGNOSIS — I2510 Atherosclerotic heart disease of native coronary artery without angina pectoris: Secondary | ICD-10-CM

## 2019-11-19 DIAGNOSIS — E1169 Type 2 diabetes mellitus with other specified complication: Secondary | ICD-10-CM | POA: Diagnosis not present

## 2019-11-19 DIAGNOSIS — E663 Overweight: Secondary | ICD-10-CM | POA: Diagnosis not present

## 2019-11-19 DIAGNOSIS — R748 Abnormal levels of other serum enzymes: Secondary | ICD-10-CM | POA: Diagnosis not present

## 2019-11-19 DIAGNOSIS — E785 Hyperlipidemia, unspecified: Secondary | ICD-10-CM

## 2019-11-19 DIAGNOSIS — R809 Proteinuria, unspecified: Secondary | ICD-10-CM | POA: Diagnosis not present

## 2019-11-19 DIAGNOSIS — Z0000 Encounter for general adult medical examination without abnormal findings: Secondary | ICD-10-CM

## 2019-11-19 DIAGNOSIS — Z789 Other specified health status: Secondary | ICD-10-CM

## 2019-11-19 LAB — POCT GLYCOSYLATED HEMOGLOBIN (HGB A1C): Hemoglobin A1C: 6 % — AB (ref 4.0–5.6)

## 2019-11-19 NOTE — Patient Instructions (Addendum)
Thank you for coming to the office today.  Recent Labs    05/14/19 0805 11/19/19 0841  HGBA1C 6.0* 6.0*   Keep up the great work with goals for exercise lifestyle weight loss  F/u with Dr Fletcher Anon for next labs again for liver and cholesterol If still elevated liver and need our  Help, let us know we can do other testing / referral.  Have eye doctor send [place] copy of diabetic eye report. Fax [phone]  DUE for FASTING BLOOD WORK (no food or drink after midnight before the lab appointment, only water or coffee without cream/sugar on the morning of)  SCHEDULE "Lab Only" visit in the morning at the clinic for lab draw in 6 MONTHS   - Make sure Lab Only appointment is at about 1 week before your next appointment, so that results will be available  For Lab Results, once available within 2-3 days of blood draw, you can can log in to MyChart online to view your results and a brief explanation. Also, we can discuss results at next follow-up visit.   Please schedule a Follow-up Appointment to: Return in about 6 months (around 05/20/2020) for Annual Physical.  If you have any other questions or concerns, please feel free to call the office or send a message through [place]. You may also schedule an earlier appointment if necessary.  Additionally, you may be receiving a survey about your experience at our office within a few days to 1 week by e-mail or mail. We value your feedback.  Nobie Putnam, DO [place]

## 2019-11-19 NOTE — Assessment & Plan Note (Signed)
Previously controlled on statin + Vascepa however had elevated LFT - statin intolerance Last lipid 10/2019 improved CAD s/p MI, DM2  Plan: 1. Continue current meds - Zetia 10mg  daily, Vascepa 2g BID 2. Continue ASA 81mg  for secondary ASCVD risk reduction 3. Encourage improved lifestyle - low carb/cholesterol, reduce portion size, continue improving regular exercise 4. Follow-up w cardiology for upcoming lipid / lft

## 2019-11-19 NOTE — Assessment & Plan Note (Signed)
Improving weight now Encourage continue improve lifestyle diet / exercise regimen resume 

## 2019-11-19 NOTE — Assessment & Plan Note (Signed)
Mild, seems stable to improved on last check 10/2019 Onset 04/2019, thought to be statin induced Has changed statins now off Atorva, Rosuva On Zetia Per Cardiology next lab in 2 months approx  Future f/u results can do further LFT work up or US imaging if indicated

## 2019-11-19 NOTE — Assessment & Plan Note (Addendum)
Well-controlled DM with A1c 6.0 stable despite some wt gain now improving wt Not on medication Complications - hyperlipidemia, CAD - increases risk of future cardiovascular complications  Plan:  1. Continue current diet controlled / lifestyle regimen 2. Encourage improved lifestyle - low carb, low sugar diet, reduce portion size, continue improving regular exercise 3. Check CBG, bring log to next visit for review 4. Continue ASA, ACEi, OFF Statin now on Zetia 5. DM Foot exam done today  6. Advised to schedule DM ophtho exam, send record - scheduled tomorrow 4/7 in [facility]  Future consider GLP1 agent for cardioprotective benefits

## 2019-11-19 NOTE — Assessment & Plan Note (Signed)
Stable without angina Controlled s/p stent placement and med management per Cardiology On ASA, ACEi, lipid meds

## 2019-11-19 NOTE — Assessment & Plan Note (Signed)
Elevated Liver Enzymes on Statin Cannot take Failed Atorvastatin Rosuvastatin

## 2019-11-19 NOTE — Progress Notes (Signed)
Subjective:    Patient ID: Stephen [name], male    DOB: 1975-10-06, 44 y.o.   MRN: [id number]  Stephen [name] is a 44 y.o. male presenting on 11/19/2019 for Diabetes (follow up)  Previous PCP Cassell Smiles, AGPCNP-BC   HPI   CHRONIC DM, Type 2 / Overweight BMI >27 Reports concerns w weight gain w covid. Prior A1c 6 CBGs: Avg 105-120, Low >100, High <150. Checks CBGs daily Meds: None Currently on ACEi Lifestyle: - Weight gain 15-20 lbs over past 1 year due to covid inactivity - Diet (He had recent elevated LFTs in 08/2019 and he stopped Alcohol)  - Exercise (previously lost significant weight in gym but has not returned, and has resumed walking more.) - Has upcoming new Eye Doctor in [facility] apt tomorrow 4/7 - fam history diabetes Denies hypoglycemia, polyuria, visual changes, numbness or tingling.  HYPERLIPIDEMIA / Elevated LFTs / CAD PMH MI x 3 in 2018, followed by Dr Fletcher Anon Cardiology - Reports concerns. Last lipid panel in past 3 months has had some elevated TG. His LDL was controlled on statin but elevated LFTs in past 6 months approx 04/2019 showed elevation. Thought atorvastatin does adjusted and then switch to Rosuvastatin and ultimately now off statin on Zetia. Awaiting further test by Cardiology Dr Fletcher Anon - Taking Vascepa 2g BID, and now Zetia 10mg  - Taking ASA 81   Health Maintenance: UTD COVID19 vaccine.  Depression screen [facility] 2/9 11/19/2019 05/18/2018 05/17/2017  Decreased Interest 0 0 0  Down, Depressed, Hopeless 0 0 0  PHQ - 2 Score 0 0 0    Social History   Tobacco Use  . Smoking status: Former Smoker    Packs/day: 0.10    Years: 3.00    Pack years: 0.30    Types: Cigarettes    Quit date: 08/15/1994    Years since quitting: 25.2  . Smokeless tobacco: Never Used  . Tobacco comment: weekend smoker, pt would only smoke bout 1 -2 cig on the weekend.   Substance Use Topics  . Alcohol use: No    Comment: on week-ends  . Drug use: No    Review of Systems Per HPI  unless specifically indicated above     Objective:    BP 103/63   Pulse 66   Temp (!) 97.3 F (36.3 C) (Temporal)   Ht 5\' 10"  (1.778 m)   Wt 193 lb 6.4 oz (87.7 kg)   SpO2 100%   BMI 27.75 kg/m   Wt Readings from Last 3 Encounters:  11/19/19 193 lb 6.4 oz (87.7 kg)  09/10/19 199 lb 4 oz (90.4 kg)  03/12/19 175 lb (79.4 kg)    Physical Exam Vitals and nursing note reviewed.  Constitutional:      General: He is not in acute distress.    Appearance: He is well-developed. He is not diaphoretic.     Comments: Well-appearing, comfortable, cooperative  HENT:     Head: Normocephalic and atraumatic.  Eyes:     General:        Right eye: No discharge.        Left eye: No discharge.     Conjunctiva/sclera: Conjunctivae normal.  Neck:     Thyroid: No thyromegaly.  Cardiovascular:     Rate and Rhythm: Normal rate and regular rhythm.     Heart sounds: Normal heart sounds. No murmur.  Pulmonary:     Effort: Pulmonary effort is normal. No respiratory distress.     Breath sounds: Normal breath  sounds. No wheezing or rales.  Musculoskeletal:        General: Normal range of motion.     Cervical back: Normal range of motion and neck supple.  Lymphadenopathy:     Cervical: No cervical adenopathy.  Skin:    General: Skin is warm and dry.     Findings: No erythema or rash.  Neurological:     Mental Status: He is alert and oriented to person, place, and time.  Psychiatric:        Behavior: Behavior normal.     Comments: Well groomed, good eye contact, normal speech and thoughts       Diabetic Foot Exam - Simple   Simple Foot Form Diabetic Foot exam was performed with the following findings: Yes 11/19/2019  8:47 AM  Visual Inspection No deformities, no ulcerations, no other skin breakdown bilaterally: Yes Sensation Testing Intact to touch and monofilament testing bilaterally: Yes Pulse Check Posterior Tibialis and Dorsalis pulse intact bilaterally: Yes Comments     Recent  Labs    05/14/19 0805 11/19/19 0841  HGBA1C 6.0* 6.0*     Results for orders placed or performed in visit on 11/19/19  POCT HgB A1C  Result Value Ref Range   Hemoglobin A1C 6.0 (A) 4.0 - 5.6 %      Assessment & Plan:   Problem List Items Addressed This Visit    Type 2 diabetes mellitus with microalbuminuria, without long-term current use of insulin ([place]) - Primary    Well-controlled DM with A1c 6.0 stable despite some wt gain now improving wt Not on medication Complications - hyperlipidemia, CAD - increases risk of future cardiovascular complications  Plan:  1. Continue current diet controlled / lifestyle regimen 2. Encourage improved lifestyle - low carb, low sugar diet, reduce portion size, continue improving regular exercise 3. Check CBG, bring log to next visit for review 4. Continue ASA, ACEi, OFF Statin now on Zetia 5. DM Foot exam done today  6. Advised to schedule DM ophtho exam, send record - scheduled tomorrow 4/7 in [facility]  Future consider GLP1 agent for cardioprotective benefits      Relevant Orders   POCT HgB A1C (Completed)   Statin intolerance    Elevated Liver Enzymes on Statin Cannot take Failed Atorvastatin Rosuvastatin      Overweight (BMI 25.0-29.9)    Improving weight now Encourage continue improve lifestyle diet / exercise regimen resume      Hyperlipidemia associated with type 2 diabetes mellitus ([place])    Previously controlled on statin + Vascepa however had elevated LFT - statin intolerance Last lipid 10/2019 improved CAD s/p MI, DM2  Plan: 1. Continue current meds - Zetia 10mg  daily, Vascepa 2g BID 2. Continue ASA 81mg  for secondary ASCVD risk reduction 3. Encourage improved lifestyle - low carb/cholesterol, reduce portion size, continue improving regular exercise 4. Follow-up w cardiology for upcoming lipid / lft        Elevated liver enzymes    Mild, seems stable to improved on last check 10/2019 Onset 04/2019, thought to be  statin induced Has changed statins now off Atorva, Rosuva On Zetia Per Cardiology next lab in 2 months approx  Future f/u results can do further LFT work up or US imaging if indicated      Coronary artery disease    Stable without angina Controlled s/p stent placement and med management per Cardiology On ASA, ACEi, lipid meds          No orders of  the defined types were placed in this encounter.     Follow up plan: Return in about 6 months (around 05/20/2020) for Annual Physical.  Future labs ordered for 05/19/20 LIPID CMET CBC A1c TSH   Nobie Putnam, DO [facility] 11/19/2019, 8:39 AM

## 2019-11-20 LAB — HM DIABETES EYE EXAM

## 2019-11-21 ENCOUNTER — Encounter: Payer: Self-pay | Admitting: Family Medicine

## 2019-12-09 ENCOUNTER — Other Ambulatory Visit: Payer: Self-pay | Admitting: Nurse Practitioner

## 2019-12-09 DIAGNOSIS — E1129 Type 2 diabetes mellitus with other diabetic kidney complication: Secondary | ICD-10-CM

## 2019-12-10 NOTE — Telephone Encounter (Signed)
Requested Prescriptions  Pending Prescriptions Disp Refills  . lisinopril (ZESTRIL) 2.5 MG tablet [Pharmacy Med Name: Lisinopril 2.5 MG Oral Tablet] 90 tablet 0    Sig: Take 1 tablet by mouth once daily     Cardiovascular:  ACE Inhibitors Failed - 12/09/2019 11:28 PM      Failed - Cr in normal range and within 180 days    Creat  Date Value Ref Range Status  05/14/2019 0.83 0.60 - 1.35 mg/dL Final         Failed - K in normal range and within 180 days    Potassium  Date Value Ref Range Status  05/14/2019 4.3 3.5 - 5.3 mmol/L Final         Passed - Patient is not pregnant      Passed - Last BP in normal range    BP Readings from Last 1 Encounters:  11/19/19 103/63         Passed - Valid encounter within last 6 months    Recent Outpatient Visits          3 weeks ago Type 2 diabetes mellitus with microalbuminuria, without long-term current use of insulin ([place])   [facility] Olin Hauser, DO   6 months ago Type 2 diabetes mellitus with microalbuminuria, without long-term current use of insulin [facility])   [facility] Merrilyn Puma, Jerrel Ivory, NP   1 year ago Mixed hyperlipidemia   [facility] Merrilyn Puma, Jerrel Ivory, NP   1 year ago Type 2 diabetes mellitus with microalbuminuria, without long-term current use of insulin [facility])   [facility] Merrilyn Puma, Jerrel Ivory, NP   1 year ago Type 2 diabetes mellitus with microalbuminuria, without long-term current use of insulin [facility])   [place], Jerrel Ivory, NP      Future Appointments            In 2 months Fletcher Anon, Mertie Clause, MD [facility] [place]

## 2019-12-20 ENCOUNTER — Other Ambulatory Visit: Payer: Self-pay | Admitting: Nurse Practitioner

## 2019-12-20 DIAGNOSIS — R809 Proteinuria, unspecified: Secondary | ICD-10-CM

## 2019-12-20 DIAGNOSIS — E1129 Type 2 diabetes mellitus with other diabetic kidney complication: Secondary | ICD-10-CM

## 2020-02-01 ENCOUNTER — Other Ambulatory Visit: Payer: Self-pay | Admitting: Nurse Practitioner

## 2020-02-01 DIAGNOSIS — E1129 Type 2 diabetes mellitus with other diabetic kidney complication: Secondary | ICD-10-CM

## 2020-02-01 NOTE — Telephone Encounter (Signed)
Requested Prescriptions  Pending Prescriptions Disp Refills  . CONTOUR NEXT TEST test strip [Pharmacy Med Name: Contour Next Test In Vitro Strip] 200 each 2    Sig: USE 1 TEST STRIP TO CHECK GLUCOSE UP TO TWICE DAILY AS INSTRUCTED     Endocrinology: Diabetes - Testing Supplies Passed - 02/01/2020 11:09 AM      Passed - Valid encounter within last 12 months    Recent Outpatient Visits          2 months ago Type 2 diabetes mellitus with microalbuminuria, without long-term current use of insulin ([place])   [facility], Devonne Doughty, DO   8 months ago Type 2 diabetes mellitus with microalbuminuria, without long-term current use of insulin [facility])   [facility] Merrilyn Puma, Jerrel Ivory, NP   1 year ago Mixed hyperlipidemia   [facility] Merrilyn Puma, Jerrel Ivory, NP   1 year ago Type 2 diabetes mellitus with microalbuminuria, without long-term current use of insulin [facility])   [facility] Merrilyn Puma, Jerrel Ivory, NP   1 year ago Type 2 diabetes mellitus with microalbuminuria, without long-term current use of insulin [facility])   [facility] Merrilyn Puma, Jerrel Ivory, NP      Future Appointments            In 3 weeks Wellington Hampshire, MD [facility]   In 3 months [place], Devonne Doughty, [facility], [facility]

## 2020-02-24 ENCOUNTER — Telehealth: Payer: Self-pay | Admitting: Family Medicine

## 2020-02-24 NOTE — Telephone Encounter (Signed)
Icosapent Ethyl (VASCEPA) 1 g CAPS Medication Date: 02/19/2019 Department: [facility] Ordering/Authorizing: Olin Hauser, DO   [employer] has contacted and states that any refills on this will require a PA . Pls reach out to them at [phone] with ref # [facility]

## 2020-02-25 ENCOUNTER — Other Ambulatory Visit: Payer: Self-pay

## 2020-02-25 ENCOUNTER — Ambulatory Visit (INDEPENDENT_AMBULATORY_CARE_PROVIDER_SITE_OTHER): Payer: Managed Care, Other (non HMO) | Admitting: Cardiovascular Disease

## 2020-02-25 ENCOUNTER — Encounter: Payer: Self-pay | Admitting: Cardiovascular Disease

## 2020-02-25 VITALS — BP 130/78 | HR 67 | Ht 70.0 in | Wt 205.0 lb

## 2020-02-25 DIAGNOSIS — R748 Abnormal levels of other serum enzymes: Secondary | ICD-10-CM | POA: Diagnosis not present

## 2020-02-25 DIAGNOSIS — E785 Hyperlipidemia, unspecified: Secondary | ICD-10-CM

## 2020-02-25 DIAGNOSIS — E1169 Type 2 diabetes mellitus with other specified complication: Secondary | ICD-10-CM

## 2020-02-25 DIAGNOSIS — I2510 Atherosclerotic heart disease of native coronary artery without angina pectoris: Secondary | ICD-10-CM | POA: Diagnosis not present

## 2020-02-25 NOTE — Patient Instructions (Signed)
Medication Instructions:  Your physician recommends that you continue on your current medications as directed. Please refer to the Current Medication list given to you today.  *If you need a refill on your cardiac medications before your next appointment, please call your pharmacy*   Lab Work: Lipid and hepatic today If you have labs (blood work) drawn today and your tests are completely normal, you will receive your results only by: Marland Kitchen MyChart Message (if you have MyChart) OR . A paper copy in the mail If you have any lab test that is abnormal or we need to change your treatment, we will call you to review the results.   Testing/Procedures: None ordered   Follow-Up: At [facility], you and your health needs are our priority.  As part of our continuing mission to provide you with exceptional heart care, we have created designated Provider Care Teams.  These Care Teams include your primary Cardiologist (physician) and Advanced Practice Providers (APPs -  Physician Assistants and Nurse Practitioners) who all work together to provide you with the care you need, when you need it.  We recommend signing up for the patient portal called "MyChart".  Sign up information is provided on this After Visit Summary.  MyChart is used to connect with patients for Virtual Visits (Telemedicine).  Patients are able to view lab/test results, encounter notes, upcoming appointments, etc.  Non-urgent messages can be sent to your provider as well.   To learn more about what you can do with MyChart, go to [email or web address].    Your next appointment:   6 month(s)  The format for your next appointment:   In Person  Provider:    You may see Kathlyn Sacramento, MD or one of the following Advanced Practice Providers on your designated Care Team:    Murray Hodgkins, NP  Christell Faith, PA-C  Marrianne Mood, PA-C    Other Instructions N/A

## 2020-02-25 NOTE — Progress Notes (Signed)
Cardiology Office Note   Date:  02/25/2020   ID:  Stephen [name], DOB [birth year], MRN [id number]  PCP:  Olin Hauser, DO  Cardiologist:   Kathlyn Sacramento, MD   Chief Complaint  Patient presents with  . office visit    6 month F/U; Meds verbally reviewed with patient.      History of Present Illness: Stephen [name] is a 44 y.o. male who is here today for a follow-up regarding coronary artery disease.  He has known history of type 2 diabetes and hyperlipidemia.   He had unstable angina in September 2018 with abnormal nuclear stress test.  EF was normal by echo. Cardiac catheterization  showed significant underlying 2 -vessel coronary artery disease with subacute thrombotic occlusion of the mid to distal right coronary artery with left-to-right collaterals, occluded OM 3 with left to left collaterals, significant stenosis in a large OM 2 and moderate disease in second diagonal. The LAD was mildly and diffusely diseased. Ejection fraction was normal. Left ventricular end-diastolic pressure was high normal. He was treated medically initially but had worsening angina and thus  underwent successful PCI and drug-eluting stent placement to OM2.  The RCA was left to be treated medically given the well-developed collaterals.  He had elevated liver enzymes about 100 on both atorvastatin and rosuvastatin.  Thus, I discontinued rosuvastatin in March and switch him to Zetia 10 mg daily.  In addition, he continues to take Vascepa 2 g twice daily.  He has been doing well with no recent chest pain or shortness of breath.  He denies excessive alcohol use.  He drinks 2 beers on the weekend.   Past Medical History:  Diagnosis Date  . CAD (coronary artery disease)    a. LHC 04/2017: D2 60%, pLCx 30%, OM2 85%, OM3 100%L-L collats, m-dRCA 100% L-R collats, LVEF 55-65%; b. LHC 05/15/2017: D2 60%, pLCx 30%, OM2-1 85% s/p PCI/DES, OM2-2 30%, OM3 100% L-L collats, m-dRCA 100% L-R collats    . History of  echocardiogram    a. 04/2017: EF 55-60%, normal wall motion, normal LV diastolic function, no significant valvular abnormalities  . Hyperlipidemia   . Kidney stones 2002, 2008   no stone eval  . Myocardial infarction [facility])     Past Surgical History:  Procedure Laterality Date  . APPENDECTOMY    . CARDIAC CATHETERIZATION    . CORONARY STENT INTERVENTION N/A 05/15/2017   Procedure: CORONARY STENT INTERVENTION;  Surgeon: Wellington Hampshire, MD;  Location: [place] CV [facility];  Service: Cardiovascular;  Laterality: N/A;  . LEFT HEART CATH AND CORONARY ANGIOGRAPHY Left 04/24/2017   Procedure: LEFT HEART CATH AND CORONARY ANGIOGRAPHY;  Surgeon: Wellington Hampshire, MD;  Location: [place] CV LAB;  Service: Cardiovascular;  Laterality: Left;     Current Outpatient Medications  Medication Sig Dispense Refill  . acetaminophen (TYLENOL) 500 MG tablet Take 500-1,000 mg by mouth every 6 (six) hours as needed (for pain/headaches.).    Marland Kitchen aspirin EC 81 MG tablet Take 81 mg by mouth at bedtime. Swallow whole.    . CONTOUR NEXT TEST test strip USE 1 TEST STRIP TO CHECK GLUCOSE UP TO TWICE DAILY AS INSTRUCTED 200 each 2  . ezetimibe (ZETIA) 10 MG tablet Take 1 tablet (10 mg total) by mouth daily. 30 tablet 5  . Icosapent Ethyl (VASCEPA) 1 g CAPS Take 2 capsules (2 g total) by mouth 2 (two) times daily. 360 capsule 3  . lisinopril (ZESTRIL) 2.5 MG tablet Take  1 tablet by mouth once daily 90 tablet 0  . Microlet Lancets MISC USE 1 LANCET TO CHECK BLOOD SUGAR TWICE DAILY AS DIRECTED 100 each 4  . nitroGLYCERIN (NITROSTAT) 0.4 MG SL tablet Place 1 tablet (0.4 mg total) under the tongue every 5 (five) minutes as needed for chest pain. 25 tablet 3  . aspirin EC 81 MG tablet Take 1 tablet (81 mg total) by mouth daily. (Patient taking differently: Take 81 mg by mouth at bedtime. ) 90 tablet 3   No current facility-administered medications for this visit.    Allergies:   Patient has no known allergies.     Social History:  The patient  reports that he quit smoking about 25 years ago. His smoking use included cigarettes. He has a 0.30 pack-year smoking history. He has never used smokeless tobacco. He reports current alcohol use. He reports that he does not use drugs.   Family History:  [name] family history includes CAD (age of onset: 58) in his mother; Cancer in his father; Cholecystitis in his father; Chronic Renal Failure in his mother; Diabetes in his father, paternal uncle, and sister; Healthy in his sister, son, and son; Heart disease in his mother; Hyperlipidemia in his mother; Hypertension in his mother; Hyperthyroidism in his sister, sister, and sister; Lung cancer in his paternal aunt; Multiple sclerosis in his sister; Pancreatic cancer in his father and paternal uncle; Stroke in his paternal uncle; Thyroid disease in his mother.    ROS:  Please see the history of present illness.   Otherwise, review of systems are positive for none.   All other systems are reviewed and negative.    PHYSICAL EXAM: VS:  BP 130/78 (BP Location: Left Arm, Patient Position: Sitting, Cuff Size: Normal)   Pulse 67   Ht 5\' 10"  (1.778 m)   Wt 205 lb (93 kg)   SpO2 97%   BMI 29.41 kg/m  , BMI Body mass index is 29.41 kg/m. GEN: Well nourished, well developed, in no acute distress  HEENT: normal  Neck: no JVD, carotid bruits, or masses Cardiac: RRR; no murmurs, rubs, or gallops,no edema  Respiratory:  clear to auscultation bilaterally, normal work of breathing GI: soft, nontender, nondistended, + BS MS: no deformity or atrophy  Skin: warm and dry, no rash Neuro:  Strength and sensation are intact Psych: euthymic mood, full affect  EKG:  EKG is  ordered today. EKG showed normal sinus rhythm with poor R wave progression in the anterior leads.  Recent Labs: 05/14/2019: BUN 12; Creat 0.83; Potassium 4.3; Sodium 139 10/28/2019: ALT 125    Lipid Panel    Component Value Date/Time   CHOL 145  10/28/2019 0800   CHOL 160 09/10/2019 1504   TRIG 92 10/28/2019 0800   HDL 40 (L) 10/28/2019 0800   HDL 38 (L) 09/10/2019 1504   CHOLHDL 3.6 10/28/2019 0800   VLDL 18 10/28/2019 0800   LDLCALC 87 10/28/2019 0800   LDLCALC 81 09/10/2019 1504   LDLCALC 73 05/14/2019 0805      Wt Readings from Last 3 Encounters:  02/25/20 205 lb (93 kg)  11/19/19 193 lb 6.4 oz (87.7 kg)  09/10/19 199 lb 4 oz (90.4 kg)        PAD Screen 04/18/2017  Previous PAD dx? No  Previous surgical procedure? No  Pain with walking? No  Feet/toe relief with dangling? No  Painful, non-healing ulcers? No  Extremities discolored? No      ASSESSMENT AND PLAN:  1.  Coronary artery disease involving native coronary arteries without angina: He is doing very well without angina.  Continue medical therapy.  Continue aspirin indefinitely.  2.  Mixed hyperlipidemia: Continue treatment with Zetia and Vascepa.  He had elevated liver enzymes on rosuvastatin and atorvastatin.  He has been off his statin since March.  I am going to recheck lipid and liver profile.  If liver enzymes are still elevated, I will refer him to GI.  3. Type 2 diabetes: He is off medications and most recent hemoglobin A1c was 6.     Disposition:   FU with me in 6 months.  Signed,  Kathlyn Sacramento, MD  02/25/2020 3:32 PM    [place]

## 2020-02-26 NOTE — Telephone Encounter (Signed)
Needs PA for Vascepa.  He has failed Atorvastatin, Rosuvastatin, Lovaza.  He is on Zetia now as well.  Nobie Putnam, [facility] 02/26/2020, 6:32 PM

## 2020-02-27 LAB — HEPATIC FUNCTION PANEL
ALT: 148 IU/L — ABNORMAL HIGH (ref 0–44)
AST: 146 IU/L — ABNORMAL HIGH (ref 0–40)
Albumin: 4.7 g/dL (ref 4.0–5.0)
Alkaline Phosphatase: 77 IU/L (ref 48–121)
Bilirubin Total: 0.5 mg/dL (ref 0.0–1.2)
Bilirubin, Direct: 0.13 mg/dL (ref 0.00–0.40)
Total Protein: 7.4 g/dL (ref 6.0–8.5)

## 2020-02-27 LAB — LIPID PANEL
Chol/HDL Ratio: 4.6 ratio (ref 0.0–5.0)
Cholesterol, Total: 231 mg/dL — ABNORMAL HIGH (ref 100–199)
HDL: 50 mg/dL (ref >39)
LDL Chol Calc (NIH): 137 mg/dL — ABNORMAL HIGH (ref 0–99)
Triglycerides: 245 mg/dL — ABNORMAL HIGH (ref 0–149)
VLDL Cholesterol Cal: 44 mg/dL — ABNORMAL HIGH (ref 5–40)

## 2020-02-27 NOTE — Telephone Encounter (Signed)
Approved.  

## 2020-02-28 ENCOUNTER — Telehealth: Payer: Self-pay

## 2020-02-28 ENCOUNTER — Telehealth: Payer: Self-pay | Admitting: *Deleted

## 2020-02-28 DIAGNOSIS — Z789 Other specified health status: Secondary | ICD-10-CM

## 2020-02-28 DIAGNOSIS — E785 Hyperlipidemia, unspecified: Secondary | ICD-10-CM

## 2020-02-28 DIAGNOSIS — E1169 Type 2 diabetes mellitus with other specified complication: Secondary | ICD-10-CM

## 2020-02-28 DIAGNOSIS — R748 Abnormal levels of other serum enzymes: Secondary | ICD-10-CM

## 2020-02-28 MED ORDER — REPATHA SURECLICK 140 MG/ML ~~LOC~~ SOAJ
1.0000 | SUBCUTANEOUS | 5 refills | Status: DC
Start: 2020-02-28 — End: 2020-09-02

## 2020-02-28 NOTE — Telephone Encounter (Addendum)
Patient made aware of lab results and Dr. Tyrell Antonio recommendation.  -STOP Zetia -START Repatha 140 mg every 14 days. -Referral placed to [place] at [facility] (patient lives in the area) Adv the patient that he will be contacted by their office to schedule.

## 2020-02-28 NOTE — Telephone Encounter (Signed)
-----   Message from Wellington Hampshire, MD sent at 02/28/2020 11:35 AM EDT ----- Inform patient that his liver enzymes are still elevated.  This is in spite of being off all statins. Recommend referral to a liver specialist (GI) for evaluation of this. In addition, his cholesterol and triglyceride are both worse.  I recommend stopping Zetia and starting Repatha 140 mg subcu every 2 weeks.  I am forwarding this to Dr. Parks Ranger to keep him informed.

## 2020-02-28 NOTE — Telephone Encounter (Signed)
Pt requiring PA for Repatha 140 mg/ml.  PA Submitted through covermymeds.  Awaiting Approval:  Our information has been submitted and will be reviewed by [facility]. You may close this dialog, return to your dashboard, and perform other tasks. An electronic determination will be received in CoverMyMeds within 72-120 hours. You can see the latest determination by locating this request on your dashboard or by reopening this request. You will receive a fax copy of the determination. If Stephen [name] has not responded in 120 hours, contact [employer] at [phone].

## 2020-03-03 ENCOUNTER — Encounter: Payer: Self-pay | Admitting: Gastroenterology

## 2020-03-03 NOTE — Telephone Encounter (Signed)
Medication has been approved. CaseId:[id number];Status:Approved;Review Type:Prior Auth;Coverage Start Date:02/28/2020;Coverage End Date:08/26/2020;

## 2020-03-09 ENCOUNTER — Other Ambulatory Visit: Payer: Self-pay | Admitting: Family Medicine

## 2020-03-09 DIAGNOSIS — E782 Mixed hyperlipidemia: Secondary | ICD-10-CM

## 2020-03-09 DIAGNOSIS — R809 Proteinuria, unspecified: Secondary | ICD-10-CM

## 2020-03-09 NOTE — Telephone Encounter (Signed)
Requested medications are due for refill today?  Yes  Requested medications are on active medication list?  Yes  Last Refill:    Lisinopril  - 12/10/2019  # 90 with no refills  VASCEPA - 02/19/2019  # 360 with 3 refills   Future visit scheduled?  Yes  Notes to Clinic:  Lisinopril failed RX refill protocol due to no serum potassium nor creatinine in the past 180 days.  Last labs were performed on 05/14/2019.    VASCEPA is not assigned to a protocol.

## 2020-04-21 ENCOUNTER — Ambulatory Visit (INDEPENDENT_AMBULATORY_CARE_PROVIDER_SITE_OTHER): Payer: Managed Care, Other (non HMO) | Admitting: Gastroenterology

## 2020-04-21 ENCOUNTER — Encounter: Payer: Self-pay | Admitting: Gastroenterology

## 2020-04-21 ENCOUNTER — Other Ambulatory Visit (INDEPENDENT_AMBULATORY_CARE_PROVIDER_SITE_OTHER): Payer: Managed Care, Other (non HMO)

## 2020-04-21 VITALS — BP 126/70 | HR 79 | Ht 70.0 in | Wt 199.1 lb

## 2020-04-21 DIAGNOSIS — R7401 Elevation of levels of liver transaminase levels: Secondary | ICD-10-CM

## 2020-04-21 DIAGNOSIS — E785 Hyperlipidemia, unspecified: Secondary | ICD-10-CM

## 2020-04-21 LAB — HEPATIC FUNCTION PANEL
ALT: 157 U/L — ABNORMAL HIGH (ref 0–53)
AST: 134 U/L — ABNORMAL HIGH (ref 0–37)
Albumin: 4.5 g/dL (ref 3.5–5.2)
Alkaline Phosphatase: 53 U/L (ref 39–117)
Bilirubin, Direct: 0.1 mg/dL (ref 0.0–0.3)
Total Bilirubin: 0.6 mg/dL (ref 0.2–1.2)
Total Protein: 7.1 g/dL (ref 6.0–8.3)

## 2020-04-21 LAB — PROTIME-INR
INR: 1 ratio (ref 0.8–1.0)
Prothrombin Time: 10.7 s (ref 9.6–13.1)

## 2020-04-21 LAB — CBC WITH DIFFERENTIAL/PLATELET
Basophils Absolute: 0 10*3/uL (ref 0.0–0.1)
Basophils Relative: 0.3 % (ref 0.0–3.0)
Eosinophils Absolute: 0.1 10*3/uL (ref 0.0–0.7)
Eosinophils Relative: 1 % (ref 0.0–5.0)
HCT: 44.3 % (ref 39.0–52.0)
Hemoglobin: 15.3 g/dL (ref 13.0–17.0)
Lymphocytes Relative: 29.2 % (ref 12.0–46.0)
Lymphs Abs: 1.7 10*3/uL (ref 0.7–4.0)
MCHC: 34.5 g/dL (ref 30.0–36.0)
MCV: 89.2 fl (ref 78.0–100.0)
Monocytes Absolute: 0.3 10*3/uL (ref 0.1–1.0)
Monocytes Relative: 4.8 % (ref 3.0–12.0)
Neutro Abs: 3.7 10*3/uL (ref 1.4–7.7)
Neutrophils Relative %: 64.7 % (ref 43.0–77.0)
Platelets: 215 10*3/uL (ref 150.0–400.0)
RBC: 4.96 Mil/uL (ref 4.22–5.81)
RDW: 13.2 % (ref 11.5–15.5)
WBC: 5.8 10*3/uL (ref 4.0–10.5)

## 2020-04-21 NOTE — Progress Notes (Signed)
Chief Complaint: Elevated liver enzymes   Referring Provider:     Wellington Hampshire, MD   HPI:     Stephen [name] is a 44 y.o. male with a history of CAD (PCI with DES 2018), diabetes (diet-controlled), HLD, referred to the [facility] Clinic for evaluation of elevated liver enzymes.  His Cardiologist, Dr. Fletcher Anon, has changed multiple medications due to elevated enzymes; changed atorvastatin to rosuvastatin 08/2019. Stopped rosuvastatin 10/2019 and changed to Zetia and Vascepa owing to ongoing elevated liver enzymes, Zetia stopped and started on Repatha SQ Q2 weeks in 02/2020. No repeat labs since then.   Normal liver enzymes 03/2017-05/2018, with mild elevation starting 10/2018 (AST/ALT 41/34), and have continued to uptrend to most recent peak of 146/148 on 02/25/2020, despite multiple medication changes outlined above.  Otherwise normal ALP, T bili, albumin.  No history of ascites, GI bleed, confusion.  No known personal or family history of hepatobiliary disease.  No new supplements.  No medication adjustments have been as outlined above in response to elevated liver enzymes. No dietary mods. No hx of blood transfusions, IVDA/IVDU. No more than 4 drinks/week.  Has gained 20# since start of Covid due to decreased exercise.   No known family history of CRC, GI malignancy, liver disease, pancreatic disease, or IBD.    Hepatic Function Latest Ref Rng & Units 02/25/2020 10/28/2019 09/10/2019  Total Protein 6.0 - 8.5 g/dL 7.4 7.3 7.0  Albumin 4.0 - 5.0 g/dL 4.7 4.3 4.6  AST 0 - 40 IU/L 146(H) 110(H) 103(H)  ALT 0 - 44 IU/L 148(H) 125(H) 113(H)  Alk Phosphatase 48 - 121 IU/L 77 63 101  Total Bilirubin 0.0 - 1.2 mg/dL 0.5 0.9 0.7  Bilirubin, Direct 0.00 - 0.40 mg/dL 0.13 <0.1 0.16      Past Medical History:  Diagnosis Date  . CAD (coronary artery disease)    a. LHC 04/2017: D2 60%, pLCx 30%, OM2 85%, OM3 100%L-L collats, m-dRCA 100% L-R collats, LVEF 55-65%; b. LHC 05/15/2017: D2  60%, pLCx 30%, OM2-1 85% s/p PCI/DES, OM2-2 30%, OM3 100% L-L collats, m-dRCA 100% L-R collats    . Diabetes ([place])   . Elevated cholesterol   . History of echocardiogram    a. 04/2017: EF 55-60%, normal wall motion, normal LV diastolic function, no significant valvular abnormalities  . Hyperlipidemia   . Kidney stones 2002, 2008   no stone eval  . Myocardial infarction [facility])      Past Surgical History:  Procedure Laterality Date  . APPENDECTOMY  2000  . CARDIAC CATHETERIZATION    . CORONARY STENT INTERVENTION N/A 05/15/2017   Procedure: CORONARY STENT INTERVENTION;  Surgeon: Wellington Hampshire, MD;  Location: [place] [facility];  Service: Cardiovascular;  Laterality: N/A;  . LEFT HEART CATH AND CORONARY ANGIOGRAPHY Left 04/24/2017   Procedure: LEFT HEART CATH AND CORONARY ANGIOGRAPHY;  Surgeon: Wellington Hampshire, MD;  Location: [place] [facility];  Service: Cardiovascular;  Laterality: Left;   Family History  Problem Relation Age of Onset  . Heart disease Mother   . Thyroid disease Mother   . CAD Mother 59  . Hypertension Mother   . Hyperlipidemia Mother   . Chronic Renal Failure Mother   . Cancer Father        pancreatic s/p Whipple procedure  . Diabetes Father   . Cholecystitis Father   . Pancreatic cancer Father   . Hyperthyroidism Sister   .  Lung cancer Paternal Aunt   . Diabetes Paternal Uncle   . Stroke Paternal Uncle   . Multiple sclerosis Sister   . Hyperthyroidism Sister   . Hyperthyroidism Sister   . Diabetes Sister   . Healthy Sister   . Healthy Son   . Healthy Son   . Pancreatic cancer Paternal Uncle   . Stomach cancer Paternal Aunt   . Vision loss Neg Hx   . Heart attack Neg Hx   . Breast cancer Neg Hx   . Colon cancer Neg Hx   . Prostate cancer Neg Hx   . Esophageal cancer Neg Hx    Social History   Tobacco Use  . Smoking status: Former Smoker    Packs/day: 0.10    Years: 3.00    Pack years: 0.30    Types: Cigarettes    Quit date: 08/15/1994      Years since quitting: 25.7  . Smokeless tobacco: Never Used  . Tobacco comment: weekend smoker, pt would only smoke bout 1 -2 cig on the weekend.   Vaping Use  . Vaping Use: Never used  Substance Use Topics  . Alcohol use: Yes    Comment: on week-ends  . Drug use: No   Current Outpatient Medications  Medication Sig Dispense Refill  . acetaminophen (TYLENOL) 500 MG tablet Take 500-1,000 mg by mouth every 6 (six) hours as needed (for pain/headaches.).    Marland Kitchen aspirin EC 81 MG tablet Take 1 tablet (81 mg total) by mouth daily. (Patient taking differently: Take 81 mg by mouth at bedtime. ) 90 tablet 3  . CONTOUR NEXT TEST test strip USE 1 TEST STRIP TO CHECK GLUCOSE UP TO TWICE DAILY AS INSTRUCTED 200 each 2  . Evolocumab (REPATHA SURECLICK) 563 MG/ML SOAJ Inject 1 pen into the skin every 14 (fourteen) days. 2 pen 5  . lisinopril (ZESTRIL) 2.5 MG tablet Take 1 tablet by mouth once daily 90 tablet 0  . Microlet Lancets MISC USE 1 LANCET TO CHECK BLOOD SUGAR TWICE DAILY AS DIRECTED 100 each 4  . VASCEPA 1 g capsule Take 2 capsules by mouth twice daily 360 capsule 0  . nitroGLYCERIN (NITROSTAT) 0.4 MG SL tablet Place 1 tablet (0.4 mg total) under the tongue every 5 (five) minutes as needed for chest pain. (Patient not taking: Reported on 04/21/2020) 25 tablet 3   No current facility-administered medications for this visit.   No Known Allergies   Review of Systems: All systems reviewed and negative except where noted in HPI.     Physical Exam:    Wt Readings from Last 3 Encounters:  04/21/20 199 lb 2 oz (90.3 kg)  02/25/20 205 lb (93 kg)  11/19/19 193 lb 6.4 oz (87.7 kg)    BP 126/70   Pulse 79   Ht '5\' 10"'  (1.778 m)   Wt 199 lb 2 oz (90.3 kg)   BMI 28.57 kg/m  Constitutional:  Pleasant, in no acute distress. Psychiatric: Normal mood and affect. Behavior is normal. EENT: Pupils normal.  Conjunctivae are normal. No scleral icterus. Neck supple. No cervical LAD. Cardiovascular:  Normal rate, regular rhythm. No edema Pulmonary/chest: Effort normal and breath sounds normal. No wheezing, rales or rhonchi. Abdominal: Soft, nondistended, nontender. Bowel sounds active throughout. There are no masses palpable. No hepatomegaly. Neurological: Alert and oriented to person place and time. Skin: Skin is warm and dry. No rashes noted.   ASSESSMENT AND PLAN;   1) Elevated AST/ALT Unclear etiology for elevated liver  enzymes, but certainly  DILI at the top of the list.  He and his Cardiologist had made several appropriate medication adjustments in response to elevated liver enzymes, but these continue to uptrend.  No repeat enzymes since the most recent medication adjustment, and will evaluate as below:  -Repeat LAEs -Check CBC, INR to ensure preservied hepatic synthetic fx -If liver enzymes still uptrending, plan for RUQ ultrasound and full serologic work-up to rule out concomitant liver disease -RTC in 3 months or sooner as needed  2) Hyperlipidemia 3) History of CAD -Follows closely with his Cardiologist   Lavena Bullion, DO, FACG  04/21/2020, 2:18 PM   Wellington Hampshire, MD

## 2020-04-21 NOTE — Patient Instructions (Signed)
If you are age 45 or older, your body mass index should be between 23-30. Your Body mass index is 28.57 kg/m. If this is out of the aforementioned range listed, please consider follow up with your Primary Care Provider.  If you are age 16 or younger, your body mass index should be between 19-25. Your Body mass index is 28.57 kg/m. If this is out of the aformentioned range listed, please consider follow up with your Primary Care Provider.   Your provider has requested that you go to the basement level for lab work at [facility]. [facility] [place] before leaving today. Press "B" on the elevator. The lab is located at the first door on the left as you exit the elevator.  Follow up with me in three months.  Please call the office for an appointment as the schedule is not available at this time.  It was a pleasure to see you today!  Vito Cirigliano, D.O.

## 2020-04-24 ENCOUNTER — Other Ambulatory Visit: Payer: Self-pay

## 2020-04-24 ENCOUNTER — Telehealth: Payer: Self-pay

## 2020-04-24 DIAGNOSIS — R7989 Other specified abnormal findings of blood chemistry: Secondary | ICD-10-CM

## 2020-04-24 NOTE — Telephone Encounter (Signed)
Labs and [place] scheduled for 05/04/20.  Letter mailed to patient with details.

## 2020-05-04 ENCOUNTER — Ambulatory Visit (HOSPITAL_COMMUNITY)
Admission: RE | Admit: 2020-05-04 | Discharge: 2020-05-04 | Disposition: A | Discharge status: Home / Self Care | Payer: Managed Care, Other (non HMO) | Source: Ambulatory Visit | Attending: Gastroenterology | Admitting: Gastroenterology

## 2020-05-04 ENCOUNTER — Other Ambulatory Visit (INDEPENDENT_AMBULATORY_CARE_PROVIDER_SITE_OTHER): Payer: Managed Care, Other (non HMO)

## 2020-05-04 ENCOUNTER — Other Ambulatory Visit: Payer: Self-pay

## 2020-05-04 DIAGNOSIS — E785 Hyperlipidemia, unspecified: Secondary | ICD-10-CM

## 2020-05-04 DIAGNOSIS — Z0000 Encounter for general adult medical examination without abnormal findings: Secondary | ICD-10-CM

## 2020-05-04 DIAGNOSIS — R945 Abnormal results of liver function studies: Secondary | ICD-10-CM | POA: Insufficient documentation

## 2020-05-04 DIAGNOSIS — R7989 Other specified abnormal findings of blood chemistry: Secondary | ICD-10-CM

## 2020-05-04 LAB — IGA: IgA: 190 mg/dL (ref 68–378)

## 2020-05-04 LAB — IBC + FERRITIN
Ferritin: 266.7 ng/mL (ref 22.0–322.0)
Iron: 112 ug/dL (ref 42–165)
Saturation Ratios: 33.5 % (ref 20.0–50.0)
Transferrin: 239 mg/dL (ref 212.0–360.0)

## 2020-05-08 LAB — IGG, IGA, IGM
IgG (Immunoglobin G), Serum: 970 mg/dL (ref 600–1640)
IgM, Serum: 41 mg/dL — ABNORMAL LOW (ref 50–300)
Immunoglobulin A: 189 mg/dL (ref 47–310)

## 2020-05-08 LAB — TISSUE TRANSGLUTAMINASE, IGA: (tTG) Ab, IgA: <1 U/mL

## 2020-05-08 LAB — HEPATITIS C ANTIBODY
Hepatitis C Ab: NONREACTIVE
SIGNAL TO CUT-OFF: 0.01 (ref <1.00)

## 2020-05-08 LAB — ANTI-NUCLEAR AB-TITER (ANA TITER): ANA Titer 1: 1:40 {titer} — ABNORMAL HIGH

## 2020-05-08 LAB — ALPHA-1-ANTITRYPSIN: A-1 Antitrypsin, Ser: 132 mg/dL (ref 83–199)

## 2020-05-08 LAB — HEPATITIS B SURFACE ANTIBODY,QUALITATIVE: Hep B S Ab: NONREACTIVE

## 2020-05-08 LAB — ANA: Anti Nuclear Antibody (ANA): POSITIVE — AB

## 2020-05-08 LAB — CERULOPLASMIN: Ceruloplasmin: 29 mg/dL (ref 18–36)

## 2020-05-08 LAB — ANTI-SMOOTH MUSCLE ANTIBODY, IGG: Actin (Smooth Muscle) Antibody (IGG): <20 U (ref <20)

## 2020-05-08 LAB — MITOCHONDRIAL ANTIBODIES: Mitochondrial M2 Ab, IgG: <=20 U

## 2020-05-08 LAB — ANTI-MICROSOMAL ANTIBODY LIVER / KIDNEY: LKM1 Ab: <=20 U (ref <=20.0)

## 2020-05-18 ENCOUNTER — Other Ambulatory Visit: Payer: Self-pay | Admitting: *Deleted

## 2020-05-18 DIAGNOSIS — Z0000 Encounter for general adult medical examination without abnormal findings: Secondary | ICD-10-CM

## 2020-05-18 DIAGNOSIS — E1169 Type 2 diabetes mellitus with other specified complication: Secondary | ICD-10-CM

## 2020-05-18 DIAGNOSIS — R809 Proteinuria, unspecified: Secondary | ICD-10-CM

## 2020-05-18 DIAGNOSIS — R748 Abnormal levels of other serum enzymes: Secondary | ICD-10-CM

## 2020-05-18 DIAGNOSIS — E663 Overweight: Secondary | ICD-10-CM

## 2020-05-18 DIAGNOSIS — R7989 Other specified abnormal findings of blood chemistry: Secondary | ICD-10-CM

## 2020-05-19 ENCOUNTER — Telehealth: Payer: Self-pay | Admitting: Gastroenterology

## 2020-05-19 ENCOUNTER — Other Ambulatory Visit: Payer: Self-pay

## 2020-05-19 ENCOUNTER — Other Ambulatory Visit: Payer: Managed Care, Other (non HMO)

## 2020-05-19 NOTE — Telephone Encounter (Signed)
Pt is requesting a call back from a nurse regarding his results.

## 2020-05-20 LAB — CBC WITH DIFFERENTIAL/PLATELET
Absolute Monocytes: 276 cells/uL (ref 200–950)
Basophils Absolute: 21 cells/uL (ref 0–200)
Basophils Relative: 0.4 %
Eosinophils Absolute: 88 cells/uL (ref 15–500)
Eosinophils Relative: 1.7 %
HCT: 45.8 % (ref 38.5–50.0)
Hemoglobin: 15.5 g/dL (ref 13.2–17.1)
Lymphs Abs: 1539 cells/uL (ref 850–3900)
MCH: 31.3 pg (ref 27.0–33.0)
MCHC: 33.8 g/dL (ref 32.0–36.0)
MCV: 92.5 fL (ref 80.0–100.0)
MPV: 9.3 fL (ref 7.5–12.5)
Monocytes Relative: 5.3 %
Neutro Abs: 3276 cells/uL (ref 1500–7800)
Neutrophils Relative %: 63 %
Platelets: 232 10*3/uL (ref 140–400)
RBC: 4.95 10*6/uL (ref 4.20–5.80)
RDW: 12.4 % (ref 11.0–15.0)
Total Lymphocyte: 29.6 %
WBC: 5.2 10*3/uL (ref 3.8–10.8)

## 2020-05-20 LAB — HEMOGLOBIN A1C
Hgb A1c MFr Bld: 6.2 % of total Hgb — ABNORMAL HIGH (ref <5.7)
Mean Plasma Glucose: 131 (calc)
eAG (mmol/L): 7.3 (calc)

## 2020-05-20 LAB — LIPID PANEL
Cholesterol: 135 mg/dL (ref <200)
HDL: 48 mg/dL (ref >=40)
LDL Cholesterol (Calc): 68 mg/dL (calc)
Non-HDL Cholesterol (Calc): 87 mg/dL (calc) (ref <130)
Total CHOL/HDL Ratio: 2.8 (calc) (ref <5.0)
Triglycerides: 109 mg/dL (ref <150)

## 2020-05-20 LAB — COMPLETE METABOLIC PANEL WITH GFR
AG Ratio: 1.9 (calc) (ref 1.0–2.5)
ALT: 164 U/L — ABNORMAL HIGH (ref 9–46)
AST: 126 U/L — ABNORMAL HIGH (ref 10–40)
Albumin: 4.2 g/dL (ref 3.6–5.1)
Alkaline phosphatase (APISO): 50 U/L (ref 36–130)
BUN/Creatinine Ratio: 24 (calc) — ABNORMAL HIGH (ref 6–22)
BUN: 14 mg/dL (ref 7–25)
CO2: 25 mmol/L (ref 20–32)
Calcium: 9.1 mg/dL (ref 8.6–10.3)
Chloride: 103 mmol/L (ref 98–110)
Creat: 0.58 mg/dL — ABNORMAL LOW (ref 0.60–1.35)
GFR, Est African American: 143 mL/min/{1.73_m2} (ref >=60)
GFR, Est Non African American: 123 mL/min/{1.73_m2} (ref >=60)
Globulin: 2.2 g/dL (calc) (ref 1.9–3.7)
Glucose, Bld: 131 mg/dL — ABNORMAL HIGH (ref 65–99)
Potassium: 4.2 mmol/L (ref 3.5–5.3)
Sodium: 137 mmol/L (ref 135–146)
Total Bilirubin: 0.7 mg/dL (ref 0.2–1.2)
Total Protein: 6.4 g/dL (ref 6.1–8.1)

## 2020-05-20 LAB — TSH: TSH: 2.35 mIU/L (ref 0.40–4.50)

## 2020-05-26 ENCOUNTER — Other Ambulatory Visit: Payer: Self-pay

## 2020-05-26 ENCOUNTER — Ambulatory Visit (INDEPENDENT_AMBULATORY_CARE_PROVIDER_SITE_OTHER): Payer: Managed Care, Other (non HMO) | Admitting: Family Medicine

## 2020-05-26 ENCOUNTER — Encounter: Payer: Self-pay | Admitting: Family Medicine

## 2020-05-26 VITALS — BP 107/61 | HR 72 | Temp 97.1°F | Resp 16 | Ht 70.0 in | Wt 199.6 lb

## 2020-05-26 DIAGNOSIS — Z0000 Encounter for general adult medical examination without abnormal findings: Secondary | ICD-10-CM | POA: Diagnosis not present

## 2020-05-26 DIAGNOSIS — Z23 Encounter for immunization: Secondary | ICD-10-CM

## 2020-05-26 DIAGNOSIS — R748 Abnormal levels of other serum enzymes: Secondary | ICD-10-CM

## 2020-05-26 DIAGNOSIS — R809 Proteinuria, unspecified: Secondary | ICD-10-CM

## 2020-05-26 DIAGNOSIS — E1129 Type 2 diabetes mellitus with other diabetic kidney complication: Secondary | ICD-10-CM | POA: Diagnosis not present

## 2020-05-26 DIAGNOSIS — E785 Hyperlipidemia, unspecified: Secondary | ICD-10-CM

## 2020-05-26 DIAGNOSIS — E663 Overweight: Secondary | ICD-10-CM

## 2020-05-26 DIAGNOSIS — I2510 Atherosclerotic heart disease of native coronary artery without angina pectoris: Secondary | ICD-10-CM

## 2020-05-26 DIAGNOSIS — E1169 Type 2 diabetes mellitus with other specified complication: Secondary | ICD-10-CM

## 2020-05-26 MED ORDER — LISINOPRIL 2.5 MG PO TABS: 2.5000 mg | ORAL_TABLET | Freq: Every day | ORAL | 3 refills | Status: DC

## 2020-05-26 NOTE — Progress Notes (Signed)
Subjective:    Patient ID: Stephen [name], male    DOB: 03/10/1976, 44 y.o.   MRN: [id number]  Stephen [name] is a 44 y.o. male presenting on 05/26/2020 for Annual Exam   HPI   Here for Annual Physical and Lab Review.  CHRONIC DM, Type 2 / Overweight BMI >28 Prior A1c 6 range CBGs: Avg 105-120, Low >100, High <150. Checks CBGs daily Meds: None Currently on ACEi Lifestyle: - Weight down 6 lbs in 3-4 months - Diet off alcohol, improved diet - Exercise - unable to return to gym Last DM Eye 11/2019 - fam history diabetes Denies hypoglycemia, polyuria, visual changes, numbness or tingling.  HYPERLIPIDEMIA / Elevated LFTs / CAD PMH MI x 3 in 2018, followed by Dr [facility] - Reports concerns with hyperlipidemia / fatty liver history - Taken off Statin therapy and started on Repatha PCKS9 inhib in 02/2020 - Now lab shows dramatic improved Lipid panel, but still elevated LFTs - GI had evaluation in Sept 2021 unable to identify cause of Elevated LFTs, did show fatty liver on RUQ [place] 05/04/20 see below only mild, and had mild low IgM otherwise unremarkable, non immune to Hep B due for vaccine - Taking ASA 81, vascepa   Health Maintenance: Due Hep B vaccine dose#1, then repeat in 1 month  Due for Flu Shot, declines today despite counseling on benefits   Depression screen [facility] 2/9 05/26/2020 11/19/2019 05/18/2018  Decreased Interest 0 0 0  Down, Depressed, Hopeless 0 0 0  PHQ - 2 Score 0 0 0    Past Medical History:  Diagnosis Date  . CAD (coronary artery disease)    a. LHC 04/2017: D2 60%, pLCx 30%, OM2 85%, OM3 100%L-L collats, m-dRCA 100% L-R collats, LVEF 55-65%; b. LHC 05/15/2017: D2 60%, pLCx 30%, OM2-1 85% s/p PCI/DES, OM2-2 30%, OM3 100% L-L collats, m-dRCA 100% L-R collats    . Diabetes ([place])   . Elevated cholesterol   . History of echocardiogram    a. 04/2017: EF 55-60%, normal wall motion, normal LV diastolic function, no significant valvular abnormalities  .  Hyperlipidemia   . Kidney stones 2002, 2008   no stone eval  . Myocardial infarction [facility])    Past Surgical History:  Procedure Laterality Date  . APPENDECTOMY  2000  . CARDIAC CATHETERIZATION    . CORONARY STENT INTERVENTION N/A 05/15/2017   Procedure: CORONARY STENT INTERVENTION;  Surgeon: Wellington Hampshire, MD;  Location: [place] [facility];  Service: Cardiovascular;  Laterality: N/A;  . LEFT HEART CATH AND CORONARY ANGIOGRAPHY Left 04/24/2017   Procedure: LEFT HEART CATH AND CORONARY ANGIOGRAPHY;  Surgeon: Wellington Hampshire, MD;  Location: [place] [facility];  Service: Cardiovascular;  Laterality: Left;   Social History   Socioeconomic History  . Marital status: Divorced    Spouse name: Not on file  . Number of children: Not on file  . Years of education: Not on file  . Highest education level: Not on file  Occupational History  . Not on file  Tobacco Use  . Smoking status: Former Smoker    Packs/day: 0.10    Years: 3.00    Pack years: 0.30    Types: Cigarettes    Quit date: 08/15/1994    Years since quitting: 25.7  . Smokeless tobacco: Never Used  . Tobacco comment: weekend smoker, pt would only smoke bout 1 -2 cig on the weekend.   Vaping Use  . Vaping Use: Never used  Substance  and Sexual Activity  . Alcohol use: Yes    Comment: on week-ends  . Drug use: No  . Sexual activity: Yes    Comment: preventing w/ partner contraception  Other Topics Concern  . Not on file  Social History Narrative  . Not on file   Social Determinants of Health   Financial Resource Strain:   . Difficulty of Paying Living Expenses: Not on file  Food Insecurity:   . Worried About Charity fundraiser in the Last Year: Not on file  . Ran Out of Food in the Last Year: Not on file  Transportation Needs:   . Lack of Transportation (Medical): Not on file  . Lack of Transportation (Non-Medical): Not on file  Physical Activity:   . Days of Exercise per Week: Not on file  . Minutes of  Exercise per Session: Not on file  Stress:   . Feeling of Stress : Not on file  Social Connections:   . Frequency of Communication with Friends and Family: Not on file  . Frequency of Social Gatherings with Friends and Family: Not on file  . Attends Religious Services: Not on file  . Active Member of Clubs or Organizations: Not on file  . Attends Archivist Meetings: Not on file  . Marital Status: Not on file  Intimate Partner Violence:   . Fear of Current or Ex-Partner: Not on file  . Emotionally Abused: Not on file  . Physically Abused: Not on file  . Sexually Abused: Not on file   Family History  Problem Relation Age of Onset  . Heart disease Mother   . Thyroid disease Mother   . CAD Mother 74  . Hypertension Mother   . Hyperlipidemia Mother   . Chronic Renal Failure Mother   . Cancer Father        pancreatic s/p Whipple procedure  . Diabetes Father   . Cholecystitis Father   . Pancreatic cancer Father   . Hyperthyroidism Sister   . Lung cancer Paternal Aunt   . Diabetes Paternal Uncle   . Stroke Paternal Uncle   . Multiple sclerosis Sister   . Hyperthyroidism Sister   . Hyperthyroidism Sister   . Diabetes Sister   . Healthy Sister   . Healthy Son   . Healthy Son   . Pancreatic cancer Paternal Uncle   . Stomach cancer Paternal Aunt   . Vision loss Neg Hx   . Heart attack Neg Hx   . Breast cancer Neg Hx   . Colon cancer Neg Hx   . Prostate cancer Neg Hx   . Esophageal cancer Neg Hx    Current Outpatient Medications on File Prior to Visit  Medication Sig  . acetaminophen (TYLENOL) 500 MG tablet Take 500-1,000 mg by mouth every 6 (six) hours as needed (for pain/headaches.).  Marland Kitchen aspirin EC 81 MG tablet Take 1 tablet (81 mg total) by mouth daily. (Patient taking differently: Take 81 mg by mouth at bedtime. )  . CONTOUR NEXT TEST test strip USE 1 TEST STRIP TO CHECK GLUCOSE UP TO TWICE DAILY AS INSTRUCTED  . Evolocumab (REPATHA SURECLICK) 824 MG/ML SOAJ  Inject 1 pen into the skin every 14 (fourteen) days.  . Microlet Lancets MISC USE 1 LANCET TO CHECK BLOOD SUGAR TWICE DAILY AS DIRECTED  . nitroGLYCERIN (NITROSTAT) 0.4 MG SL tablet Place 1 tablet (0.4 mg total) under the tongue every 5 (five) minutes as needed for chest pain.  Marland Kitchen VASCEPA 1  g capsule Take 2 capsules by mouth twice daily   No current facility-administered medications on file prior to visit.    Review of Systems  Constitutional: Negative for activity change, appetite change, chills, diaphoresis, fatigue and fever.  HENT: Negative for congestion and hearing loss.   Eyes: Negative for visual disturbance.  Respiratory: Negative for cough, chest tightness, shortness of breath and wheezing.   Cardiovascular: Negative for chest pain, palpitations and leg swelling.  Gastrointestinal: Negative for abdominal pain, constipation, diarrhea, nausea and vomiting.  Endocrine: Negative for cold intolerance.  Genitourinary: Negative for difficulty urinating, dysuria, frequency and hematuria.  Musculoskeletal: Positive for arthralgias. Negative for neck pain.  Skin: Negative for rash.  Allergic/Immunologic: Negative for environmental allergies.  Neurological: Negative for dizziness, weakness, light-headedness, numbness and headaches.  Hematological: Negative for adenopathy.  Psychiatric/Behavioral: Negative for behavioral problems, dysphoric mood and sleep disturbance.   Per HPI unless specifically indicated above     Objective:    BP 107/61   Pulse 72   Temp (!) 97.1 F (36.2 C) (Temporal)   Resp 16   Ht 5\' 10"  (1.778 m)   Wt 199 lb 9.6 oz (90.5 kg)   SpO2 100%   BMI 28.64 kg/m   Wt Readings from Last 3 Encounters:  05/26/20 199 lb 9.6 oz (90.5 kg)  04/21/20 199 lb 2 oz (90.3 kg)  02/25/20 205 lb (93 kg)    Physical Exam Vitals and nursing note reviewed.  Constitutional:      General: He is not in acute distress.    Appearance: He is well-developed. He is not diaphoretic.      Comments: Well-appearing, comfortable, cooperative  HENT:     Head: Normocephalic and atraumatic.  Eyes:     General:        Right eye: No discharge.        Left eye: No discharge.     Conjunctiva/sclera: Conjunctivae normal.     Pupils: Pupils are equal, round, and reactive to light.  Neck:     Thyroid: No thyromegaly.  Cardiovascular:     Rate and Rhythm: Normal rate and regular rhythm.     Heart sounds: Normal heart sounds. No murmur heard.   Pulmonary:     Effort: Pulmonary effort is normal. No respiratory distress.     Breath sounds: Normal breath sounds. No wheezing or rales.  Abdominal:     General: Bowel sounds are normal. There is no distension.     Palpations: Abdomen is soft. There is no mass.     Tenderness: There is no abdominal tenderness.  Musculoskeletal:        General: No tenderness. Normal range of motion.     Cervical back: Normal range of motion and neck supple. No tenderness.     Comments: Upper / Lower Extremities: - Normal muscle tone, strength bilateral upper extremities 5/5, lower extremities 5/5  Mild crepitus Right knee. Full ROM.  Lymphadenopathy:     Cervical: No cervical adenopathy.  Skin:    General: Skin is warm and dry.     Findings: No erythema or rash.  Neurological:     Mental Status: He is alert and oriented to person, place, and time.     Comments: Distal sensation intact to light touch all extremities  Psychiatric:        Behavior: Behavior normal.     Comments: Well groomed, good eye contact, normal speech and thoughts       Results for orders placed or  performed in visit on 05/18/20  TSH  Result Value Ref Range   TSH 2.35 0.40 - 4.50 mIU/L  Lipid panel  Result Value Ref Range   Cholesterol 135 <200 mg/dL   HDL 48 > OR = 40 mg/dL   Triglycerides 109 <150 mg/dL   LDL Cholesterol (Calc) 68 mg/dL (calc)   Total CHOL/HDL Ratio 2.8 <5.0 (calc)   Non-HDL Cholesterol (Calc) 87 <130 mg/dL (calc)  COMPLETE METABOLIC PANEL  WITH GFR  Result Value Ref Range   Glucose, Bld 131 (H) 65 - 99 mg/dL   BUN 14 7 - 25 mg/dL   Creat 0.58 (L) 0.60 - 1.35 mg/dL   GFR, Est Non African American 123 > OR = 60 mL/min/1.98m2   GFR, Est African American 143 > OR = 60 mL/min/1.52m2   BUN/Creatinine Ratio 24 (H) 6 - 22 (calc)   Sodium 137 135 - 146 mmol/L   Potassium 4.2 3.5 - 5.3 mmol/L   Chloride 103 98 - 110 mmol/L   CO2 25 20 - 32 mmol/L   Calcium 9.1 8.6 - 10.3 mg/dL   Total Protein 6.4 6.1 - 8.1 g/dL   Albumin 4.2 3.6 - 5.1 g/dL   Globulin 2.2 1.9 - 3.7 g/dL (calc)   AG Ratio 1.9 1.0 - 2.5 (calc)   Total Bilirubin 0.7 0.2 - 1.2 mg/dL   Alkaline phosphatase (APISO) 50 36 - 130 U/L   AST 126 (H) 10 - 40 U/L   ALT 164 (H) 9 - 46 U/L  CBC with Differential/Platelet  Result Value Ref Range   WBC 5.2 3.8 - 10.8 Thousand/uL   RBC 4.95 4.20 - 5.80 Million/uL   Hemoglobin 15.5 13.2 - 17.1 g/dL   HCT 45.8 38 - 50 %   MCV 92.5 80.0 - 100.0 fL   MCH 31.3 27.0 - 33.0 pg   MCHC 33.8 32.0 - 36.0 g/dL   RDW 12.4 11.0 - 15.0 %   Platelets 232 140 - 400 Thousand/uL   MPV 9.3 7.5 - 12.5 fL   Neutro Abs 3,276 1,500 - 7,800 cells/uL   Lymphs Abs 1,539 850 - 3,900 cells/uL   Absolute Monocytes 276 200 - 950 cells/uL   Eosinophils Absolute 88 15 - 500 cells/uL   Basophils Absolute 21 0 - 200 cells/uL   Neutrophils Relative % 63 %   Total Lymphocyte 29.6 %   Monocytes Relative 5.3 %   Eosinophils Relative 1.7 %   Basophils Relative 0.4 %  Hemoglobin A1c  Result Value Ref Range   Hgb A1c MFr Bld 6.2 (H) <5.7 % of total Hgb   Mean Plasma Glucose 131 (calc)   eAG (mmol/L) 7.3 (calc)    I have personally reviewed the radiology report from Abdominal Liver US RUQ on 05/04/20.  CLINICAL DATA:  Abnormal LFTs.  EXAM: ULTRASOUND ABDOMEN LIMITED RIGHT UPPER QUADRANT  COMPARISON:  None.  FINDINGS: Gallbladder:  No gallstones or wall thickening visualized. No sonographic Murphy sign noted by sonographer.  Common bile  duct:  Diameter: 0.4 cm, within normal  Liver:  No focal lesion identified. Parenchymal echogenicity may be slightly increased. Portal vein is patent on color Doppler imaging with normal direction of blood flow towards the liver.  Other: None.  IMPRESSION: Possible mild increase in liver parenchymal echogenicity as can be seen in early fatty infiltration. No focal lesion identified.   Electronically Signed   By: Audie Pinto M.D.   On: 05/04/2020 10:16     Assessment & Plan:  Problem List Items Addressed This Visit    Type 2 diabetes mellitus with microalbuminuria, without long-term current use of insulin ([place])    Well-controlled DM with A1c 6.2, slight increased Not on medication Complications - hyperlipidemia, CAD - increases risk of future cardiovascular complications  Plan:  1. Continue current diet controlled / lifestyle regimen 2. Encourage improved lifestyle - low carb, low sugar diet, reduce portion size, continue improving regular exercise 3. Check CBG, bring log to next visit for review 4. Continue ASA, ACEi, Repatha PCSK9 5. Updated already on DM Foot Eye  Future consider GLP1 agent for cardioprotective benefits      Relevant Medications   lisinopril (ZESTRIL) 2.5 MG tablet   Overweight (BMI 25.0-29.9)    Improving weight now Encourage continue improve lifestyle diet / exercise regimen resume      Hyperlipidemia associated with type 2 diabetes mellitus ([place])    Dramatic improved control on Repatha PCSK9 inhibitor Last lipid 05/2020, now 3 months on repatha improved CAD s/p MI, DM2 Off statins due to intolerance (atorvastatin, rosuvastatin), also prior on lovaza, zetia  Likely fatty liver is cause of elevated LFTs, persisting  Plan: 1. Continue current meds - Repatha PCSK9 inhibitor injection per Cards, Vascepa 2g BID 2. Continue ASA 81mg  for secondary ASCVD risk reduction 3. Encourage improved lifestyle - low carb/cholesterol, reduce  portion size, continue improving regular exercise      Relevant Medications   lisinopril (ZESTRIL) 2.5 MG tablet   Elevated liver enzymes    Persistent, without dramatic change now 3 months off statin and on repatha PCSK9 inhibitor Followed by GI Extensive lab work up unremarkable, RUQ Abd [place] fatty liver mild - Only mild low IgM unlikely related  Likely cause is fatty liver still  F/u with Cards for cholesterol and GI for follow-up We can f/u in 4 months and consider repeat lab and/or IgM      Coronary artery disease    Stable without angina Controlled s/p stent placement and med management per Cardiology On ASA, ACEi, lipid med PCKS9 inhib injection      Relevant Medications   lisinopril (ZESTRIL) 2.5 MG tablet    Other Visit Diagnoses    Annual physical exam    -  Primary   Need for hepatitis B vaccination       Relevant Orders   Heplisav-B (HepB-CPG) Vaccine (Completed)      Updated Health Maintenance information - Non immune to Hep B, due for first dose Heplisav B vaccine today, then repeat in 1 month Reviewed recent lab results with patient Encouraged improvement to lifestyle with diet and exercise - Goal of weight loss  #Low IgM, follow-up Uncertain cause of this lab, isolated finding mild low IgM at 41, nml is >50, no sign of immunodeficiency based on history, no recurrent infections or other concerning clinical features - Only association at this time is elevated LFT results mild 100-150 range - Will follow this for now, he can return to GI/Cards for further management and if persistent low IgM or other concerns within next 4 month we can refer to Immuno vs Rheum   Meds ordered this encounter  Medications  . lisinopril (ZESTRIL) 2.5 MG tablet    Sig: Take 1 tablet (2.5 mg total) by mouth daily.    Dispense:  90 tablet    Refill:  3     Follow up plan: Return in about 4 months (around 09/26/2020) for nurse 1 month Hep B vaccine#2 - 4-5 mnth F/u  Liver  Enzymes, Low IgM (lab AFTER) - Diabetes f/u.  Nobie Putnam, [facility] 05/26/2020, 8:27 AM

## 2020-05-26 NOTE — Assessment & Plan Note (Signed)
Persistent, without dramatic change now 3 months off statin and on repatha PCSK9 inhibitor Followed by GI Extensive lab work up unremarkable, RUQ Abd [place] fatty liver mild - Only mild low IgM unlikely related  Likely cause is fatty liver still  F/u with Cards for cholesterol and GI for follow-up We can f/u in 4 months and consider repeat lab and/or IgM

## 2020-05-26 NOTE — Assessment & Plan Note (Addendum)
Dramatic improved control on Repatha PCSK9 inhibitor Last lipid 05/2020, now 3 months on repatha improved CAD s/p MI, DM2 Off statins due to intolerance (atorvastatin, rosuvastatin), also prior on lovaza, zetia  Likely fatty liver is cause of elevated LFTs, persisting  Plan: 1. Continue current meds - Repatha PCSK9 inhibitor injection per Cards, Vascepa 2g BID 2. Continue ASA 81mg  for secondary ASCVD risk reduction 3. Encourage improved lifestyle - low carb/cholesterol, reduce portion size, continue improving regular exercise

## 2020-05-26 NOTE — Assessment & Plan Note (Signed)
Well-controlled DM with A1c 6.2, slight increased Not on medication Complications - hyperlipidemia, CAD - increases risk of future cardiovascular complications  Plan:  1. Continue current diet controlled / lifestyle regimen 2. Encourage improved lifestyle - low carb, low sugar diet, reduce portion size, continue improving regular exercise 3. Check CBG, bring log to next visit for review 4. Continue ASA, ACEi, Repatha PCSK9 5. Updated already on DM Foot Eye  Future consider GLP1 agent for cardioprotective benefits

## 2020-05-26 NOTE — Patient Instructions (Addendum)
Thank you for coming to the office today.  For right now likely fatty liver is likely cause of abnormal liver enzymes Keep on Repatha Keep improving goal for lifestyle diet and exercise regimen.  Likely R knee cartilage wear and tear Try knee sleeve for compression May use Topical Voltaren gel OTC if bothersome or pain or swelling Avoid high impact activity  See the other specialists, if still issue with IgM we can repeat in 4-5 months  1st dose Hep B vaccine today, repeat in 1 month nurse visit    Please schedule a Follow-up Appointment to: Return in about 4 months (around 09/26/2020) for nurse 1 month Hep B vaccine#2 - 4-5 mnth F/u Liver Enzymes, Low IgM (lab AFTER) - Diabetes f/u.  If you have any other questions or concerns, please feel free to call the office or send a message through [place]. You may also schedule an earlier appointment if necessary.  Additionally, you may be receiving a survey about your experience at our office within a few days to 1 week by e-mail or mail. We value your feedback.  Nobie Putnam, DO [place]

## 2020-05-26 NOTE — Assessment & Plan Note (Signed)
Stable without angina Controlled s/p stent placement and med management per Cardiology On ASA, ACEi, lipid med PCKS9 inhib injection

## 2020-05-26 NOTE — Assessment & Plan Note (Signed)
Improving weight now Encourage continue improve lifestyle diet / exercise regimen resume

## 2020-06-24 ENCOUNTER — Other Ambulatory Visit: Payer: Self-pay

## 2020-06-24 ENCOUNTER — Ambulatory Visit (INDEPENDENT_AMBULATORY_CARE_PROVIDER_SITE_OTHER): Payer: Managed Care, Other (non HMO)

## 2020-06-24 DIAGNOSIS — Z23 Encounter for immunization: Secondary | ICD-10-CM

## 2020-06-25 DIAGNOSIS — Z23 Encounter for immunization: Secondary | ICD-10-CM

## 2020-06-25 NOTE — Progress Notes (Signed)
Nurse visit for Hep B vaccine.

## 2020-06-29 ENCOUNTER — Ambulatory Visit: Payer: Managed Care, Other (non HMO)

## 2020-07-20 ENCOUNTER — Other Ambulatory Visit: Payer: Self-pay | Admitting: Family Medicine

## 2020-07-20 DIAGNOSIS — E782 Mixed hyperlipidemia: Secondary | ICD-10-CM

## 2020-08-12 ENCOUNTER — Telehealth: Payer: Self-pay

## 2020-08-12 NOTE — Telephone Encounter (Signed)
Prior Authorization for Repatha Sureclick 140mg /mL initiated through [email or web address] at the request of [facility] for continuation of therapy. KEY  RESPONSE: [facility] is reviewing your PA request and will respond within 24 hours for Medicaid or up to 72 hours for non-Medicaid plans, based on the required timeframe determined by state or federal regulations.

## 2020-09-01 ENCOUNTER — Ambulatory Visit: Payer: Managed Care, Other (non HMO) | Admitting: Cardiovascular Disease

## 2020-09-02 ENCOUNTER — Other Ambulatory Visit: Payer: Self-pay | Admitting: Cardiovascular Disease

## 2020-09-02 DIAGNOSIS — E1169 Type 2 diabetes mellitus with other specified complication: Secondary | ICD-10-CM

## 2020-09-02 DIAGNOSIS — E785 Hyperlipidemia, unspecified: Secondary | ICD-10-CM

## 2020-09-02 DIAGNOSIS — Z789 Other specified health status: Secondary | ICD-10-CM

## 2020-09-02 NOTE — Telephone Encounter (Signed)
Rx request sent to pharmacy.  

## 2020-09-03 ENCOUNTER — Encounter: Payer: Self-pay | Admitting: Physician Assistant

## 2020-09-03 NOTE — Progress Notes (Unsigned)
Cardiology Office Note    Date:  09/10/2020   ID:  Stephen [name], DOB 09/19/1975, MRN [id number]  PCP:  Olin Hauser, DO  Cardiologist:  Kathlyn Sacramento, MD  Electrophysiologist:  None   Chief Complaint: Follow up  History of Present Illness:   Stephen [name] is a 45 y.o. male with history of CAD, DM2, HLD, and abnormal liver function who presents for follow up of his CAD.   He underwent nuclear stress testing for unstable angina in 2018 that was abnormal. Echo at that time showed an normal EF. Subsequent LHC in 04/2017 showed significant underlying 2-vessel CAD with subacute thrombotic occlusion of the mid to distal RCA with left-to-right collaterals, occluded OM3 with left-to-left collaterals, significant stenosis of a large OM2, and moderate stenosis of the D2. The LAD was mildly and diffusely diseased. EF was normal. LVEDP was high-normal. He was initially managed medically, but had worsening angina and subsequently underwent PCI/DES to the OM2 in 04/2017. The RCA was left to be managed medically given well developed collaterals. He was last seen in the office in 02/2020 and was without angina. It was noted he had elevated LFTs on both Lipitor and Crestor and had been managed on Zetia and Vascpea. Follow up LFTs off statins continued to show elevated LFTs and in this setting, Zetia was stopped, he was referred to GI, and he was started on Repatha. Of note, he triglycerides remained elevated, though he was not a significant drinker.   He comes in doing well from a cardiac perspective.  Since he was last seen he denies any chest pain, dyspnea, palpitations, dizziness, presyncope, syncope, or lower extremity swelling.  He does have issues taking his Repatha on time at times secondary to his frequent job travel to [place].  He is working with a Management consultant out there to see if he can get refills of his Repatha while he is away from home on work.  He also notes a several month history  dating back to October or November 2021 of diffuse muscle discomfort that he describes as similar to stretching a muscle.  He indicates the symptoms began after receiving his second hepatitis B vaccine followed by the Covid booster.  More recently over the past couple of weeks he has noted some bilateral upper extremity weakness as well.  Otherwise, he is without complaints.   Labs independently reviewed: 05/2020 - A1c 6.2, HGB 15.5, PLT 232, BUN 14, SCr 0.58, potassium 4.2, albumin 4.2, AST 126, ALT 164, TC 135, TG 109, HDL 48, LDL 68, TSH normal  Past Medical History:  Diagnosis Date  . CAD (coronary artery disease)    a. LHC 04/2017: D2 60%, pLCx 30%, OM2 85%, OM3 100%L-L collats, m-dRCA 100% L-R collats, LVEF 55-65%; b. LHC 05/15/2017: D2 60%, pLCx 30%, OM2-1 85% s/p PCI/DES, OM2-2 30%, OM3 100% L-L collats, m-dRCA 100% L-R collats    . Diabetes ([place])   . History of echocardiogram    a. 04/2017: EF 55-60%, normal wall motion, normal LV diastolic function, no significant valvular abnormalities  . Hyperlipidemia   . Kidney stones 2002, 2008   no stone eval  . Myocardial infarction [facility])     Past Surgical History:  Procedure Laterality Date  . APPENDECTOMY  2000  . CARDIAC CATHETERIZATION    . CORONARY STENT INTERVENTION N/A 05/15/2017   Procedure: CORONARY STENT INTERVENTION;  Surgeon: Wellington Hampshire, MD;  Location: [place] [facility];  Service: Cardiovascular;  Laterality: N/A;  .  LEFT HEART CATH AND CORONARY ANGIOGRAPHY Left 04/24/2017   Procedure: LEFT HEART CATH AND CORONARY ANGIOGRAPHY;  Surgeon: Wellington Hampshire, MD;  Location: [place] [facility];  Service: Cardiovascular;  Laterality: Left;    Current Medications: Current Meds  Medication Sig  . acetaminophen (TYLENOL) 500 MG tablet Take 500-1,000 mg by mouth every 6 (six) hours as needed (for pain/headaches.).  Marland Kitchen aspirin EC 81 MG tablet Take 1 tablet (81 mg total) by mouth daily.  . CONTOUR NEXT TEST test strip USE 1  TEST STRIP TO CHECK GLUCOSE UP TO TWICE DAILY AS INSTRUCTED  . lisinopril (ZESTRIL) 2.5 MG tablet Take 1 tablet (2.5 mg total) by mouth daily.  . Microlet Lancets MISC USE 1 LANCET TO CHECK BLOOD SUGAR TWICE DAILY AS DIRECTED  . nitroGLYCERIN (NITROSTAT) 0.4 MG SL tablet Place 1 tablet (0.4 mg total) under the tongue every 5 (five) minutes as needed for chest pain.  Marland Kitchen REPATHA SURECLICK 818 MG/ML SOAJ INJECT 1 PRE-FILLED PEN SUBCUTANEOUSLY EVERY TWO WEEKS  . VASCEPA 1 g capsule Take 2 capsules by mouth twice daily    Allergies:   Patient has no known allergies.   Social History   Socioeconomic History  . Marital status: Divorced    Spouse name: Not on file  . Number of children: Not on file  . Years of education: Not on file  . Highest education level: Not on file  Occupational History  . Not on file  Tobacco Use  . Smoking status: Former Smoker    Packs/day: 0.10    Years: 3.00    Pack years: 0.30    Types: Cigarettes    Quit date: 08/15/1994    Years since quitting: 26.0  . Smokeless tobacco: Never Used  . Tobacco comment: weekend smoker, pt would only smoke bout 1 -2 cig on the weekend.   Vaping Use  . Vaping Use: Never used  Substance and Sexual Activity  . Alcohol use: Yes    Comment: on week-ends  . Drug use: No  . Sexual activity: Yes    Comment: preventing w/ partner contraception  Other Topics Concern  . Not on file  Social History Narrative  . Not on file   Social Determinants of Health   Financial Resource Strain: Not on file  Food Insecurity: Not on file  Transportation Needs: Not on file  Physical Activity: Not on file  Stress: Not on file  Social Connections: Not on file     Family History:  [name] family history includes CAD (age of onset: 10) in his mother; Cancer in his father; Cholecystitis in his father; Chronic Renal Failure in his mother; Diabetes in his father, paternal uncle, and sister; Healthy in his sister, son, and son; Heart  disease in his mother; Hyperlipidemia in his mother; Hypertension in his mother; Hyperthyroidism in his sister, sister, and sister; Lung cancer in his paternal aunt; Multiple sclerosis in his sister; Pancreatic cancer in his father and paternal uncle; Stomach cancer in his paternal aunt; Stroke in his paternal uncle; Thyroid disease in his mother. There is no history of Vision loss, Heart attack, Breast cancer, Colon cancer, Prostate cancer, or Esophageal cancer.  ROS:   Review of Systems  Constitutional: Positive for malaise/fatigue. Negative for chills, diaphoresis, fever and weight loss.  HENT: Negative for congestion.   Eyes: Negative for discharge and redness.  Respiratory: Negative for cough, sputum production, shortness of breath and wheezing.   Cardiovascular: Negative for chest pain, palpitations, orthopnea,  claudication, leg swelling and PND.  Gastrointestinal: Negative for abdominal pain, heartburn, nausea and vomiting.  Musculoskeletal: Positive for myalgias. Negative for falls.       Bilateral upper extremity weakness  Skin: Negative for rash.  Neurological: Positive for weakness. Negative for dizziness, tingling, tremors, sensory change, speech change, focal weakness and loss of consciousness.  Endo/Heme/Allergies: Does not bruise/bleed easily.  Psychiatric/Behavioral: Negative for substance abuse. The patient is not nervous/anxious.   All other systems reviewed and are negative.    EKGs/Labs/Other Studies Reviewed:    Studies reviewed were summarized above. The additional studies were reviewed today:  2D echo 04/2017: - Left ventricle: The cavity size was normal. Wall thickness was  normal. Systolic function was normal. The estimated ejection  fraction was in the range of 55% to 60%. Wall motion was normal;  there were no regional wall motion abnormalities. Left  ventricular diastolic function parameters were normal.   Impressions:   - Normal study.   __________  LHC 04/2017:  The left ventricular systolic function is normal.  LV end diastolic pressure is mildly elevated.  The left ventricular ejection fraction is 55-65% by visual estimate.  Mid RCA to Dist RCA lesion, 100 %stenosed.  Ost 3rd Mrg to 3rd Mrg lesion, 100 %stenosed.  2nd Mrg lesion, 85 %stenosed.  Ost 2nd Diag to 2nd Diag lesion, 60 %stenosed.  Prox Cx lesion, 30 %stenosed.   1. Significant underlying three-vessel coronary artery disease. The culprit for abnormal stress test as subacute thrombotic occlusion of the mid to distal right coronary artery which now has left-to-right collaterals. OM 3 is also occluded with left to left collaterals. There is significant stenosis in OM 2 and moderate disease in second diagonal. The LAD is mildly and diffusely diseased. 2. Normal LV systolic function. High normal left ventricular end-diastolic pressure.  Recommendations: Recommend initial medical therapy. PCI of OM 2 can be considered for refractory angina. [name] LAD does not have significant obstructive disease to warrant CABG. __________  LHC 05/2017:  Mid RCA to Dist RCA lesion, 100 %stenosed.  Ost 3rd Mrg to 3rd Mrg lesion, 100 %stenosed.  Ost 2nd Diag to 2nd Diag lesion, 60 %stenosed.  Prox Cx lesion, 30 %stenosed.  2nd Mrg-2 lesion, 30 %stenosed.  2nd Mrg-1 lesion, 85 %stenosed.  Post intervention, there is a 0% residual stenosis.  A stent was successfully placed.   Successful angioplasty and drug-eluting stent placement to large OM 2.  Recommendations: Dual antiplatelet therapy for at least 6 months. Aggressive treatment of risk factors. I added small dose Toprol.   EKG:  EKG is ordered today.  The EKG ordered today demonstrates NSR, 73 bpm, no acute ST-T changes  Recent Labs: 05/19/2020: ALT 164; BUN 14; Creat 0.58; Hemoglobin 15.5; Platelets 232; Potassium 4.2; Sodium 137; TSH 2.35  Recent Lipid Panel    Component Value Date/Time    CHOL 135 05/19/2020 0805   CHOL 231 (H) 02/25/2020 1545   TRIG 109 05/19/2020 0805   HDL 48 05/19/2020 0805   HDL 50 02/25/2020 1545   CHOLHDL 2.8 05/19/2020 0805   VLDL 18 10/28/2019 0800   LDLCALC 68 05/19/2020 0805    PHYSICAL EXAM:    VS:  BP 127/78 (BP Location: Left Arm, Patient Position: Sitting, Cuff Size: Normal)   Pulse 73   Ht 5\' 10"  (1.778 m)   Wt 204 lb (92.5 kg)   SpO2 97%   BMI 29.27 kg/m   BMI: Body mass index is 29.27 kg/m.  Physical  Exam Vitals reviewed.  Constitutional:      Appearance: He is well-developed and well-nourished.  HENT:     Head: Normocephalic and atraumatic.  Eyes:     General:        Right eye: No discharge.        Left eye: No discharge.  Neck:     Vascular: No JVD.  Cardiovascular:     Rate and Rhythm: Normal rate and regular rhythm.     Pulses: No midsystolic click and no opening snap.          Posterior tibial pulses are 2+ on the right side and 2+ on the left side.     Heart sounds: Normal heart sounds, S1 normal and S2 normal. Heart sounds not distant. No murmur heard. No friction rub.  Pulmonary:     Effort: Pulmonary effort is normal. No respiratory distress.     Breath sounds: Normal breath sounds. No decreased breath sounds, wheezing or rales.  Chest:     Chest wall: No tenderness.  Abdominal:     General: There is no distension.     Palpations: Abdomen is soft.     Tenderness: There is no abdominal tenderness.  Musculoskeletal:        General: No edema.     Cervical back: Normal range of motion.     Comments: 4 out of 5 bilateral upper extremity strength.   5 out of 5 lower extremity strength bilaterally.  Skin:    General: Skin is warm and dry.     Nails: There is no clubbing or cyanosis.  Neurological:     Mental Status: He is alert and oriented to person, place, and time.  Psychiatric:        Mood and Affect: Mood and affect normal.        Speech: Speech normal.        Behavior: Behavior normal.         Thought Content: Thought content normal.        Judgment: Judgment normal.     Wt Readings from Last 3 Encounters:  09/10/20 204 lb (92.5 kg)  05/26/20 199 lb 9.6 oz (90.5 kg)  04/21/20 199 lb 2 oz (90.3 kg)     ASSESSMENT & PLAN:   1. CAD involving the native coronary arteries without angina: He is doing well without any symptoms concerning for angina.  Continue current medical therapy including aspirin, Repatha, Vascepa, lisinopril, and as needed SL NTG.  No indication for further ischemic testing at this time.  2. HLD/hypertriglyceridemia: LDL 68 from 05/2020 with mildly elevated AST/ALT with known abnormal LFT.  In this setting he is no longer on statin therapy or Zetia and is now currently taking Repatha.    3. DM2: A1c 6.2 from 05/2020.  4. Myalgias with bilateral upper extremity weakness: Check CMP, sed rate, CRP, TSH, CBC, and CK.  With regards to his bilateral upper extremity generalized weakness he may benefit from cervical spine imaging.  Will route note to his PCP for further follow-up.  5. Abnormal liver function: Follow-up with PCP and GI as directed.  Disposition: F/u with Dr. Fletcher Anon or an APP in 6 months.   Medication Adjustments/Labs and Tests Ordered: Current medicines are reviewed at length with the patient today.  Concerns regarding medicines are outlined above. Medication changes, Labs and Tests ordered today are summarized above and listed in the Patient Instructions accessible in Encounters.   SignedChristell Faith, PA-C 09/10/2020 4:31 PM  [address] [place] [address], [place] [address] [phone]

## 2020-09-10 ENCOUNTER — Other Ambulatory Visit: Payer: Self-pay

## 2020-09-10 ENCOUNTER — Ambulatory Visit (INDEPENDENT_AMBULATORY_CARE_PROVIDER_SITE_OTHER): Payer: Managed Care, Other (non HMO) | Admitting: Physician Assistant

## 2020-09-10 ENCOUNTER — Encounter: Payer: Self-pay | Admitting: Physician Assistant

## 2020-09-10 VITALS — BP 127/78 | HR 73 | Ht 70.0 in | Wt 204.0 lb

## 2020-09-10 DIAGNOSIS — I2510 Atherosclerotic heart disease of native coronary artery without angina pectoris: Secondary | ICD-10-CM

## 2020-09-10 DIAGNOSIS — E781 Pure hyperglyceridemia: Secondary | ICD-10-CM

## 2020-09-10 DIAGNOSIS — E785 Hyperlipidemia, unspecified: Secondary | ICD-10-CM

## 2020-09-10 DIAGNOSIS — R748 Abnormal levels of other serum enzymes: Secondary | ICD-10-CM | POA: Diagnosis not present

## 2020-09-10 DIAGNOSIS — M791 Myalgia, unspecified site: Secondary | ICD-10-CM

## 2020-09-10 DIAGNOSIS — R29898 Other symptoms and signs involving the musculoskeletal system: Secondary | ICD-10-CM

## 2020-09-10 NOTE — Patient Instructions (Signed)
Medication Instructions:  No changes  *If you need a refill on your cardiac medications before your next appointment, please call your pharmacy*   Lab Work: CMET, Sed rate, CRP, TSH, CBC, CK  If you have labs (blood work) drawn today and your tests are completely normal, you will receive your results only by: Marland Kitchen MyChart Message (if you have MyChart) OR . A paper copy in the mail If you have any lab test that is abnormal or we need to change your treatment, we will call you to review the results.   Testing/Procedures: None   Follow-Up: At [facility], you and your health needs are our priority.  As part of our continuing mission to provide you with exceptional heart care, we have created designated Provider Care Teams.  These Care Teams include your primary Cardiologist (physician) and Advanced Practice Providers (APPs -  Physician Assistants and Nurse Practitioners) who all work together to provide you with the care you need, when you need it.  Your next appointment:   6 month(s)  The format for your next appointment:   In Person  Provider:   You may see Kathlyn Sacramento, MD or one of the following Advanced Practice Providers on your designated Care Team:    Murray Hodgkins, NP  Christell Faith, PA-C  Marrianne Mood, PA-C  Cadence [place], [place]  Laurann Montana, NP

## 2020-09-11 ENCOUNTER — Telehealth: Payer: Self-pay | Admitting: Family Medicine

## 2020-09-11 ENCOUNTER — Telehealth: Payer: Self-pay | Admitting: Cardiology

## 2020-09-11 ENCOUNTER — Telehealth: Payer: Self-pay | Admitting: *Deleted

## 2020-09-11 DIAGNOSIS — R748 Abnormal levels of other serum enzymes: Secondary | ICD-10-CM

## 2020-09-11 DIAGNOSIS — M609 Myositis, unspecified: Secondary | ICD-10-CM

## 2020-09-11 DIAGNOSIS — R700 Elevated erythrocyte sedimentation rate: Secondary | ICD-10-CM

## 2020-09-11 LAB — C-REACTIVE PROTEIN: CRP: 3 mg/L (ref 0–10)

## 2020-09-11 LAB — COMPREHENSIVE METABOLIC PANEL
ALT: 300 IU/L — ABNORMAL HIGH (ref 0–44)
AST: 257 IU/L — ABNORMAL HIGH (ref 0–40)
Albumin/Globulin Ratio: 1.7 (ref 1.2–2.2)
Albumin: 4.3 g/dL (ref 4.0–5.0)
Alkaline Phosphatase: 67 IU/L (ref 44–121)
BUN/Creatinine Ratio: 25 — ABNORMAL HIGH (ref 9–20)
BUN: 13 mg/dL (ref 6–24)
Bilirubin Total: 0.6 mg/dL (ref 0.0–1.2)
CO2: 22 mmol/L (ref 20–29)
Calcium: 9.3 mg/dL (ref 8.7–10.2)
Chloride: 101 mmol/L (ref 96–106)
Creatinine, Ser: 0.51 mg/dL — ABNORMAL LOW (ref 0.76–1.27)
GFR calc Af Amer: 150 mL/min/{1.73_m2} (ref >59)
GFR calc non Af Amer: 130 mL/min/{1.73_m2} (ref >59)
Globulin, Total: 2.5 g/dL (ref 1.5–4.5)
Glucose: 95 mg/dL (ref 65–99)
Potassium: 4.3 mmol/L (ref 3.5–5.2)
Sodium: 138 mmol/L (ref 134–144)
Total Protein: 6.8 g/dL (ref 6.0–8.5)

## 2020-09-11 LAB — CBC
Hematocrit: 46 % (ref 37.5–51.0)
Hemoglobin: 15.5 g/dL (ref 13.0–17.7)
MCH: 29.1 pg (ref 26.6–33.0)
MCHC: 33.7 g/dL (ref 31.5–35.7)
MCV: 87 fL (ref 79–97)
Platelets: 285 10*3/uL (ref 150–450)
RBC: 5.32 x10E6/uL (ref 4.14–5.80)
RDW: 12.3 % (ref 11.6–15.4)
WBC: 5.6 10*3/uL (ref 3.4–10.8)

## 2020-09-11 LAB — SEDIMENTATION RATE: Sed Rate: 17 mm/hr — ABNORMAL HIGH (ref 0–15)

## 2020-09-11 LAB — CK: Total CK: 17530 U/L (ref 49–439)

## 2020-09-11 LAB — TSH: TSH: 2.97 u[IU]/mL (ref 0.450–4.500)

## 2020-09-11 NOTE — Telephone Encounter (Signed)
Received call from Christell Faith PA Cardiology.  Discussed recent lab results abnormal. He requested Urgent Rheumatology referral.  Placed order, our office will work on scheduling today.  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 Now he has elevated ESR Sed Rate and worsening labs. Cardiology is requesting urgent Rheumatological referral for consultation. Also had lab Low IgM antibody.   Has the referral been discussed with the patient?: Yes   Designated contact for the referral if not the patient (name/phone number): patient on chart   Has the patient seen a specialist for this issue before?: Yes  If so, who (practice/provider)? [facility], Cardiology [facility])   Does the patient have a provider or location preference for the referral?: Yes - [facility]  Would the patient like to see previous specialist if applicable?  Nobie Putnam, [facility] 09/11/2020, 9:47 AM

## 2020-09-11 NOTE — Telephone Encounter (Signed)
Spoke with patient and reviewed results and recommendations with him. Advised that he will get a call from Dr. Parks Ranger as well to discuss referral to rheumatology. Patient states that he is going on a trip from 09/13/20 and returning on 09/28/20. Spoke with provider and they advised that he should be seen urgently and should not go out of town. Patient states this trip has been planned and paid for in advance. Expressed emotional support and that he should get a call from his primary provider office sometime today and he can discuss this further with them. He did want to know which locations would be in his network and recommended he call his insurance to see which ones are covered. He seemed a little stunned with this news but will wait to speak with his primary care provider for their recommendations and make a decision at that time. He verbalized understanding of our conversation with no further questions at this time.

## 2020-09-11 NOTE — Telephone Encounter (Signed)
Paged by Maryan Puls re: critical lab result at 3:50am. [place] [address]. Pt is not currently on statin. Will notify ordering provider.

## 2020-09-11 NOTE — Telephone Encounter (Signed)
Agree with RN documentation and recommendations with the patient to postpone traveling out of town until he has been evaluated by rheumatology.  Regarding in network versus out of network rheumatologist he has been advised to contact his insurance.  If he happens to be referred to an out of network rheumatologist he can notify our office and we can attempt to place a referral to a rheumatologist office that is in network.  Following evaluation by rheumatology he should follow-up with PCP.

## 2020-09-11 NOTE — Telephone Encounter (Signed)
Case initially discussed with primary cardiologist.  He will review chart and we will discuss further.

## 2020-09-11 NOTE — Telephone Encounter (Signed)
Please see detailed result note.

## 2020-09-11 NOTE — Telephone Encounter (Signed)
-----   Message from Rise Mu, PA-C sent at 09/11/2020  9:44 AM EST ----- Renal function normal Potassium at goal Random glucose Liver function slightly more elevated on most recent check when compared to prior readings Inflammation marker mildly elevated Thyroid function normal Blood count normal Marker for muscle damage/strain is significantly elevated  Case has been discussed with Dr. Fletcher Anon.  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  He is not currently on any medications which would lead to myositis.  He does have Tylenol on his medication list, please ensure he is not taking this given his elevated LFTs.  I have spoken directly with Dr. Parks Ranger and he will place an urgent rheumatology referral at the request of Dr. Fletcher Anon.

## 2020-09-11 NOTE — Telephone Encounter (Signed)
I called earlier to [facility] in [place] and spoke with someone who is expediting the referral.

## 2020-09-28 ENCOUNTER — Other Ambulatory Visit: Payer: Self-pay

## 2020-09-28 ENCOUNTER — Ambulatory Visit (INDEPENDENT_AMBULATORY_CARE_PROVIDER_SITE_OTHER): Payer: Managed Care, Other (non HMO) | Admitting: Family Medicine

## 2020-09-28 ENCOUNTER — Encounter: Payer: Self-pay | Admitting: Family Medicine

## 2020-09-28 VITALS — BP 137/77 | HR 85 | Ht 70.0 in | Wt 206.6 lb

## 2020-09-28 DIAGNOSIS — R748 Abnormal levels of other serum enzymes: Secondary | ICD-10-CM | POA: Diagnosis not present

## 2020-09-28 DIAGNOSIS — M609 Myositis, unspecified: Secondary | ICD-10-CM

## 2020-09-28 NOTE — Patient Instructions (Addendum)
Thank you for coming to the office today.  Keep in touch  Stay well hydrated to keep flushing kidneys  And pursue Rheumatology tomorrow. In future if need other consultation - if we cannot fully explain, then may consider Neurology for nerve signal causing some weakness as well.  If severe worse or change, please seek care immediately at hospital ED for further therapy.  Please schedule a Follow-up Appointment to: Return if symptoms worsen or fail to improve.  If you have any other questions or concerns, please feel free to call the office or send a message through [place]. You may also schedule an earlier appointment if necessary.  Additionally, you may be receiving a survey about your experience at our office within a few days to 1 week by e-mail or mail. We value your feedback.  Nobie Putnam, DO [place]

## 2020-09-28 NOTE — Progress Notes (Signed)
Subjective:    Patient ID: Stephen [name], male    DOB: May 06, 1976, 45 y.o.   MRN: [id number]  Marsel Gail is a 45 y.o. male presenting on 09/28/2020 for Coronary Artery Disease   HPI  Myositis / Muscle Weakness Elevated CK Total  09/10/20 - visit with Cardiology Christell Faith, had abnormal lab value with CK elevated 17k after work up done for myalgias, had additional blood work as well CPM, Sed rate, CRP, TSH, CBC. - He did have history of some lower extremity weakness / myalgia as well - He says overall symptoms have notably worsening in past 2 weeks, with more muscle weakness fatigue without significant pain but has soreness  Says, symptoms significantly started after 2nd dose of Hepatitis B vaccine, 06/24/20 - he felt very sore and stiffness, then by December 2021 he had significant symptoms.  He has reduced alcohol and now drinking mostly water.  He has long history of Elevated LFT >1 year. Has had GI work up. Now further elevated LFT   Depression screen [facility] 2/9 05/26/2020 11/19/2019 05/18/2018  Decreased Interest 0 0 0  Down, Depressed, Hopeless 0 0 0  PHQ - 2 Score 0 0 0    Social History   Tobacco Use  . Smoking status: Former Smoker    Packs/day: 0.10    Years: 3.00    Pack years: 0.30    Types: Cigarettes    Quit date: 08/15/1994    Years since quitting: 26.1  . Smokeless tobacco: Never Used  . Tobacco comment: weekend smoker, pt would only smoke bout 1 -2 cig on the weekend.   Vaping Use  . Vaping Use: Never used  Substance Use Topics  . Alcohol use: Yes    Comment: on week-ends  . Drug use: No    Review of Systems Per HPI unless specifically indicated above     Objective:    BP 137/77   Pulse 85   Ht '5\' 10"'  (1.778 m)   Wt 206 lb 9.6 oz (93.7 kg)   SpO2 99%   BMI 29.64 kg/m   Wt Readings from Last 3 Encounters:  09/28/20 206 lb 9.6 oz (93.7 kg)  09/10/20 204 lb (92.5 kg)  05/26/20 199 lb 9.6 oz (90.5 kg)    Physical Exam Vitals and nursing note  reviewed.  Constitutional:      General: He is not in acute distress.    Appearance: He is well-developed and well-nourished. He is not diaphoretic.     Comments: Well-appearing, comfortable, cooperative  HENT:     Head: Normocephalic and atraumatic.     Mouth/Throat:     Mouth: Oropharynx is clear and moist.  Eyes:     General:        Right eye: No discharge.        Left eye: No discharge.     Conjunctiva/sclera: Conjunctivae normal.  Cardiovascular:     Rate and Rhythm: Normal rate.  Pulmonary:     Effort: Pulmonary effort is normal.  Musculoskeletal:        General: No edema.  Skin:    General: Skin is warm and dry.     Findings: No erythema or rash.  Neurological:     Mental Status: He is alert and oriented to person, place, and time.  Psychiatric:        Mood and Affect: Mood and affect normal.        Behavior: Behavior normal.     Comments: Well groomed,  good eye contact, normal speech and thoughts    Results for orders placed or performed in visit on 09/10/20  Comp Met (CMET)  Result Value Ref Range   Glucose 95 65 - 99 mg/dL   BUN 13 6 - 24 mg/dL   Creatinine, Ser 0.51 (L) 0.76 - 1.27 mg/dL   GFR calc non Af Amer 130 >59 mL/min/1.73   GFR calc Af Amer 150 >59 mL/min/1.73   BUN/Creatinine Ratio 25 (H) 9 - 20   Sodium 138 134 - 144 mmol/L   Potassium 4.3 3.5 - 5.2 mmol/L   Chloride 101 96 - 106 mmol/L   CO2 22 20 - 29 mmol/L   Calcium 9.3 8.7 - 10.2 mg/dL   Total Protein 6.8 6.0 - 8.5 g/dL   Albumin 4.3 4.0 - 5.0 g/dL   Globulin, Total 2.5 1.5 - 4.5 g/dL   Albumin/Globulin Ratio 1.7 1.2 - 2.2   Bilirubin Total 0.6 0.0 - 1.2 mg/dL   Alkaline Phosphatase 67 44 - 121 IU/L   AST 257 (H) 0 - 40 IU/L   ALT 300 (H) 0 - 44 IU/L  Sedimentation rate  Result Value Ref Range   Sed Rate 17 (H) 0 - 15 mm/hr  C-reactive protein  Result Value Ref Range   CRP 3 0 - 10 mg/L  TSH  Result Value Ref Range   TSH 2.970 0.450 - 4.500 uIU/mL  CBC  Result Value Ref Range    WBC 5.6 3.4 - 10.8 x10E3/uL   RBC 5.32 4.14 - 5.80 x10E6/uL   Hemoglobin 15.5 13.0 - 17.7 g/dL   Hematocrit 46.0 37.5 - 51.0 %   MCV 87 79 - 97 fL   MCH 29.1 26.6 - 33.0 pg   MCHC 33.7 31.5 - 35.7 g/dL   RDW 12.3 11.6 - 15.4 %   Platelets 285 150 - 450 x10E3/uL  CK (Creatine Kinase)  Result Value Ref Range   Total CK 17,530 (HH) 49 - 439 U/L      Assessment & Plan:   Problem List Items Addressed This Visit    Elevated liver enzymes    Other Visit Diagnoses    Elevated CK    -  Primary   Myositis of multiple sites, unspecified myositis type          Clinically with elevated total CK last checked labs 09/09/20 at 17,500, clinical history suggestive of myositis with muscle soreness weakness upper and lower extremity Question if vaccination involved in current symptoms  Also assoc with worsening LFT Known chronic elevated LFT > 1 year, has had prior GI work up Normal/negative AMA, ASMA, A1-Antitrypsin, Hep C, ceruloplasmin, LKB, tTG, IgA, IgG, iron panel Abnormal IgM was low at the time in 04/2020 Additional work up in past with positive ANA. 04/2020  Last lab chemistry showed normal renal function Encouraged continued hydration Limit alcohol Future may warrant further consultation with Neurology if persistent weakness, may warrant work up to determine if neurological component, however at this time, appears most likely muscle etiology is cause.   No orders of the defined types were placed in this encounter.     Follow up plan: Return if symptoms worsen or fail to improve.   Nobie Putnam, [facility] 09/28/2020, 4:29 PM

## 2020-10-07 ENCOUNTER — Other Ambulatory Visit: Payer: Self-pay | Admitting: Rheumatology

## 2020-10-07 DIAGNOSIS — R748 Abnormal levels of other serum enzymes: Secondary | ICD-10-CM

## 2020-10-07 DIAGNOSIS — R0602 Shortness of breath: Secondary | ICD-10-CM

## 2020-10-07 DIAGNOSIS — R9389 Abnormal findings on diagnostic imaging of other specified body structures: Secondary | ICD-10-CM

## 2020-10-13 ENCOUNTER — Other Ambulatory Visit: Payer: Self-pay

## 2020-10-13 ENCOUNTER — Ambulatory Visit (LOCAL_COMMUNITY_HEALTH_CENTER): Payer: Managed Care, Other (non HMO)

## 2020-10-13 VITALS — Ht 70.0 in | Wt 200.0 lb

## 2020-10-13 DIAGNOSIS — R7612 Nonspecific reaction to cell mediated immunity measurement of gamma interferon antigen response without active tuberculosis: Secondary | ICD-10-CM | POA: Insufficient documentation

## 2020-10-13 NOTE — Progress Notes (Signed)
Call to patient with postive QFT from [facility] rheumatology. Patient is interested in LTBI information. Declines HIV testing at this time.   Deri Fuelling, RN

## 2020-10-15 NOTE — Progress Notes (Signed)
Patient is [facility] resident.  Will send lab results and CXR to [facility] via fax. TB RN will mail LTBI vs Active TB info to patient along with contact # for [facility].  TB RN notified [facility] Rheumatology re: change of county for [place]BI tx. Aileen Fass, RN

## 2020-10-15 NOTE — Addendum Note (Signed)
Addended by: Aileen Fass on: 10/15/2020 08:58 AM   Modules accepted: Level of Service

## 2020-10-20 ENCOUNTER — Ambulatory Visit
Admission: RE | Admit: 2020-10-20 | Discharge: 2020-10-20 | Disposition: A | Discharge status: Home / Self Care | Payer: Managed Care, Other (non HMO) | Source: Ambulatory Visit | Attending: Rheumatology | Admitting: Rheumatology

## 2020-10-20 ENCOUNTER — Other Ambulatory Visit: Payer: Self-pay

## 2020-10-20 DIAGNOSIS — R0602 Shortness of breath: Secondary | ICD-10-CM | POA: Diagnosis not present

## 2020-10-20 DIAGNOSIS — R748 Abnormal levels of other serum enzymes: Secondary | ICD-10-CM | POA: Insufficient documentation

## 2020-10-20 DIAGNOSIS — R9389 Abnormal findings on diagnostic imaging of other specified body structures: Secondary | ICD-10-CM | POA: Diagnosis present

## 2020-10-22 ENCOUNTER — Other Ambulatory Visit: Payer: Self-pay | Admitting: General Surgery

## 2020-10-27 ENCOUNTER — Ambulatory Visit (INDEPENDENT_AMBULATORY_CARE_PROVIDER_SITE_OTHER): Payer: Managed Care, Other (non HMO) | Admitting: Family Medicine

## 2020-10-27 ENCOUNTER — Encounter: Payer: Self-pay | Admitting: Family Medicine

## 2020-10-27 ENCOUNTER — Other Ambulatory Visit: Payer: Self-pay

## 2020-10-27 VITALS — BP 119/75 | HR 68 | Ht 70.0 in | Wt 199.6 lb

## 2020-10-27 DIAGNOSIS — E1169 Type 2 diabetes mellitus with other specified complication: Secondary | ICD-10-CM | POA: Diagnosis not present

## 2020-10-27 DIAGNOSIS — M609 Myositis, unspecified: Secondary | ICD-10-CM

## 2020-10-27 DIAGNOSIS — E1129 Type 2 diabetes mellitus with other diabetic kidney complication: Secondary | ICD-10-CM

## 2020-10-27 DIAGNOSIS — E785 Hyperlipidemia, unspecified: Secondary | ICD-10-CM

## 2020-10-27 DIAGNOSIS — R809 Proteinuria, unspecified: Secondary | ICD-10-CM

## 2020-10-27 NOTE — Progress Notes (Signed)
Subjective:    Patient ID: Stephen [name], male    DOB: 1976/07/02, 45 y.o.   MRN: [id number]  Stephen [name] is a 45 y.o. male presenting on 10/27/2020 for Diabetes and Elevated Hepatic Enzymes   HPI   CHRONIC DM, Type 2/ Overweight BMI >28 Prior A1c 6 range, last 05/2020 was 6.2 CBGs: Avg115-130 range on prednisone now, checks CBG daily Meds:None Currently on ACEi Lifestyle: - Diet off alcohol, improved diet - Exercise - unable to resume due to muscle problem Last DM Eye 11/2019 - fam history diabetes Denies hypoglycemia, polyuria, visual changes, numbness or tingling.  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 He will pursue [place] further management.   [facility] 05/26/2020 11/19/2019 05/18/2018  Decreased Interest 0 0 0  Down, Depressed, Hopeless 0 0 0  PHQ - 2 Score 0 0 0    Social History   Tobacco Use  . Smoking status: Former Smoker    Packs/day: 0.10    Years: 3.00    Pack years: 0.30    Types: Cigarettes     Quit date: 08/15/1994    Years since quitting: 26.2  . Smokeless tobacco: Never Used  . Tobacco comment: weekend smoker, pt would only smoke bout 1 -2 cig on the weekend.   Vaping Use  . Vaping Use: Never used  Substance Use Topics  . Alcohol use: Yes    Comment: on week-ends  . Drug use: No    Review of Systems Per HPI unless specifically indicated above     Objective:    BP 119/75   Pulse 68   Ht '5\' 10"'  (1.778 m)   Wt 199 lb 9.6 oz (90.5 kg)   SpO2 100%   BMI 28.64 kg/m   Wt Readings from Last 3 Encounters:  10/27/20 199 lb 9.6 oz (90.5 kg)  10/13/20 200 lb (90.7 kg)  09/28/20 206 lb 9.6 oz (93.7 kg)    Physical Exam Vitals and nursing note reviewed.  Constitutional:      General: He is not in acute distress.    Appearance: He is well-developed. He is not diaphoretic.     Comments: Well-appearing, comfortable, cooperative  HENT:     Head: Normocephalic and atraumatic.  Eyes:     General:        Right eye: No discharge.        Left eye: No discharge.     Conjunctiva/sclera: Conjunctivae normal.  Cardiovascular:     Rate and Rhythm: Normal rate.  Pulmonary:     Effort:  Pulmonary effort is normal.  Skin:    General: Skin is warm and dry.     Findings: No erythema or rash.  Neurological:     Mental Status: He is alert and oriented to person, place, and time.  Psychiatric:        Behavior: Behavior normal.     Comments: Well groomed, good eye contact, normal speech and thoughts        I have personally reviewed the radiology report from 10/20/20 CT.  CT CHEST WO CONTRAST [[id number]] Resulted: 10/21/20 1328  Order Status: Completed Updated: 10/21/20 1330  Narrative:   CLINICAL DATA: Dyspnea.   EXAM:  CT CHEST WITHOUT CONTRAST   TECHNIQUE:  Multidetector CT imaging of the chest was performed following the  standard protocol without IV contrast.   COMPARISON: Report of PA and lateral chest 09/29/2020 reviewed.  Images are not available.    FINDINGS:  Cardiovascular: No significant vascular findings. Normal heart size.  No pericardial effusion. Extensive calcific coronary atherosclerosis  is noted.   Mediastinum/Nodes: No enlarged mediastinal or axillary lymph nodes.  Thyroid gland, trachea, and esophagus demonstrate no significant  findings. Calcified mediastinal and hilar lymph nodes are consistent  with old granulomatous disease.   Lungs/Pleura: No pleural effusion. Small focus of linear atelectasis  or scar is seen in the left lung base. The lungs are otherwise  clear.   Upper Abdomen: Negative.   Musculoskeletal: No acute or focal abnormality.   IMPRESSION:  No finding to explain dyspnea. The lungs are clear.   Calcified mediastinal and hilar lymph nodes consistent with old  granulomatous disease.   Extensive calcific coronary atherosclerosis.    Electronically Signed  By: Inge Rise M.D.  On: 10/21/2020 13:28     10/06/20 EMG Impression: This is an abnormal electrodiagnostic study consistent with a generalized severe, proximal more than distal myopathic changes with involvement of paraspinal muscles, involving upper and lower extremities.     Results for orders placed or performed in visit on 09/10/20  Comp Met (CMET)  Result Value Ref Range   Glucose 95 65 - 99 mg/dL   BUN 13 6 - 24 mg/dL   Creatinine, Ser 0.51 (L) 0.76 - 1.27 mg/dL   GFR calc non Af Amer 130 >59 mL/min/1.73   GFR calc Af Amer 150 >59 mL/min/1.73   BUN/Creatinine Ratio 25 (H) 9 - 20   Sodium 138 134 - 144 mmol/L   Potassium 4.3 3.5 - 5.2 mmol/L   Chloride 101 96 - 106 mmol/L   CO2 22 20 - 29 mmol/L   Calcium 9.3 8.7 - 10.2 mg/dL   Total Protein 6.8 6.0 - 8.5 g/dL   Albumin 4.3 4.0 - 5.0 g/dL   Globulin, Total 2.5 1.5 - 4.5 g/dL   Albumin/Globulin Ratio 1.7 1.2 - 2.2   Bilirubin Total 0.6 0.0 - 1.2 mg/dL   Alkaline Phosphatase 67 44 - 121 IU/L   AST 257 (H) 0 - 40 IU/L   ALT 300 (H) 0 - 44 IU/L   Sedimentation rate  Result Value Ref Range   Sed Rate 17 (H) 0 - 15 mm/hr  C-reactive protein  Result Value Ref Range   CRP 3 0 - 10 mg/L  TSH  Result Value Ref Range   TSH 2.970 0.450 - 4.500 uIU/mL  CBC  Result Value Ref Range   WBC 5.6 3.4 - 10.8 x10E3/uL   RBC 5.32 4.14 - 5.80 x10E6/uL   Hemoglobin 15.5 13.0 - 17.7 g/dL  Hematocrit 46.0 37.5 - 51.0 %   MCV 87 79 - 97 fL   MCH 29.1 26.6 - 33.0 pg   MCHC 33.7 31.5 - 35.7 g/dL   RDW 12.3 11.6 - 15.4 %   Platelets 285 150 - 450 x10E3/uL  CK (Creatine Kinase)  Result Value Ref Range   Total CK 17,530 (HH) 49 - 439 U/L      Assessment & Plan:   Problem List Items Addressed This Visit    Type 2 diabetes mellitus with microalbuminuria, without long-term current use of insulin (HCC) - Primary    Well-controlled DM with A1c 6.0-6.2 range, offered repeat A1c however we defer today due to high dose Prednisone course will artificially increase A1c Not on medication Complications - hyperlipidemia, CAD - increases risk of future cardiovascular complications  Plan:  1. Continue current diet controlled / lifestyle regimen 2. Encourage improved lifestyle - low carb, low sugar diet, reduce portion size, continue improving regular exercise 3. Check CBG, bring log to next visit for review 4. Continue ASA, ACEi - Off Repatha for now.  Future consider GLP1 agent for cardioprotective benefits      Hyperlipidemia associated with type 2 diabetes mellitus ([place])    Dramatic improved control on Repatha PCSK9 inhibitor - now OFF Repatha for now due to pending myositis work up CAD s/p MI, DM2 Off statins due to intolerance (atorvastatin, rosuvastatin), also prior on lovaza, zetia  Plan: 1. Hold repatha for now 2. Continue ASA 38m for secondary ASCVD risk reduction 3. Encourage improved lifestyle - low carb/cholesterol, reduce portion size, continue improving regular exercise       Other Visit Diagnoses    Myositis of multiple sites,  unspecified myositis type          Complex current problem in progress [facility] also has seen Gen Surgery for muscle biopsy.  Current status - pending muscle biopsy for confirmatory diagnosis most likely. To determine if statin induced myopathy or if autoimmune myositis  On high dose Prednisone 665mdaily still with clinical benefit overall but seems less effective in past few days to week, he will return to Rheumatology once has muscle biopsy.   Discussed caution with high dose prednisone and chronic usage, risk of HPA axis suppression with exogenous steroid medicine, advised that he be cautious about continuing it as prescribed and not to stop it suddenly, should work with Rheumatology for appropriate taper on dose, he will notify I need any re order. Hopefully can go to lower dose in near future, due to risks and impact on blood sugar.  Await further specialist review and management / diagnosis of underlying problem.  No orders of the defined types were placed in this encounter.     Follow up plan: Return in about 3 months (around 01/27/2021) for 3 month DM A1c, Foot exam, -Updates Rheum/Neuro.   [facility] 10/27/2020, 8:35 AM

## 2020-10-27 NOTE — Patient Instructions (Addendum)
Thank you for coming to the office today.  Elevated sugars are expected. May go even higher on prednisone. Okay if < 150-170.  Next time can check A1c / Feet.  Awaiting muscle biopsy, follow up with Dr Posey Pronto for medication management.  Caution avoid sudden stopping of Prednisone 60mg , would need a gradual taper most likely from Dr Posey Pronto.   Please schedule a Follow-up Appointment to: Return in about 3 months (around 01/27/2021) for 3 month DM A1c, Foot exam, -Updates Rheum/Neuro.  If you have any other questions or concerns, please feel free to call the office or send a message through [place]. You may also schedule an earlier appointment if necessary.  Additionally, you may be receiving a survey about your experience at our office within a few days to 1 week by e-mail or mail. We value your feedback.  Nobie Putnam, DO [place]

## 2020-10-27 NOTE — Assessment & Plan Note (Signed)
Well-controlled DM with A1c 6.0-6.2 range, offered repeat A1c however we defer today due to high dose Prednisone course will artificially increase A1c Not on medication Complications - hyperlipidemia, CAD - increases risk of future cardiovascular complications  Plan:  1. Continue current diet controlled / lifestyle regimen 2. Encourage improved lifestyle - low carb, low sugar diet, reduce portion size, continue improving regular exercise 3. Check CBG, bring log to next visit for review 4. Continue ASA, ACEi - Off Repatha for now.  Future consider GLP1 agent for cardioprotective benefits

## 2020-10-27 NOTE — Assessment & Plan Note (Signed)
Dramatic improved control on Repatha PCSK9 inhibitor - now OFF Repatha for now due to pending myositis work up CAD s/p MI, DM2 Off statins due to intolerance (atorvastatin, rosuvastatin), also prior on lovaza, zetia  Plan: 1. Hold repatha for now 2. Continue ASA 81mg  for secondary ASCVD risk reduction 3. Encourage improved lifestyle - low carb/cholesterol, reduce portion size, continue improving regular exercise

## 2020-10-30 IMAGING — US US ABDOMEN LIMITED
1 series · 14 of 25 positions shown · non-contrast
Comparison: None.

CLINICAL DATA: Abnormal LFTs.

EXAM:
ULTRASOUND ABDOMEN LIMITED RIGHT UPPER QUADRANT

[Series 1: us abdomen limited · 14 of 35 slices shown]
[im 1/35]
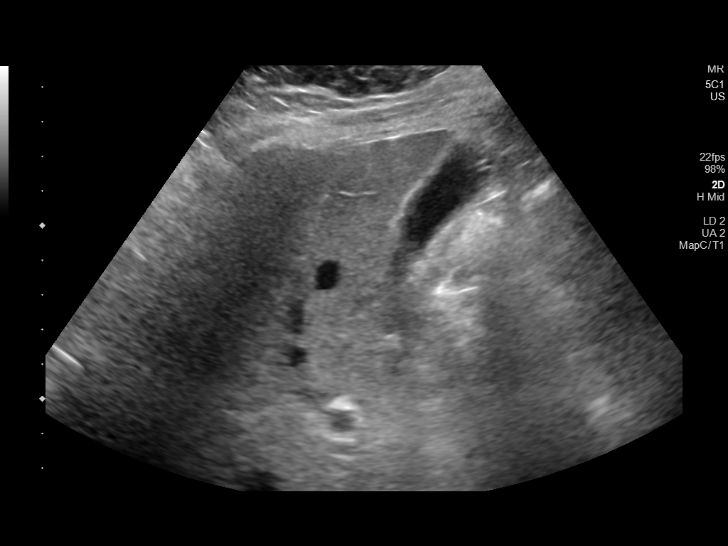
[im 3/35]
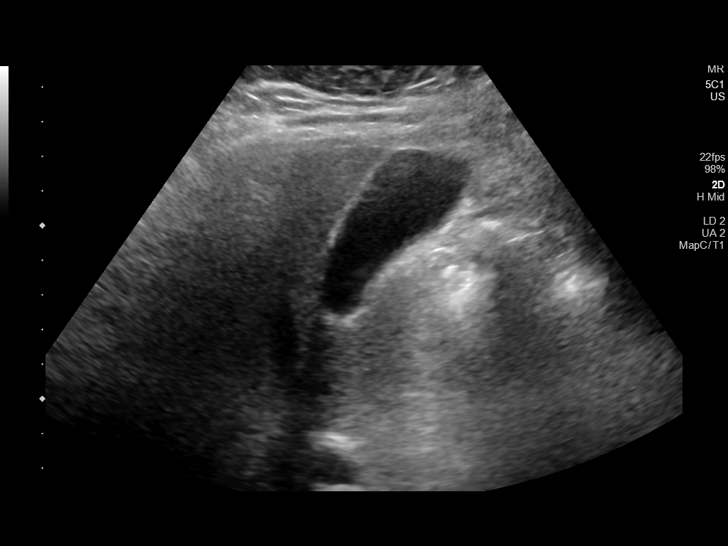
[im 6/35]
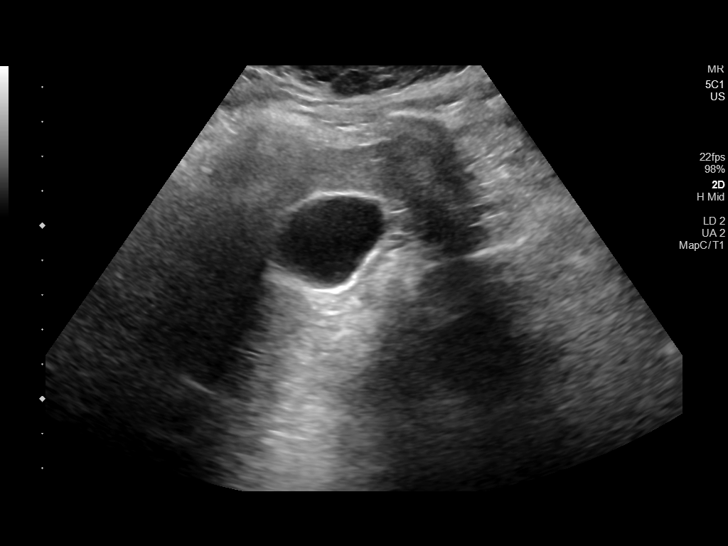
[im 9/35]
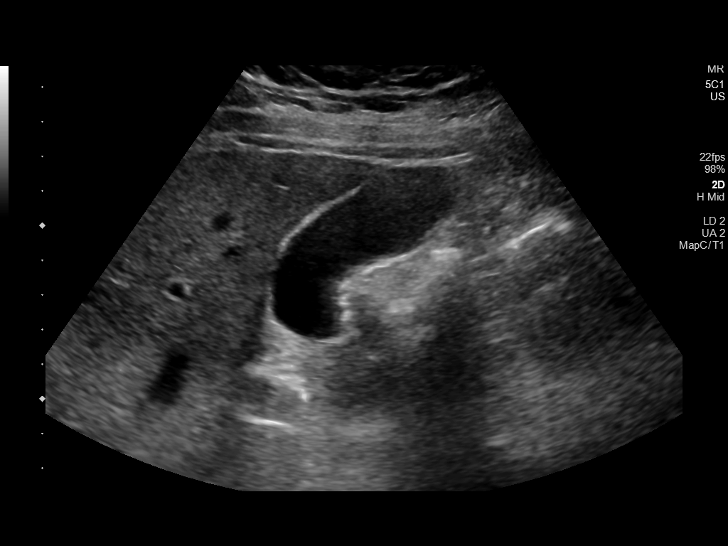
[im 12/35]
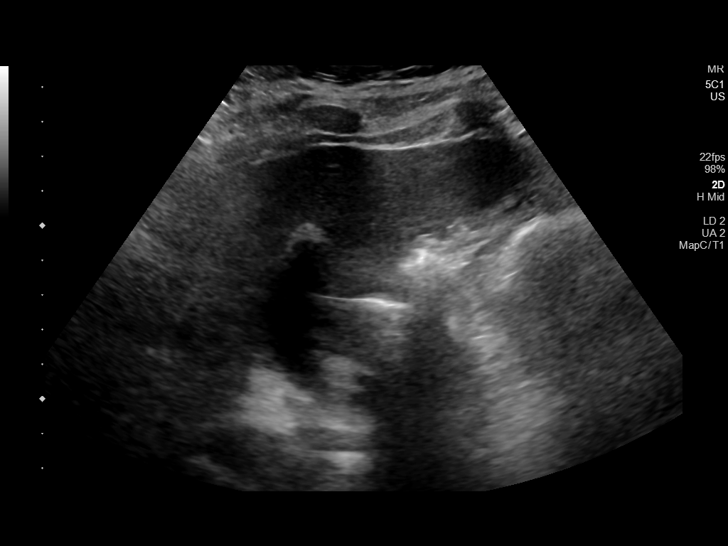
[im 13/35]
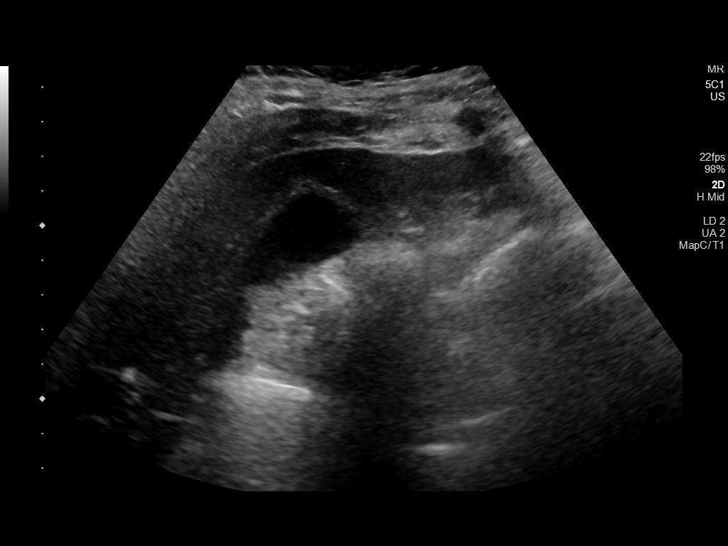
[im 16/35]
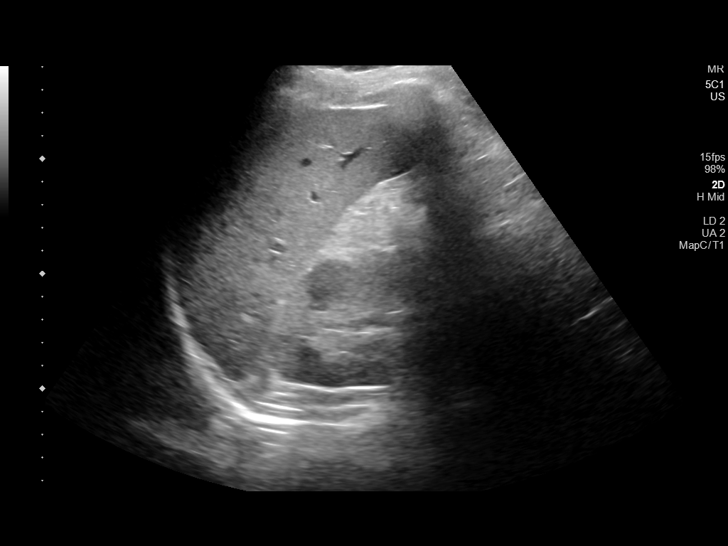
[im 19/35]
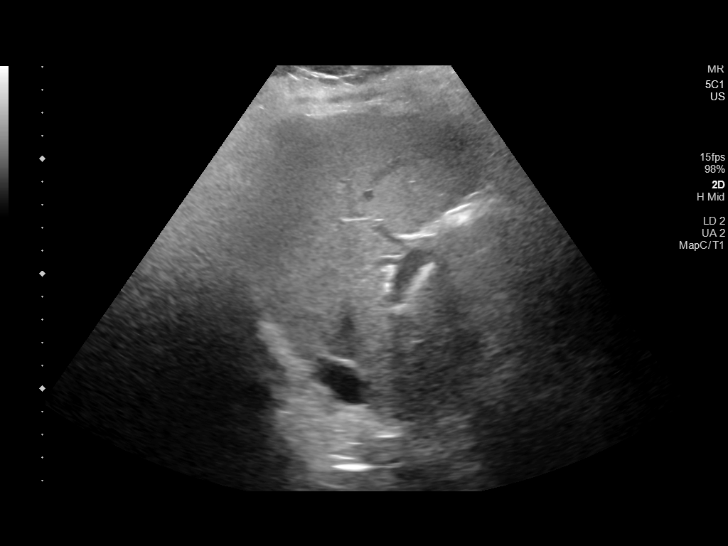
[im 22/35]
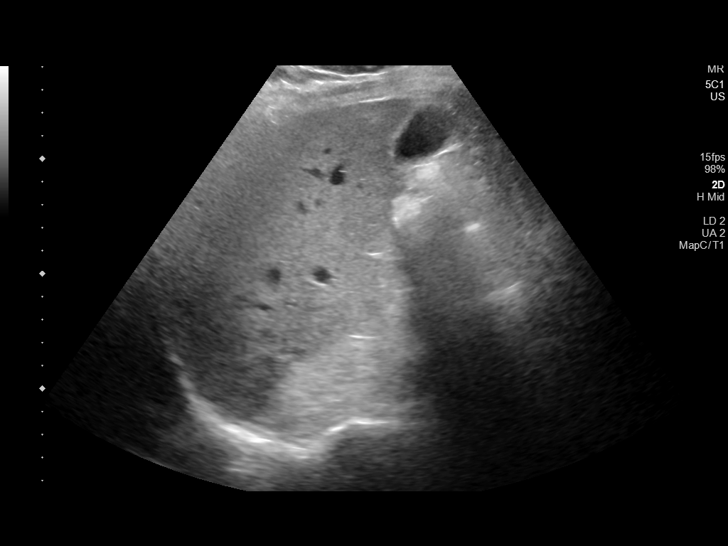
[im 23/35]
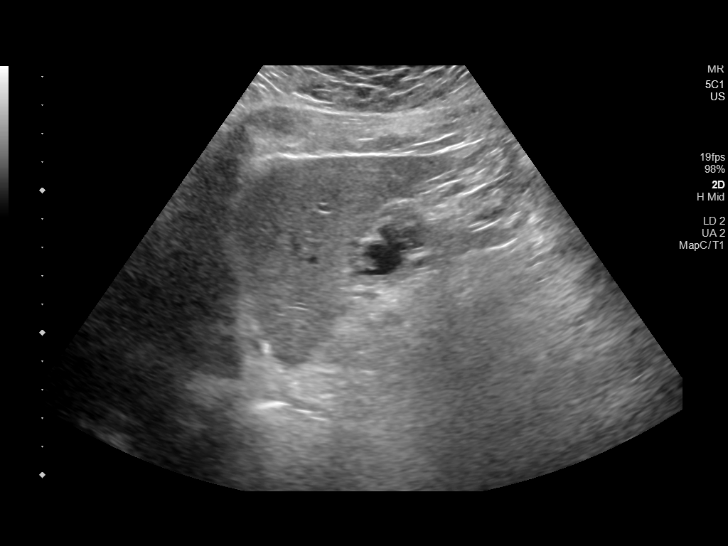
[im 26/35]
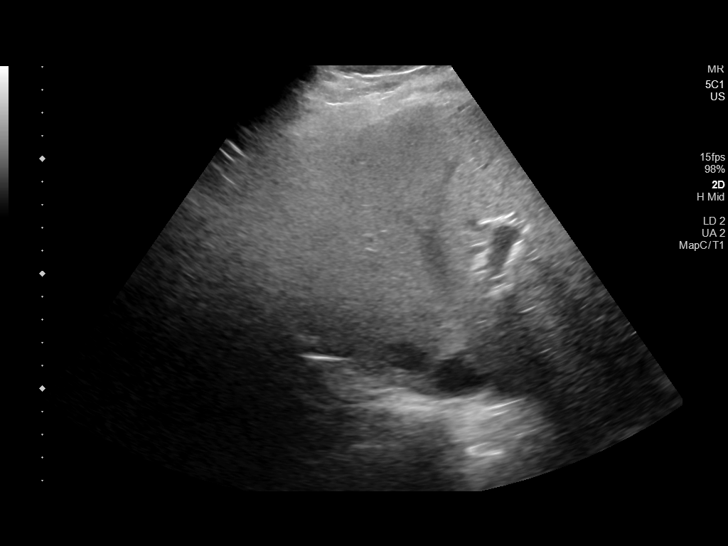
[im 29/35]
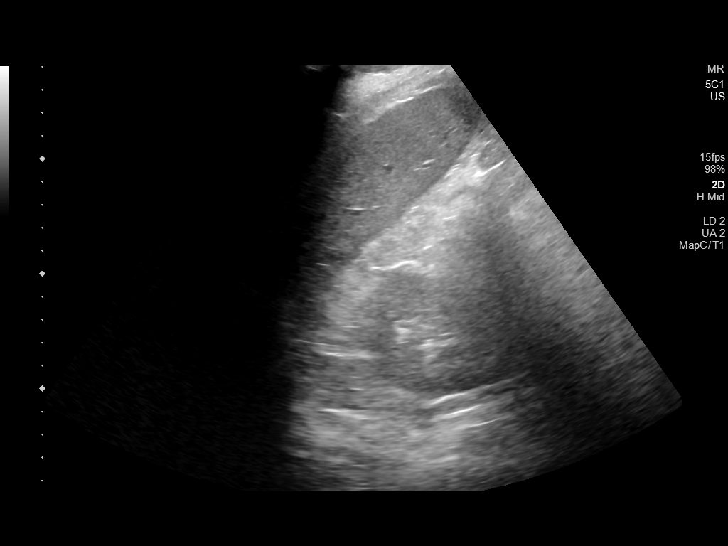
[im 32/35]
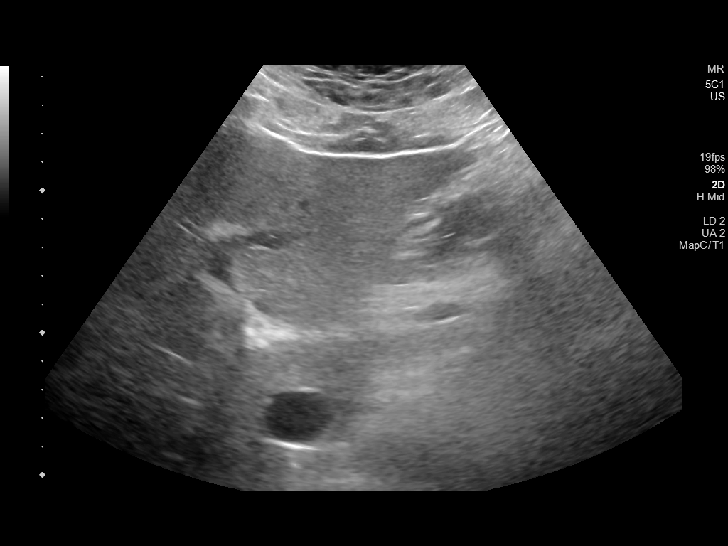
[im 35/35]
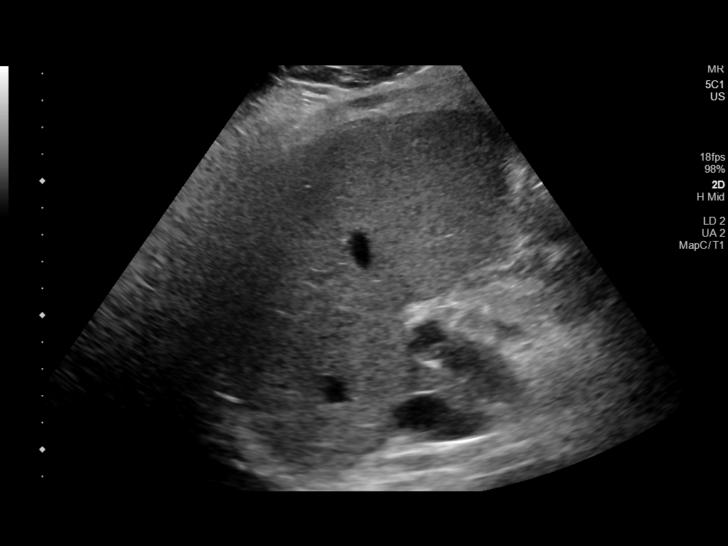

[14 of 25 positions shown; findings below may reference images not displayed]

FINDINGS: Gallbladder:

No gallstones or wall thickening visualized. No sonographic Murphy
sign noted by sonographer.

Common bile duct:

Diameter: 0.4 cm, within normal

Liver:

No focal lesion identified. Parenchymal echogenicity may be slightly
increased. Portal vein is patent on color Doppler imaging with
normal direction of blood flow towards the liver.

Other: None.
IMPRESSION: Possible mild increase in liver parenchymal echogenicity as can be
seen in early fatty infiltration. No focal lesion identified.

## 2020-11-03 DIAGNOSIS — Z7952 Long term (current) use of systemic steroids: Secondary | ICD-10-CM | POA: Insufficient documentation

## 2020-11-17 DIAGNOSIS — G720 Drug-induced myopathy: Secondary | ICD-10-CM | POA: Insufficient documentation

## 2020-11-18 ENCOUNTER — Other Ambulatory Visit: Payer: Self-pay | Admitting: Family Medicine

## 2020-11-18 DIAGNOSIS — E782 Mixed hyperlipidemia: Secondary | ICD-10-CM

## 2020-11-30 ENCOUNTER — Other Ambulatory Visit: Payer: Managed Care, Other (non HMO)

## 2020-11-30 ENCOUNTER — Encounter: Payer: Managed Care, Other (non HMO) | Admitting: Oncology

## 2021-01-28 ENCOUNTER — Encounter: Payer: Self-pay | Admitting: General Surgery

## 2021-01-28 LAB — SURGICAL PATHOLOGY

## 2021-02-01 ENCOUNTER — Telehealth: Payer: Self-pay | Admitting: Family Medicine

## 2021-02-01 NOTE — Telephone Encounter (Signed)
Cassandra, from [employer], calling stating that the pt is needing to have a PA for the medication, Vascepa. She states that they have received an error issue and is needing to contact pt. She states that they are needing to verify the pharmacy the pt uses for his medications. Please advise.      [place]

## 2021-02-03 ENCOUNTER — Ambulatory Visit: Payer: Managed Care, Other (non HMO) | Admitting: Family Medicine

## 2021-02-05 NOTE — Telephone Encounter (Signed)
  Reason for CRM: Loa Socks with [employer] called regarding PA for VASCEPA 1 g capsule. Loa Socks stated the patient demographics and billing information is needed. Cb# [phone] Reference key# [id number]   I am waiting to hear back from the pt because when I tried to submit the prior auth, it states that he is inactive for the [employer] ins that we currently have on fine. I left him a message to call back to obtain updated information concerning this matter. Will await call back to proceed.

## 2021-02-08 ENCOUNTER — Telehealth: Payer: Self-pay

## 2021-02-08 NOTE — Telephone Encounter (Signed)
[facility]  PA  dept called stating that Stephen [name] has been denied . [id number] if you would like to appeal

## 2021-02-10 NOTE — Telephone Encounter (Signed)
Pharmacy called to advise that Vascepa needed a PA/ please advise

## 2021-02-10 NOTE — Telephone Encounter (Signed)
It was denied.

## 2021-04-06 ENCOUNTER — Ambulatory Visit: Payer: Self-pay | Admitting: Family Medicine

## 2021-04-16 ENCOUNTER — Encounter: Payer: Self-pay | Admitting: Family Medicine

## 2021-04-16 ENCOUNTER — Other Ambulatory Visit: Payer: Self-pay

## 2021-04-16 ENCOUNTER — Ambulatory Visit (INDEPENDENT_AMBULATORY_CARE_PROVIDER_SITE_OTHER): Payer: 59 | Admitting: Family Medicine

## 2021-04-16 ENCOUNTER — Other Ambulatory Visit: Payer: Self-pay | Admitting: Family Medicine

## 2021-04-16 VITALS — BP 120/79 | HR 66 | Ht 70.0 in | Wt 193.2 lb

## 2021-04-16 DIAGNOSIS — M3322 Polymyositis with myopathy: Secondary | ICD-10-CM | POA: Diagnosis not present

## 2021-04-16 DIAGNOSIS — E785 Hyperlipidemia, unspecified: Secondary | ICD-10-CM

## 2021-04-16 DIAGNOSIS — E1169 Type 2 diabetes mellitus with other specified complication: Secondary | ICD-10-CM

## 2021-04-16 DIAGNOSIS — E1129 Type 2 diabetes mellitus with other diabetic kidney complication: Secondary | ICD-10-CM

## 2021-04-16 DIAGNOSIS — R809 Proteinuria, unspecified: Secondary | ICD-10-CM | POA: Diagnosis not present

## 2021-04-16 DIAGNOSIS — R000 Tachycardia, unspecified: Secondary | ICD-10-CM | POA: Diagnosis not present

## 2021-04-16 LAB — POCT GLYCOSYLATED HEMOGLOBIN (HGB A1C): Hemoglobin A1C: 8.1 % — AB (ref 4.0–5.6)

## 2021-04-16 MED ORDER — LOVAZA 1 G PO CAPS: 1.0000 g | ORAL_CAPSULE | Freq: Two times a day (BID) | ORAL | 1 refills | Status: DC

## 2021-04-16 MED ORDER — METFORMIN HCL ER 500 MG PO TB24: 1000.0000 mg | ORAL_TABLET | Freq: Every day | ORAL | 1 refills | Status: DC

## 2021-04-16 NOTE — Progress Notes (Signed)
Subjective:    Patient ID: Stephen [name], male    DOB: Dec 06, 1975, 45 y.o.   MRN: [id number]  Stephen [name] is a 45 y.o. male presenting on 04/16/2021 for Diabetes   HPI  CHRONIC DM, Type 2 / Overweight BMI >28 Prior A1c 6 range, last 05/2020 was 6.2 CBGs: Avg 115-130 range on prednisone now, checks CBG daily Meds: None Currently on ACEi Lifestyle: - Diet off alcohol, improved diet - Exercise - unable to resume due to muscle problem Last DM Eye 11/2019 - fam history diabetes Denies hypoglycemia, polyuria, visual changes, numbness or tingling.   HYPERLIPIDEMIA / Elevated LFTs / CAD PMH MI x 3 in 2018, followed by Dr Fletcher Anon Cardiology - Reports concerns with hyperlipidemia / fatty liver history - Taken off Statin therapy and started on Repatha PCKS9 inhib in 02/2020 Now concern with current situation question if it is statin myopathy - now off Repatha temporarily   Polymyositis with myopathy Followed by [facility] Rheumatology Dr Posey Pronto Still weakness, loss of muscle mass. - Significant improvement after IV IG treatment (6 months, started 02/2021) just finished 3rd treatment other day - Admits issue with elevated heart rate with tachycardia 120s while eating, not every time, worse if busy and active then sitting down to eat. - Still waiting on CK and Muscle Enzymes levels to reduce, before Rheumatology will reduce Prednisone below '50mg'$  daily    Depression screen [facility] 2/9 04/16/2021 05/26/2020 11/19/2019  Decreased Interest 0 0 0  Down, Depressed, Hopeless 0 0 0  PHQ - 2 Score 0 0 0  Altered sleeping 0 - -  Tired, decreased energy 1 - -  Change in appetite 0 - -  Feeling bad or failure about yourself  0 - -  Trouble concentrating 0 - -  Moving slowly or fidgety/restless 0 - -  Suicidal thoughts 0 - -  PHQ-9 Score 1 - -  Difficult doing work/chores Not difficult at all - -    Social History   Tobacco Use   Smoking status: Former    Packs/day: 0.10    Years: 3.00    Pack years: 0.30     Types: Cigarettes    Quit date: 08/15/1994    Years since quitting: 26.6   Smokeless tobacco: Never   Tobacco comments:    weekend smoker, pt would only smoke bout 1 -2 cig on the weekend.   Vaping Use   Vaping Use: Never used  Substance Use Topics   Alcohol use: Yes    Comment: on week-ends   Drug use: No    Review of Systems Per HPI unless specifically indicated above     Objective:    BP 120/79 (BP Location: Left Arm, Patient Position: Sitting, Cuff Size: Normal)   Pulse 66   Ht '5\' 10"'$  (1.778 m)   Wt 193 lb 3.2 oz (87.6 kg)   BMI 27.72 kg/m   Wt Readings from Last 3 Encounters:  04/16/21 193 lb 3.2 oz (87.6 kg)  10/27/20 199 lb 9.6 oz (90.5 kg)  10/13/20 200 lb (90.7 kg)    Physical Exam Vitals and nursing note reviewed.  Constitutional:      General: He is not in acute distress.    Appearance: Normal appearance. He is well-developed. He is not diaphoretic.     Comments: Well-appearing, comfortable, cooperative  HENT:     Head: Normocephalic and atraumatic.  Eyes:     General:        Right eye: No discharge.  Left eye: No discharge.     Conjunctiva/sclera: Conjunctivae normal.  Cardiovascular:     Rate and Rhythm: Normal rate.  Pulmonary:     Effort: Pulmonary effort is normal.  Skin:    General: Skin is warm and dry.     Findings: No erythema or rash.  Neurological:     Mental Status: He is alert and oriented to person, place, and time.  Psychiatric:        Mood and Affect: Mood normal.        Behavior: Behavior normal.        Thought Content: Thought content normal.     Comments: Well groomed, good eye contact, normal speech and thoughts    Diabetic Foot Exam - Simple   Simple Foot Form Diabetic Foot exam was performed with the following findings: Yes 04/16/2021  9:24 AM  Visual Inspection No deformities, no ulcerations, no other skin breakdown bilaterally: Yes Sensation Testing Intact to touch and monofilament testing bilaterally: Yes Pulse  Check Posterior Tibialis and Dorsalis pulse intact bilaterally: Yes Comments       Results for orders placed or performed in visit on 04/16/21  POCT HgB A1C  Result Value Ref Range   Hemoglobin A1C 8.1 (A) 4.0 - 5.6 %      Assessment & Plan:   Problem List Items Addressed This Visit     Type 2 diabetes mellitus with microalbuminuria, without long-term current use of insulin ([place]) - Primary    Uncontrolled A1c up to >8 now with prednisone course and current polymyositis illness Complications - hyperlipidemia, CAD - increases risk of future cardiovascular complications  Plan:  1. RESTART Metformin XR 500-'1000mg'$  daily for now, we can reconsider GLP1 therapy in future if indicated  Continue current diet controlled / lifestyle regimen 2. Encourage improved lifestyle - low carb, low sugar diet, reduce portion size, continue improving regular exercise 3. Check CBG, bring log to next visit for review 4. Continue ASA, ACEi - Off Repatha for now.  Future consider GLP1 agent for cardioprotective benefits      Relevant Medications   metFORMIN (GLUCOPHAGE XR) 500 MG 24 hr tablet   Other Relevant Orders   POCT HgB A1C (Completed)   Polymyositis with myopathy ([place])   Hyperlipidemia associated with type 2 diabetes mellitus ([place])    Previously Dramatic improved control on Repatha PCSK9 inhibitor Now off med CAD s/p MI, DM2 Off statins due to intolerance (atorvastatin, rosuvastatin), also prior on lovaza, zetia Off Repatha while dx with polymyositis, awaiting further input from Rheum/Cards  Plan: 1. Order Lovaza if can get covered, previously on Vascepa not covered. 2. Continue ASA '81mg'$  for secondary ASCVD risk reduction 3. Encourage improved lifestyle - low carb/cholesterol, reduce portion size, continue improving regular exercise  Return to Cardiology as planned      Relevant Medications   LOVAZA 1 g capsule   metFORMIN (GLUCOPHAGE XR) 500 MG 24 hr tablet   Other Visit  Diagnoses     Inappropriate sinus tachycardia       Relevant Medications   LOVAZA 1 g capsule       Polymyositis with myopathy Followed by Rheumatology On IV IG therapy, Prednisone Continues to follow up with their management Goal to hopefully wean down prednisone with PT coming up  Inappropriate Sinus Tachy Seems only when eating temporarily Counseling could be related to polymyositis / steroid Otherwise, he can return to cardiology as already due for cholesterol follow-up can review this, am hesitant to add  new Beta blocker or other med today if this is only sporadic issue.     Meds ordered this encounter  Medications   LOVAZA 1 g capsule    Sig: Take 1 capsule (1 g total) by mouth 2 (two) times daily.    Dispense:  180 capsule    Refill:  1   metFORMIN (GLUCOPHAGE XR) 500 MG 24 hr tablet    Sig: Take 2 tablets (1,000 mg total) by mouth daily with breakfast.    Dispense:  180 tablet    Refill:  1      Follow up plan: Return in about 3 months (around 07/16/2021) for 3 month follow-up DM A1c.   Nobie Putnam, [facility] 04/16/2021, 9:07 AM

## 2021-04-16 NOTE — Telephone Encounter (Signed)
Requested medication (s) are due for refill today: see encounter  Requested medication (s) are on the active medication list: yes  Last refill:  04/16/21 #180 1 refill  Future visit scheduled: yes in 3 months  Notes to clinic:   Pharmacy comment: insurance wont cover lovasa.         Please advise    Requested Prescriptions  Pending Prescriptions Disp Refills   LOVAZA 1 g capsule [Pharmacy Med Name: LOVAZA 1GM CAP] 180 capsule 1    Sig: TAKE 1 CAPSULE BY MOUTH TWICE DAILY     Endocrinology:  Nutritional Agents Passed - 04/16/2021  1:14 PM      Passed - Valid encounter within last 12 months    Recent Outpatient Visits           Today Type 2 diabetes mellitus with microalbuminuria, without long-term current use of insulin [facility])   [place], DO   5 months ago Type 2 diabetes mellitus with microalbuminuria, without long-term current use of insulin [facility])   [place], DO   6 months ago Elevated [place], DO   10 months ago Annual physical exam   [place], Devonne Doughty, DO   1 year ago Type 2 diabetes mellitus with microalbuminuria, without long-term current use of insulin [facility])   [place], Devonne Doughty, DO       Future Appointments             In 4 days Fletcher Anon, Mertie Clause, MD [facility], [place]   In 3 months [place], Devonne Doughty, [facility], [facility]

## 2021-04-16 NOTE — Patient Instructions (Addendum)
Thank you for coming to the office today.  Recent Labs    05/19/20 0805 04/16/21 0916  HGBA1C 6.2* 8.1*   Contact Cardiology - Discuss the "Inappropriate Sinus Tachycardia" - may benefit from a beta blocker BP medication that can help maintain heart rate or slow it down.  Ordered Lovaza option for fish oil, if not covered could do OTC Fish Oil instead.  Ask them about alternative cholesterol therapy.  Start back with XR Metformin '500mg'$  daily with meal, after few weeks if tolerating well can go up to 2 pills per day in morning.  Future we can consider the newer diabetic medicines, injectable Ozempic, Trulicity - once weekly shot for lowering A1c, weight loss / appetite suppression and heart protection.   Please schedule a Follow-up Appointment to: Return in about 3 months (around 07/16/2021) for 3 month follow-up DM A1c.  If you have any other questions or concerns, please feel free to call the office or send a message through [place]. You may also schedule an earlier appointment if necessary.  Additionally, you may be receiving a survey about your experience at our office within a few days to 1 week by e-mail or mail. We value your feedback.  Nobie Putnam, DO [place]

## 2021-04-16 NOTE — Assessment & Plan Note (Signed)
Uncontrolled A1c up to >8 now with prednisone course and current polymyositis illness Complications - hyperlipidemia, CAD - increases risk of future cardiovascular complications  Plan:  1. RESTART Metformin XR 500-'1000mg'$  daily for now, we can reconsider GLP1 therapy in future if indicated  Continue current diet controlled / lifestyle regimen 2. Encourage improved lifestyle - low carb, low sugar diet, reduce portion size, continue improving regular exercise 3. Check CBG, bring log to next visit for review 4. Continue ASA, ACEi - Off Repatha for now.  Future consider GLP1 agent for cardioprotective benefits

## 2021-04-16 NOTE — Assessment & Plan Note (Addendum)
Previously Dramatic improved control on Repatha PCSK9 inhibitor Now off med CAD s/p MI, DM2 Off statins due to intolerance (atorvastatin, rosuvastatin), also prior on lovaza, zetia Off Repatha while dx with polymyositis, awaiting further input from Rheum/Cards  Plan: 1. Order Lovaza if can get covered, previously on Vascepa not covered. 2. Continue ASA '81mg'$  for secondary ASCVD risk reduction 3. Encourage improved lifestyle - low carb/cholesterol, reduce portion size, continue improving regular exercise  Return to Cardiology as planned

## 2021-04-17 IMAGING — CT CT CHEST W/O CM
2 of 4 series · 15 of 36 positions shown, 18 images · non-contrast
Comparison: Report of PA and lateral chest 09/29/2020 reviewed.
Images are not available.

CLINICAL DATA: Dyspnea.

EXAM:
CT CHEST WITHOUT CONTRAST
TECHNIQUE: Multidetector CT imaging of the chest was performed following the
standard protocol without IV contrast.

[Series 2: chest 2.00 · axial · 0.81mm/px · z∈[-1193,-907]mm · 12 of 169 slices shown, 15 images]
[im 13/169  mediastinal]
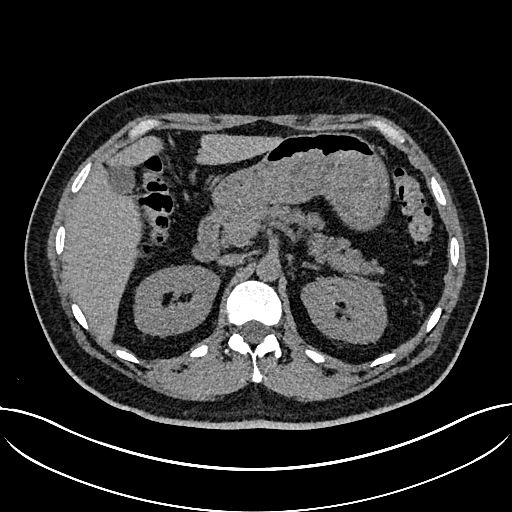
[im 13/169  lung]
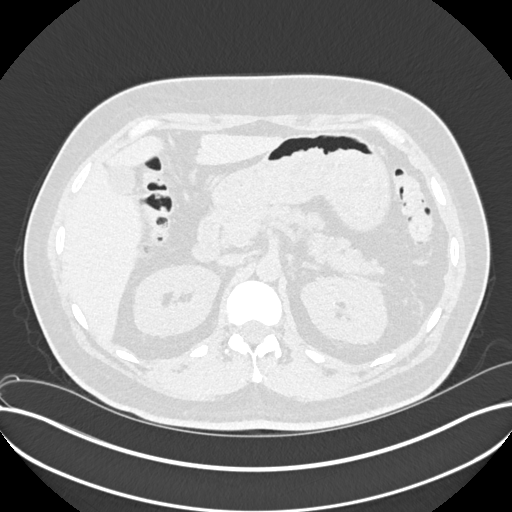
[im 26/169  lung]
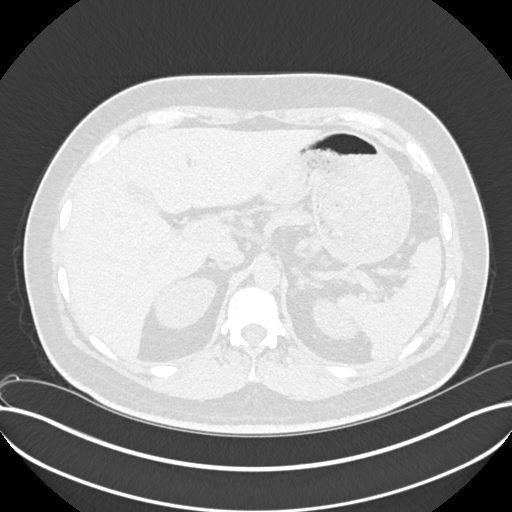
[im 39/169  lung]
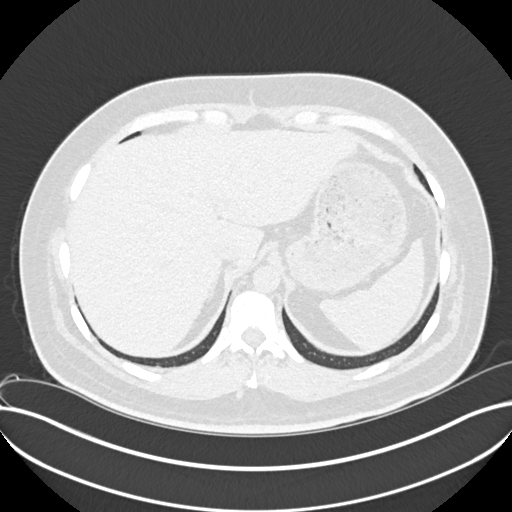
[im 52/169  lung]
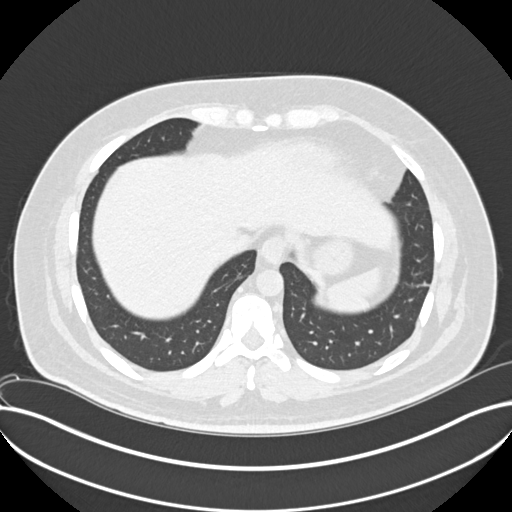
[im 65/169  mediastinal]
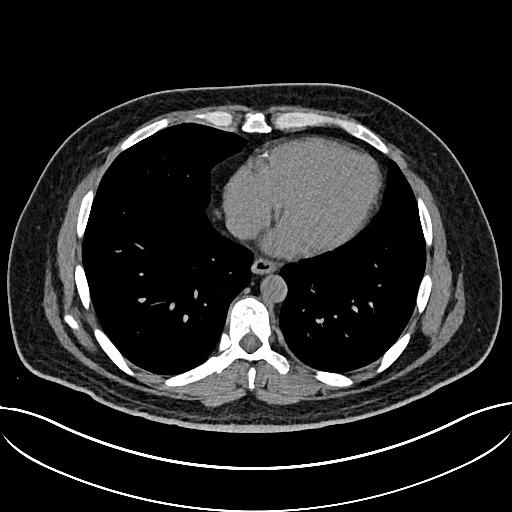
[im 65/169  lung]
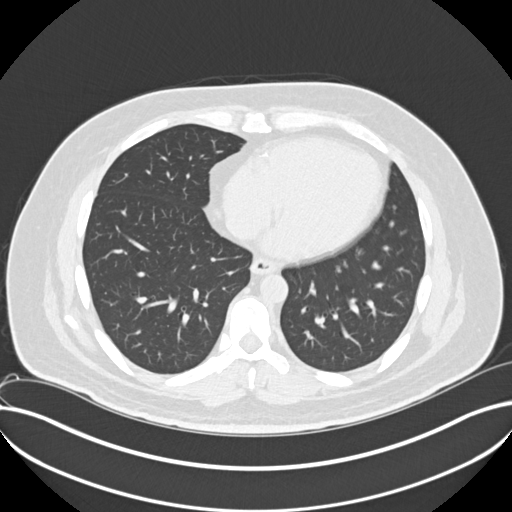
[im 78/169  lung]
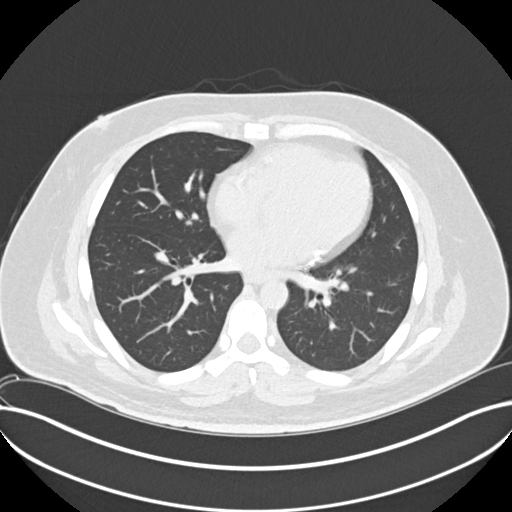
[im 91/169  lung]
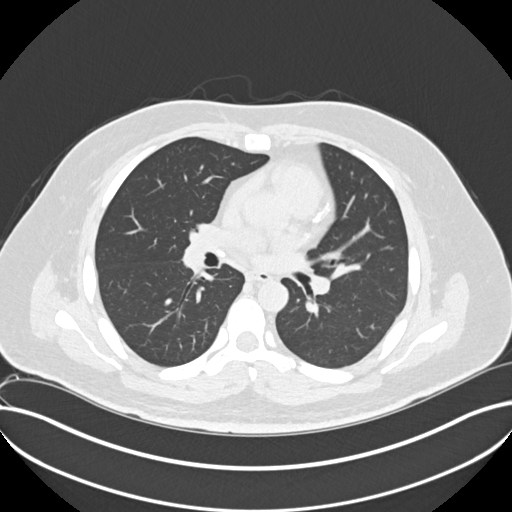
[im 104/169  lung]
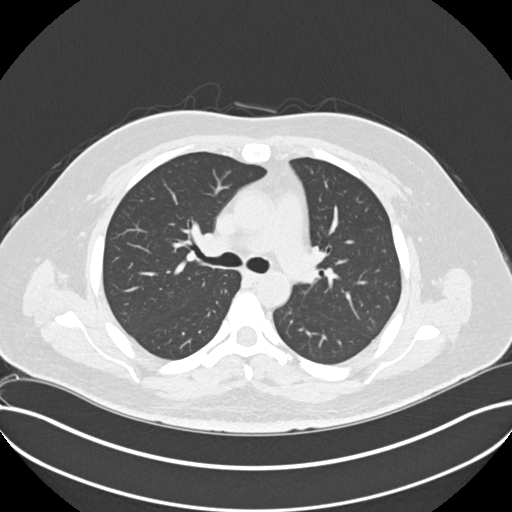
[im 117/169  mediastinal]
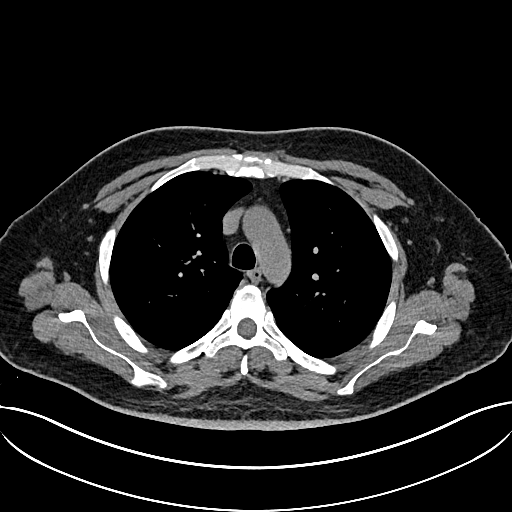
[im 117/169  lung]
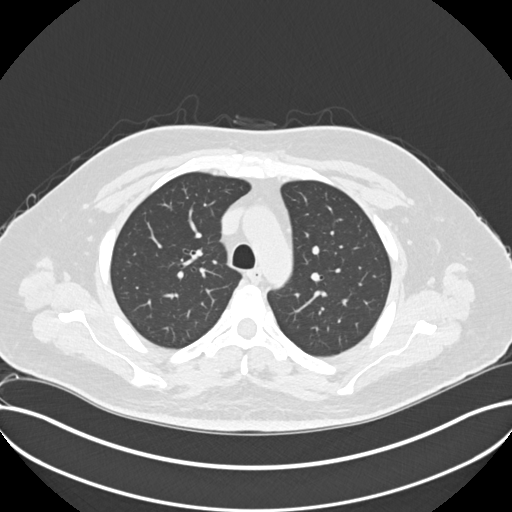
[im 130/169  lung]
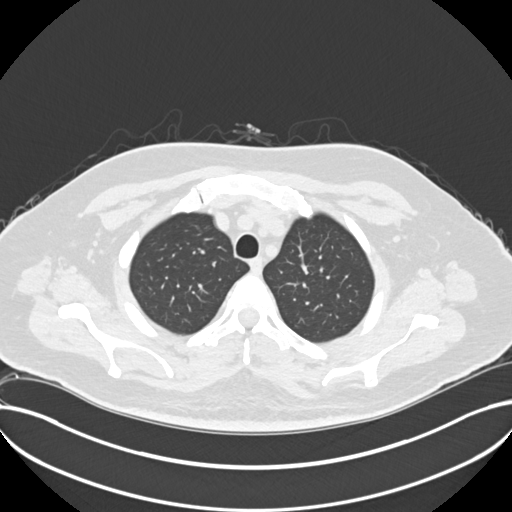
[im 143/169  lung]
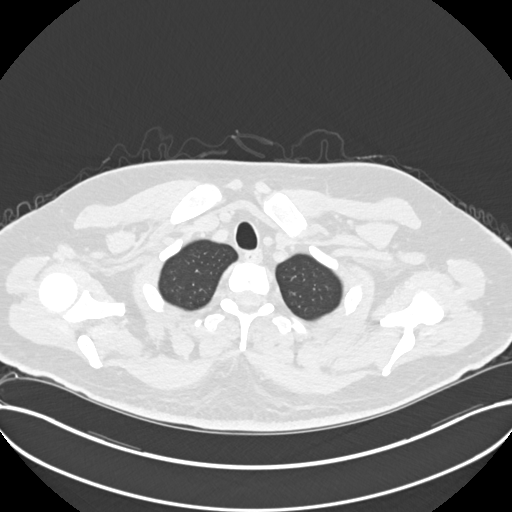
[im 156/169  lung]
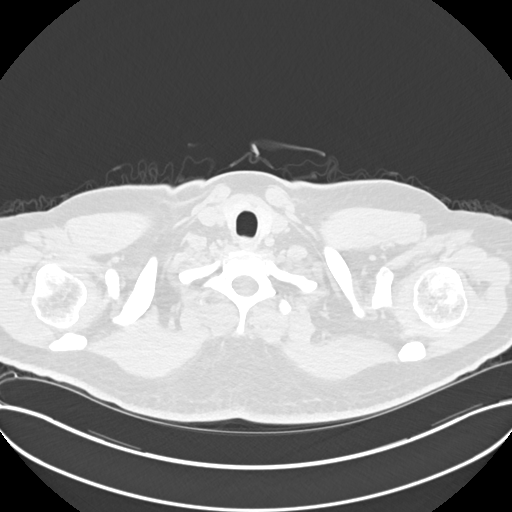

[Series 5: coronals chest 2.00 cor · coronal · 0.66mm/px · 3 of 181 slices shown]
[im 37/181  lung]
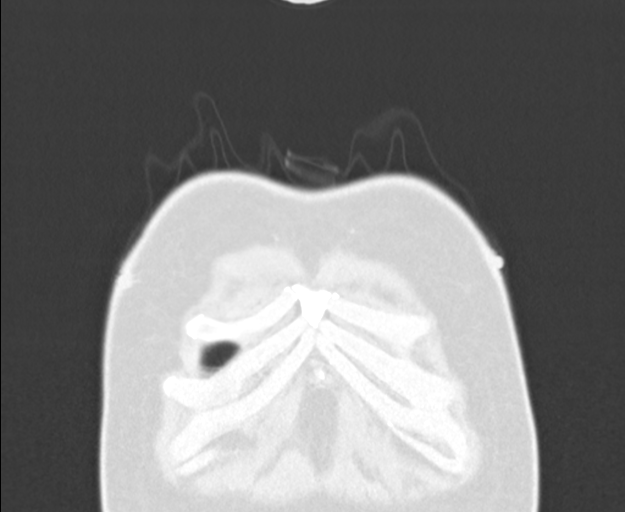
[im 73/181  lung]
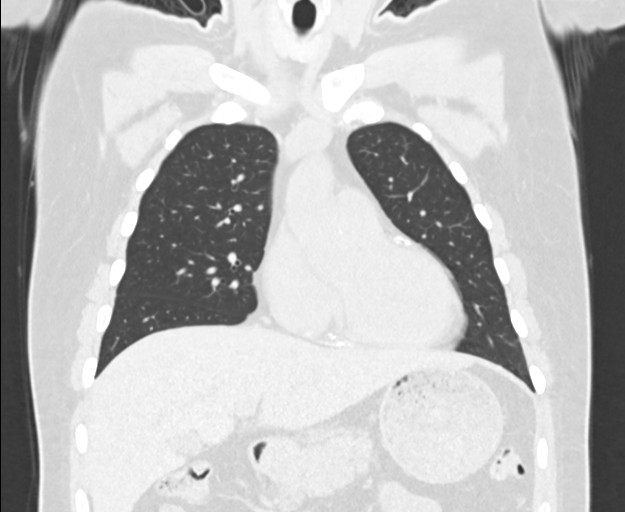
[im 109/181  lung]
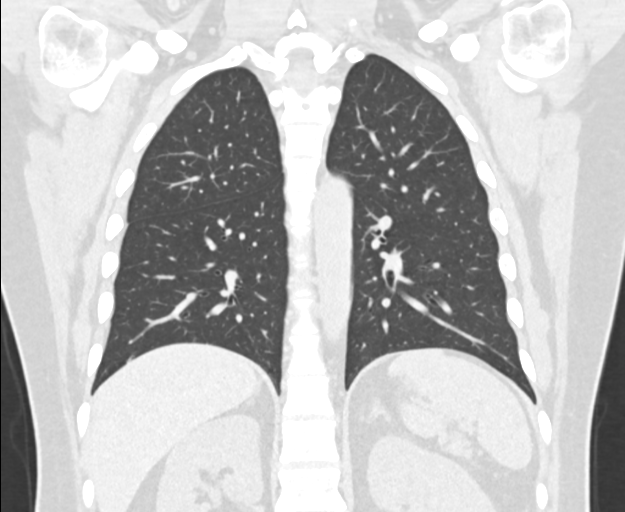

[15 of 36 positions shown; findings below may reference images not displayed]

FINDINGS: Cardiovascular: No significant vascular findings. Normal heart size.
No pericardial effusion. Extensive calcific coronary atherosclerosis
is noted.

Mediastinum/Nodes: No enlarged mediastinal or axillary lymph nodes.
Thyroid gland, trachea, and esophagus demonstrate no significant
findings. Calcified mediastinal and hilar lymph nodes are consistent
with old granulomatous disease.

Lungs/Pleura: No pleural effusion. Small focus of linear atelectasis
or scar is seen in the left lung base. The lungs are otherwise
clear.

Upper Abdomen: Negative.

Musculoskeletal: No acute or focal abnormality.
IMPRESSION: No finding to explain dyspnea.  The lungs are clear.

Calcified mediastinal and hilar lymph nodes consistent with old
granulomatous disease.

Extensive calcific coronary atherosclerosis.

## 2021-04-20 ENCOUNTER — Encounter: Payer: Self-pay | Admitting: Cardiovascular Disease

## 2021-04-20 ENCOUNTER — Ambulatory Visit (INDEPENDENT_AMBULATORY_CARE_PROVIDER_SITE_OTHER): Payer: 59 | Admitting: Cardiovascular Disease

## 2021-04-20 ENCOUNTER — Ambulatory Visit (INDEPENDENT_AMBULATORY_CARE_PROVIDER_SITE_OTHER): Payer: 59

## 2021-04-20 ENCOUNTER — Other Ambulatory Visit: Payer: Self-pay

## 2021-04-20 VITALS — BP 122/78 | HR 78 | Ht 70.0 in | Wt 197.0 lb

## 2021-04-20 DIAGNOSIS — R000 Tachycardia, unspecified: Secondary | ICD-10-CM

## 2021-04-20 DIAGNOSIS — R0602 Shortness of breath: Secondary | ICD-10-CM

## 2021-04-20 DIAGNOSIS — R002 Palpitations: Secondary | ICD-10-CM

## 2021-04-20 DIAGNOSIS — E782 Mixed hyperlipidemia: Secondary | ICD-10-CM

## 2021-04-20 DIAGNOSIS — E1129 Type 2 diabetes mellitus with other diabetic kidney complication: Secondary | ICD-10-CM

## 2021-04-20 DIAGNOSIS — I2510 Atherosclerotic heart disease of native coronary artery without angina pectoris: Secondary | ICD-10-CM | POA: Diagnosis not present

## 2021-04-20 DIAGNOSIS — R809 Proteinuria, unspecified: Secondary | ICD-10-CM

## 2021-04-20 MED ORDER — LISINOPRIL 2.5 MG PO TABS: 2.5000 mg | ORAL_TABLET | Freq: Every day | ORAL | 3 refills | Status: DC

## 2021-04-20 NOTE — Progress Notes (Unsigned)
[id number]

## 2021-04-20 NOTE — Progress Notes (Signed)
Cardiology Office Note   Date:  04/20/2021   ID:  Stephen [name], DOB 09/21/1975, MRN [id number]  PCP:  Olin Hauser, DO  Cardiologist:   Kathlyn Sacramento, MD   Chief Complaint  Patient presents with   Other    6 Month f/u no complaints today. Meds reviewed verbally with pt.       History of Present Illness: Stephen [name] is a 45 y.o. male who is here today for a follow-up regarding coronary artery disease.  He has known history of type 2 diabetes and hyperlipidemia.   He had unstable angina in September 2018 with abnormal nuclear stress test.  EF was normal by echo. Cardiac catheterization  showed significant underlying 2 -vessel coronary artery disease with subacute thrombotic occlusion of the mid to distal right coronary artery with left-to-right collaterals, occluded OM 3 with left to left collaterals, significant stenosis in a large OM 2 and moderate disease in second diagonal. The LAD was mildly and diffusely diseased. Ejection fraction was normal. Left ventricular end-diastolic pressure was high normal. He was treated medically initially but had worsening angina and thus  underwent successful PCI and drug-eluting stent placement to OM2.  The RCA was left to be treated medically given the well-developed collaterals.  He had elevated liver enzymes on both atorvastatin and rosuvastatin.  Thus, he was treated with Zetia and Vascepa for elevated triglyceride.  He had persistently elevated liver enzymes and thus he was referred to GI.  He was treated with Repatha.  He developed myalgia.  He was found to have significantly elevated CPK at 17,000.  The patient was referred to rheumatology and was diagnosed with polymyositis.  He was treated with prednisone and methotrexate.  His muscle strength deteriorated significantly in April and May and was started on IVIG with subsequent improvement.  He is still deconditioned. Recently, he started having exertional dyspnea without chest pain.   In addition, he had intermittent episodes of tachycardia especially after he eats.  Past Medical History:  Diagnosis Date   CAD (coronary artery disease)    a. LHC 04/2017: D2 60%, pLCx 30%, OM2 85%, OM3 100%L-L collats, m-dRCA 100% L-R collats, LVEF 55-65%; b. LHC 05/15/2017: D2 60%, pLCx 30%, OM2-1 85% s/p PCI/DES, OM2-2 30%, OM3 100% L-L collats, m-dRCA 100% L-R collats     Diabetes ([place])    History of echocardiogram    a. 04/2017: EF 55-60%, normal wall motion, normal LV diastolic function, no significant valvular abnormalities   Hyperlipidemia    Kidney stones 2002, 2008   no stone eval   Myocardial infarction [facility])     Past Surgical History:  Procedure Laterality Date   APPENDECTOMY  2000   CARDIAC CATHETERIZATION     CORONARY STENT INTERVENTION N/A 05/15/2017   Procedure: CORONARY STENT INTERVENTION;  Surgeon: Wellington Hampshire, MD;  Location: [facility];  Service: Cardiovascular;  Laterality: N/A;   LEFT HEART CATH AND CORONARY ANGIOGRAPHY Left 04/24/2017   Procedure: LEFT HEART CATH AND CORONARY ANGIOGRAPHY;  Surgeon: Wellington Hampshire, MD;  Location: [facility];  Service: Cardiovascular;  Laterality: Left;     Current Outpatient Medications  Medication Sig Dispense Refill   acetaminophen (TYLENOL) 500 MG tablet Take by mouth.     aspirin EC 81 MG tablet Take 1 tablet (81 mg total) by mouth daily. 90 tablet 3   Blood Glucose Monitoring Suppl (FIFTY50 GLUCOSE METER 2.0) w/Device KIT USE 1 TEST STRIP TO CHECK GLUCOSE UP TO  TWICE DAILY AS INSTRUCTED     CONTOUR NEXT TEST test strip USE 1 TEST STRIP TO CHECK GLUCOSE UP TO TWICE DAILY AS INSTRUCTED 660 each 2   folic acid (FOLVITE) 1 MG tablet Take 1 mg by mouth daily.     lisinopril (ZESTRIL) 2.5 MG tablet Take 1 tablet (2.5 mg total) by mouth daily. 90 tablet 3   LOVAZA 1 g capsule Take 1 capsule (1 g total) by mouth 2 (two) times daily. 180 capsule 1   metFORMIN (GLUCOPHAGE XR) 500 MG 24 hr tablet Take 2  tablets (1,000 mg total) by mouth daily with breakfast. 180 tablet 1   methotrexate (RHEUMATREX) 2.5 MG tablet Take by mouth once a week.     Microlet Lancets MISC USE 1 LANCET TO CHECK BLOOD SUGAR TWICE DAILY AS DIRECTED 100 each 4   nitroGLYCERIN (NITROSTAT) 0.4 MG SL tablet Place 1 tablet (0.4 mg total) under the tongue every 5 (five) minutes as needed for chest pain. 25 tablet 3   omeprazole (PRILOSEC) 20 MG capsule Take 20 mg by mouth daily.     predniSONE (DELTASONE) 20 MG tablet Take 20 mg by mouth in the morning, at noon, and at bedtime.     No current facility-administered medications for this visit.    Allergies:   Patient has no known allergies.    Social History:  The patient  reports that he quit smoking about 26 years ago. His smoking use included cigarettes. He has a 0.30 pack-year smoking history. He has never used smokeless tobacco. He reports current alcohol use. He reports that he does not use drugs.   Family History:  [name] family history includes CAD (age of onset: 39) in his mother; Cancer in his father; Cholecystitis in his father; Chronic Renal Failure in his mother; Diabetes in his father, paternal uncle, and sister; Healthy in his sister, son, and son; Heart disease in his mother; Hyperlipidemia in his mother; Hypertension in his mother; Hyperthyroidism in his sister, sister, and sister; Lung cancer in his paternal aunt; Multiple sclerosis in his sister; Pancreatic cancer in his father and paternal uncle; Stomach cancer in his paternal aunt; Stroke in his paternal uncle; Thyroid disease in his mother.    ROS:  Please see the history of present illness.   Otherwise, review of systems are positive for none.   All other systems are reviewed and negative.    PHYSICAL EXAM: VS:  BP 122/78 (BP Location: Left Arm, Patient Position: Sitting, Cuff Size: Normal)   Pulse 78   Ht _0  (1.778 m)   Wt 197 lb (89.4 kg)   SpO2 98%   BMI 28.27 kg/m  , BMI Body mass  index is 28.27 kg/m. GEN: Well nourished, well developed, in no acute distress  HEENT: normal  Neck: no JVD, carotid bruits, or masses Cardiac: RRR; no murmurs, rubs, or gallops,no edema  Respiratory:  clear to auscultation bilaterally, normal work of breathing GI: soft, nontender, nondistended, + BS MS: no deformity or atrophy  Skin: warm and dry, no rash Neuro:  Strength and sensation are intact Psych: euthymic mood, full affect  EKG:  EKG is  ordered today. EKG showed normal sinus rhythm with minimal LVH.  Recent Labs: 09/10/2020: ALT 300; BUN 13; Creatinine, Ser 0.51; Hemoglobin 15.5; Platelets 285; Potassium 4.3; Sodium 138; TSH 2.970    Lipid Panel    Component Value Date/Time   CHOL 135 05/19/2020 0805   CHOL 231 (H) 02/25/2020 1545  TRIG 109 05/19/2020 0805   HDL 48 05/19/2020 0805   HDL 50 02/25/2020 1545   CHOLHDL 2.8 05/19/2020 0805   VLDL 18 10/28/2019 0800   LDLCALC 68 05/19/2020 0805      Wt Readings from Last 3 Encounters:  04/20/21 197 lb (89.4 kg)  04/16/21 193 lb 3.2 oz (87.6 kg)  10/27/20 199 lb 9.6 oz (90.5 kg)        PAD Screen 04/18/2017  Previous PAD dx? No  Previous surgical procedure? No  Pain with walking? No  Feet/toe relief with dangling? No  Painful, non-healing ulcers? No  Extremities discolored? No      ASSESSMENT AND PLAN:  1.  Coronary artery disease involving native coronary arteries without angina: He denies any chest pain.  However, he reports significant exertional dyspnea.  Some of this could be due to physical deconditioning.  I requested an echocardiogram to evaluate LV systolic function.  2.  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  I requested a follow-up lipid profile.  I will reach out to rheumatology to make sure there is no contraindication for other antihyperlipidemia medication such as Zetia or PCSK9 inhibitors.  3. Type 2 diabetes: He is off medications and most recent hemoglobin A1c was  6.   4.  Intermittent palpitations and tachycardia: I requested a 2-week ZIO monitor.    Disposition:   FU after cardiac testing.  Signed,  Kathlyn Sacramento, MD  04/20/2021 3:38 PM    [facility]

## 2021-04-20 NOTE — Patient Instructions (Signed)
Medication Instructions:  Your physician recommends that you continue on your current medications as directed. Please refer to the Current Medication list given to you today.  *If you need a refill on your cardiac medications before your next appointment, please call your pharmacy*   Lab Work: Lipid today  If you have labs (blood work) drawn today and your tests are completely normal, you will receive your results only by: [place] (if you have MyChart) OR A paper copy in the mail If you have any lab test that is abnormal or we need to change your treatment, we will call you to review the results.   Testing/Procedures: Your physician has requested that you have an echocardiogram. Echocardiography is a painless test that uses sound waves to create images of your heart. It provides your doctor with information about the size and shape of your heart and how well your heart's chambers and valves are working. This procedure takes approximately one hour. There are no restrictions for this procedure.  Your physician has recommended that you wear a Zio XT monitor. (To be worn for 14 days)  This monitor is a medical device that records the heart's electrical activity. Doctors most often use these monitors to diagnose arrhythmias. Arrhythmias are problems with the speed or rhythm of the heartbeat. The monitor is a small device applied to your chest. You can wear one while you do your normal daily activities. While wearing this monitor if you have any symptoms to push the button and record what you felt. Once you have worn this monitor for the period of time provider prescribed (Usually 14 days), you will return the monitor device in the postage paid box. Once it is returned they will download the data collected and provide [place] with a report which the provider will then review and we will call you with those results. Important tips:  Avoid showering during the first 24 hours of wearing the  monitor. Avoid excessive sweating to help maximize wear time. Do not submerge the device, no hot tubs, and no swimming pools. Keep any lotions or oils away from the patch. After 24 hours you may shower with the patch on. Take brief showers with your back facing the shower head.  Do not remove patch once it has been placed because that will interrupt data and decrease adhesive wear time. Push the button when you have any symptoms and write down what you were feeling. Once you have completed wearing your monitor, remove and place into box which has postage paid and place in your outgoing mailbox.  If for some reason you have misplaced your box then call our office and we can provide another box and/or mail it off for you.      Follow-Up: At [facility], you and your health needs are our priority.  As part of our continuing mission to provide you with exceptional heart care, we have created designated Provider Care Teams.  These Care Teams include your primary Cardiologist (physician) and Advanced Practice Providers (APPs -  Physician Assistants and Nurse Practitioners) who all work together to provide you with the care you need, when you need it.  We recommend signing up for the patient portal called "MyChart".  Sign up information is provided on this After Visit Summary.  MyChart is used to connect with patients for Virtual Visits (Telemedicine).  Patients are able to view lab/test results, encounter notes, upcoming appointments, etc.  Non-urgent messages can be sent to your provider as well.  To learn more about what you can do with MyChart, go to [email or web address].    Your next appointment:   6 week(s)  The format for your next appointment:   In Person  Provider:   You may see Kathlyn Sacramento, MD or one of the following Advanced Practice Providers on your designated Care Team:   Murray Hodgkins, NP Christell Faith, PA-C Marrianne Mood, PA-C Cadence Kathlen Mody, [place]   Other  Instructions N/A

## 2021-04-21 ENCOUNTER — Telehealth: Payer: Self-pay | Admitting: Family Medicine

## 2021-04-21 DIAGNOSIS — E1169 Type 2 diabetes mellitus with other specified complication: Secondary | ICD-10-CM

## 2021-04-21 LAB — LIPID PANEL
Chol/HDL Ratio: 4.6 ratio (ref 0.0–5.0)
Cholesterol, Total: 270 mg/dL — ABNORMAL HIGH (ref 100–199)
HDL: 59 mg/dL (ref >39)
LDL Chol Calc (NIH): 159 mg/dL — ABNORMAL HIGH (ref 0–99)
Triglycerides: 283 mg/dL — ABNORMAL HIGH (ref 0–149)
VLDL Cholesterol Cal: 52 mg/dL — ABNORMAL HIGH (ref 5–40)

## 2021-04-21 MED ORDER — OMEGA-3-ACID ETHYL ESTERS 1 G PO CAPS: 1.0000 g | ORAL_CAPSULE | Freq: Two times a day (BID) | ORAL | 1 refills | Status: DC

## 2021-04-21 NOTE — Telephone Encounter (Signed)
Signed order for generic now  Nobie Putnam, [facility] 04/21/2021, 3:03 PM

## 2021-04-21 NOTE — Telephone Encounter (Signed)
Stephen [name], from [facility], calling stating that the prescription for Lovaza is not covered by the pts insurance. He states that the insurance is only willing to cover the generic. Please advise.

## 2021-05-10 ENCOUNTER — Other Ambulatory Visit: Payer: Self-pay

## 2021-05-10 ENCOUNTER — Encounter: Payer: Self-pay | Admitting: Family Medicine

## 2021-05-10 DIAGNOSIS — E1129 Type 2 diabetes mellitus with other diabetic kidney complication: Secondary | ICD-10-CM

## 2021-05-10 MED ORDER — MICROLET LANCETS MISC: 4 refills | Status: AC

## 2021-06-04 ENCOUNTER — Ambulatory Visit (INDEPENDENT_AMBULATORY_CARE_PROVIDER_SITE_OTHER): Payer: 59

## 2021-06-04 ENCOUNTER — Other Ambulatory Visit: Payer: Self-pay

## 2021-06-04 DIAGNOSIS — R0602 Shortness of breath: Secondary | ICD-10-CM

## 2021-06-04 LAB — ECHOCARDIOGRAM COMPLETE
AR max vel: 2.21 cm2
AV Area VTI: 2.14 cm2
AV Area mean vel: 2.25 cm2
AV Mean grad: 3 mmHg
AV Peak grad: 6.4 mmHg
Ao pk vel: 1.26 m/s
Area-P 1/2: 4.8 cm2
S' Lateral: 3.1 cm

## 2021-06-04 MED ORDER — PERFLUTREN LIPID MICROSPHERE
1.0000 mL | INTRAVENOUS | Status: AC | PRN
Start: 2021-06-04 — End: 2021-06-04
  Administered 2021-06-04: 2 mL via INTRAVENOUS

## 2021-06-06 NOTE — Progress Notes (Signed)
Cardiology Office Note    Date:  06/07/2021   ID:  Stephen [name], DOB 05/25/1976, MRN [id number]  PCP:  Olin Hauser, DO  Cardiologist:  Kathlyn Sacramento, MD  Electrophysiologist:  None   Chief Complaint: Follow up  History of Present Illness:   Stephen [name] is a 45 y.o. male with history of CAD, DM2, HLD with statin induced myositis, and abnormal liver function who presents for follow up of Zio and echo.    He underwent nuclear stress testing for unstable angina in 2018 that was abnormal. Echo at that time showed an normal EF. Subsequent LHC in 04/2017 showed significant underlying 2-vessel CAD with subacute thrombotic occlusion of the mid to distal RCA with left-to-right collaterals, occluded OM3 with left-to-left collaterals, significant stenosis of a large OM2, and moderate stenosis of the D2. The LAD was mildly and diffusely diseased. EF was normal. LVEDP was high-normal. He was initially managed medically, but had worsening angina and subsequently underwent PCI/DES to the OM2 in 04/2017. The RCA was left to be managed medically given well developed collaterals. He was seen in the office in 02/2020 and was without angina. It was noted he had elevated LFTs on both Lipitor and Crestor and had been managed on Zetia and Vascpea. Follow up LFTs off statins continued to show elevated LFTs and in this setting, Zetia was stopped, he was referred to GI, and he was started on Repatha.  He developed myalgias and was found to have significant elevated CPK at 17,000.  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  He was treated with prednisone and methotrexate.  His muscle strength deteriorated significantly in April and May 2022 and was started on IVIG with subsequent improvement.  He was last seen in the office in 04/2021 and continued to be deconditioned.  He reported exertional dyspnea without chest pain as well as intermittent episodes of tachycardia, particularly after eating.   Given symptoms, he underwent a Zio monitoring which showed a predominant rhythm of sinus with an average rate of 85 bpm (range 50 to 148 bpm), isolated rare PACs and PVCs, and QRS morphology change throughout.  Triggered events did not correlate with significant arrhythmia.  Echo on 06/04/2021 showed an EF of 60 to 65%, no regional wall motion abnormalities, normal LV diastolic function parameters, normal RV systolic function and ventricular cavity size, mild mitral regurgitation, and an estimated right atrial pressure of 8 mmHg.  He comes in today doing reasonably well.  He remains significantly deconditioned, though this is improving.  He will be starting physical therapy soon.  He continues to note elevations in his heart rates into the 120s bpm with mastication.  No angina, dyspnea, palpitations, dizziness, presyncope, or syncope..   Labs independently reviewed: 04/2021 - AST/ALT normal, Hgb 15.1, PLT 188, TC 270, TG 283, HDL 59, LDL 159, A1c 8.1 02/2021 - potassium 3.8, BUN 12, serum creatinine 0.4 08/2020 - TSH normal  Past Medical History:  Diagnosis Date   CAD (coronary artery disease)    a. LHC 04/2017: D2 60%, pLCx 30%, OM2 85%, OM3 100%L-L collats, m-dRCA 100% L-R collats, LVEF 55-65%; b. LHC 05/15/2017: D2 60%, pLCx 30%, OM2-1 85% s/p PCI/DES, OM2-2 30%, OM3 100% L-L collats, m-dRCA 100% L-R collats     Diabetes ([place])    History of echocardiogram    a. 04/2017: EF 55-60%, normal wall motion, normal LV diastolic function, no significant valvular abnormalities   Hyperlipidemia    Kidney stones 2002, 2008  no stone eval   Myocardial infarction [facility])     Past Surgical History:  Procedure Laterality Date   APPENDECTOMY  2000   CARDIAC CATHETERIZATION     CORONARY STENT INTERVENTION N/A 05/15/2017   Procedure: CORONARY STENT INTERVENTION;  Surgeon: Wellington Hampshire, MD;  Location: [place] [facility];  Service: Cardiovascular;  Laterality: N/A;   LEFT HEART CATH AND CORONARY  ANGIOGRAPHY Left 04/24/2017   Procedure: LEFT HEART CATH AND CORONARY ANGIOGRAPHY;  Surgeon: Wellington Hampshire, MD;  Location: [place] [facility];  Service: Cardiovascular;  Laterality: Left;    Current Medications: Current Meds  Medication Sig   acetaminophen (TYLENOL) 500 MG tablet Take by mouth.   aspirin EC 81 MG tablet Take 1 tablet (81 mg total) by mouth daily.   Blood Glucose Monitoring Suppl (FIFTY50 GLUCOSE METER 2.0) w/Device KIT USE 1 TEST STRIP TO CHECK GLUCOSE UP TO TWICE DAILY AS INSTRUCTED   CONTOUR NEXT TEST test strip USE 1 TEST STRIP TO CHECK GLUCOSE UP TO TWICE DAILY AS INSTRUCTED   folic acid (FOLVITE) 1 MG tablet Take 1 mg by mouth daily.   lisinopril (ZESTRIL) 2.5 MG tablet Take 1 tablet (2.5 mg total) by mouth daily.   metFORMIN (GLUCOPHAGE XR) 500 MG 24 hr tablet Take 2 tablets (1,000 mg total) by mouth daily with breakfast.   methotrexate (RHEUMATREX) 2.5 MG tablet Take by mouth once a week.   Microlet Lancets MISC USE 1 LANCET TO CHECK BLOOD SUGAR TWICE DAILY AS DIRECTED   omega-3 acid ethyl esters (LOVAZA) 1 g capsule Take 1 capsule (1 g total) by mouth 2 (two) times daily.   omeprazole (PRILOSEC) 20 MG capsule Take 20 mg by mouth daily.   predniSONE (DELTASONE) 20 MG tablet Take 20 mg by mouth in the morning, at noon, and at bedtime.   [DISCONTINUED] nitroGLYCERIN (NITROSTAT) 0.4 MG SL tablet Place 1 tablet (0.4 mg total) under the tongue every 5 (five) minutes as needed for chest pain.    Allergies:   Patient has no known allergies.   Social History   Socioeconomic History   Marital status: Divorced    Spouse name: Not on file   Number of children: Not on file   Years of education: Not on file   Highest education level: Not on file  Occupational History   Not on file  Tobacco Use   Smoking status: Former    Packs/day: 0.10    Years: 3.00    Pack years: 0.30    Types: Cigarettes    Quit date: 08/15/1994    Years since quitting: 26.8   Smokeless  tobacco: Never   Tobacco comments:    weekend smoker, pt would only smoke bout 1 -2 cig on the weekend.   Vaping Use   Vaping Use: Never used  Substance and Sexual Activity   Alcohol use: Yes    Comment: on week-ends   Drug use: No   Sexual activity: Yes    Comment: preventing w/ partner contraception  Other Topics Concern   Not on file  Social History Narrative   Not on file   Social Determinants of Health   Financial Resource Strain: Not on file  Food Insecurity: Not on file  Transportation Needs: Not on file  Physical Activity: Not on file  Stress: Not on file  Social Connections: Not on file     Family History:  [name] family history includes CAD (age of onset: 39) in his mother; Cancer  in his father; Cholecystitis in his father; Chronic Renal Failure in his mother; Diabetes in his father, paternal uncle, and sister; Healthy in his sister, son, and son; Heart disease in his mother; Hyperlipidemia in his mother; Hypertension in his mother; Hyperthyroidism in his sister, sister, and sister; Lung cancer in his paternal aunt; Multiple sclerosis in his sister; Pancreatic cancer in his father and paternal uncle; Stomach cancer in his paternal aunt; Stroke in his paternal uncle; Thyroid disease in his mother. There is no history of Vision loss, Heart attack, Breast cancer, Colon cancer, Prostate cancer, or Esophageal cancer.  ROS:   Please see HPI.  Otherwise, review of systems are negative.   EKGs/Labs/Other Studies Reviewed:    Studies reviewed were summarized above. The additional studies were reviewed today:  2D echo 04/2017: - Left ventricle: The cavity size was normal. Wall thickness was    normal. Systolic function was normal. The estimated ejection    fraction was in the range of 55% to 60%. Wall motion was normal;    there were no regional wall motion abnormalities. Left    ventricular diastolic function parameters were normal.   Impressions:   - Normal  study.  __________   LHC 04/2017: The left ventricular systolic function is normal. LV end diastolic pressure is mildly elevated. The left ventricular ejection fraction is 55-65% by visual estimate. Mid RCA to Dist RCA lesion, 100 %stenosed. Ost 3rd Mrg to 3rd Mrg lesion, 100 %stenosed. 2nd Mrg lesion, 85 %stenosed. Ost 2nd Diag to 2nd Diag lesion, 60 %stenosed. Prox Cx lesion, 30 %stenosed.   1. Significant underlying three-vessel coronary artery disease. The culprit for abnormal stress test as subacute thrombotic occlusion of the mid to distal right coronary artery which now has left-to-right collaterals. OM 3 is also occluded with left to left collaterals. There is significant stenosis in OM 2 and moderate disease in second diagonal. The LAD is mildly and diffusely diseased. 2. Normal LV systolic function. High normal left ventricular end-diastolic pressure.   Recommendations: Recommend initial medical therapy. PCI of OM 2 can be considered for refractory angina. [name] LAD does not have significant obstructive disease to warrant CABG. __________   LHC 05/2017: Mid RCA to Dist RCA lesion, 100 %stenosed. Ost 3rd Mrg to 3rd Mrg lesion, 100 %stenosed. Ost 2nd Diag to 2nd Diag lesion, 60 %stenosed. Prox Cx lesion, 30 %stenosed. 2nd Mrg-2 lesion, 30 %stenosed. 2nd Mrg-1 lesion, 85 %stenosed. Post intervention, there is a 0% residual stenosis. A stent was successfully placed.   Successful angioplasty and drug-eluting stent placement to large OM 2.   Recommendations: Dual antiplatelet therapy for at least 6 months. Aggressive treatment of risk factors. I added small dose Toprol. __________  Elwyn Reach 04/2021: Patient had a min HR of 50 bpm, max HR of 148 bpm, and avg HR of 85 bpm.  Predominant underlying rhythm was Sinus Rhythm. QRS morphology changes were present throughout recording. Isolated  Rare PACs and rare PVCs. Triggered events did not correlate with  arrhythmia. __________  2D echo 06/04/2021: 1. Left ventricular ejection fraction, by estimation, is 60 to 65%. The  left ventricle has normal function. The left ventricle has no regional  wall motion abnormalities. Left ventricular diastolic parameters were  normal.   2. Right ventricular systolic function is normal. The right ventricular  size is normal.   3. The mitral valve is normal in structure. Mild mitral valve  regurgitation. No evidence of mitral stenosis.   4. The aortic valve was  not well visualized. Aortic valve regurgitation  is not visualized. No aortic stenosis is present.   5. The inferior vena cava is normal in size with <50% respiratory  variability, suggesting right atrial pressure of 8 mmHg.   EKG:  EKG is not ordered today given recent Zio patch.    Recent Labs: 09/10/2020: ALT 300; BUN 13; Creatinine, Ser 0.51; Hemoglobin 15.5; Platelets 285; Potassium 4.3; Sodium 138; TSH 2.970  Recent Lipid Panel    Component Value Date/Time   CHOL 270 (H) 04/20/2021 1545   TRIG 283 (H) 04/20/2021 1545   HDL 59 04/20/2021 1545   CHOLHDL 4.6 04/20/2021 1545   CHOLHDL 2.8 05/19/2020 0805   VLDL 18 10/28/2019 0800   LDLCALC 159 (H) 04/20/2021 1545   LDLCALC 68 05/19/2020 0805    PHYSICAL EXAM:    VS:  BP 114/90 (BP Location: Left Arm, Patient Position: Sitting, Cuff Size: Normal)   Pulse 82   Ht '5\' 10"'  (1.778 m)   Wt 209 lb 6 oz (95 kg)   SpO2 98%   BMI 30.04 kg/m   BMI: Body mass index is 30.04 kg/m.  Physical Exam Vitals reviewed.  Constitutional:      Appearance: He is well-developed.  HENT:     Head: Normocephalic and atraumatic.  Eyes:     General:        Right eye: No discharge.        Left eye: No discharge.  Neck:     Vascular: No JVD.  Cardiovascular:     Rate and Rhythm: Normal rate and regular rhythm.     Heart sounds: Normal heart sounds, S1 normal and S2 normal. Heart sounds not distant. No midsystolic click and no opening snap. No murmur  heard.   No friction rub.  Pulmonary:     Effort: Pulmonary effort is normal. No respiratory distress.     Breath sounds: Normal breath sounds. No decreased breath sounds, wheezing or rales.  Chest:     Chest wall: No tenderness.  Abdominal:     General: There is no distension.     Palpations: Abdomen is soft.     Tenderness: There is no abdominal tenderness.  Musculoskeletal:     Cervical back: Normal range of motion.     Right lower leg: No edema.     Left lower leg: No edema.  Skin:    General: Skin is warm and dry.     Nails: There is no clubbing.  Neurological:     Mental Status: He is alert and oriented to person, place, and time.  Psychiatric:        Speech: Speech normal.        Behavior: Behavior normal.        Thought Content: Thought content normal.        Judgment: Judgment normal.    Wt Readings from Last 3 Encounters:  06/07/21 209 lb 6 oz (95 kg)  04/20/21 197 lb (89.4 kg)  04/16/21 193 lb 3.2 oz (87.6 kg)     ASSESSMENT & PLAN:   CAD involving the native coronary arteries without angina: He is doing well without any symptoms concerning for angina.  Echo demonstrated preserved LV systolic function with normal wall motion.  Exertional dyspnea is likely in the setting of underlying physical deconditioning secondary to the below.  He remains on aspirin and lisinopril.  Refill SL NTG.  No plans for ischemic testing at this time.  Sinus tachycardia: Zio patch demonstrated a  predominant rhythm of sinus with rare PACs and PVCs.  He was noted to have elevations in heart rates into the 1 teens to 130s that correlate with evening meals and mastication.  I do wonder if this sinus tachycardia is presenting in the setting of underlying muscular physical deconditioning, and with continued PT/improvement in symptoms, will improve.  Defer addition of medications at this time.  HLD with statin induced myositis: Continue to avoid statins.  Patient indicates his rheumatologist has  recommended deferring additional medications at this time, which seems to be quite appropriate.  We will need to revisit the possibility of alternative lipid therapies down the road.  Perhaps we can enlist the assistance from the lipid clinic at that time.  DM2: A1c 8.1 in 04/2021.  A1c has been elevated with chronic steroid use.  Follow-up with PCP as directed.  Disposition: F/u with Dr. Fletcher Anon or an APP in 4 months.   Medication Adjustments/Labs and Tests Ordered: Current medicines are reviewed at length with the patient today.  Concerns regarding medicines are outlined above. Medication changes, Labs and Tests ordered today are summarized above and listed in the Patient Instructions accessible in Encounters.   Signed, Christell Faith, PA-C 06/07/2021 4:02 PM     [address] [place], [place] [id number] [phone]

## 2021-06-07 ENCOUNTER — Ambulatory Visit (INDEPENDENT_AMBULATORY_CARE_PROVIDER_SITE_OTHER): Payer: 59 | Admitting: Physician Assistant

## 2021-06-07 ENCOUNTER — Encounter: Payer: Self-pay | Admitting: Physician Assistant

## 2021-06-07 ENCOUNTER — Other Ambulatory Visit: Payer: Self-pay

## 2021-06-07 VITALS — BP 114/90 | HR 82 | Ht 70.0 in | Wt 209.4 lb

## 2021-06-07 DIAGNOSIS — E1129 Type 2 diabetes mellitus with other diabetic kidney complication: Secondary | ICD-10-CM

## 2021-06-07 DIAGNOSIS — I2510 Atherosclerotic heart disease of native coronary artery without angina pectoris: Secondary | ICD-10-CM | POA: Diagnosis not present

## 2021-06-07 DIAGNOSIS — T466X5A Adverse effect of antihyperlipidemic and antiarteriosclerotic drugs, initial encounter: Secondary | ICD-10-CM

## 2021-06-07 DIAGNOSIS — R000 Tachycardia, unspecified: Secondary | ICD-10-CM | POA: Diagnosis not present

## 2021-06-07 DIAGNOSIS — E785 Hyperlipidemia, unspecified: Secondary | ICD-10-CM

## 2021-06-07 DIAGNOSIS — R809 Proteinuria, unspecified: Secondary | ICD-10-CM

## 2021-06-07 DIAGNOSIS — M609 Myositis, unspecified: Secondary | ICD-10-CM

## 2021-06-07 MED ORDER — NITROGLYCERIN 0.4 MG SL SUBL: 0.4000 mg | SUBLINGUAL_TABLET | SUBLINGUAL | 1 refills | Status: AC | PRN

## 2021-06-07 NOTE — Patient Instructions (Signed)
Medication Instructions:  No changes at this time.   *If you need a refill on your cardiac medications before your next appointment, please call your pharmacy*   Lab Work: None  If you have labs (blood work) drawn today and your tests are completely normal, you will receive your results only by: [place] (if you have MyChart) OR A paper copy in the mail If you have any lab test that is abnormal or we need to change your treatment, we will call you to review the results.   Testing/Procedures: None   Follow-Up: At [facility], you and your health needs are our priority.  As part of our continuing mission to provide you with exceptional heart care, we have created designated Provider Care Teams.  These Care Teams include your primary Cardiologist (physician) and Advanced Practice Providers (APPs -  Physician Assistants and Nurse Practitioners) who all work together to provide you with the care you need, when you need it.   Your next appointment:   4 month(s)  The format for your next appointment:   In Person  Provider:   You may see Kathlyn Sacramento, MD or one of the following Advanced Practice Providers on your designated Care Team:   Murray Hodgkins, NP Christell Faith, PA-C Marrianne Mood, PA-C Cadence [place], [place]

## 2021-07-07 ENCOUNTER — Other Ambulatory Visit: Payer: Self-pay | Admitting: Family Medicine

## 2021-07-07 DIAGNOSIS — E1129 Type 2 diabetes mellitus with other diabetic kidney complication: Secondary | ICD-10-CM

## 2021-07-07 NOTE — Telephone Encounter (Signed)
Requested Prescriptions  Pending Prescriptions Disp Refills  . CONTOUR NEXT TEST test strip [Pharmacy Med Name: Contour Next Test In Vitro Strip] 200 each 0    Sig: USE 1 STRIP TO CHECK GLUCOSE UP TO TWICE DAILY AS DIRECTED     Endocrinology: Diabetes - Testing Supplies Passed - 07/07/2021  4:03 PM      Passed - Valid encounter within last 12 months    Recent Outpatient Visits          2 months ago Type 2 diabetes mellitus with microalbuminuria, without long-term current use of insulin ([place])   [place], DO   8 months ago Type 2 diabetes mellitus with microalbuminuria, without long-term current use of insulin [facility])   [facility] Olin Hauser, DO   9 months ago [place], DO   1 year ago Annual physical exam   [facility] Olin Hauser, DO   1 year ago Type 2 diabetes mellitus with microalbuminuria, without long-term current use of insulin [facility])   [facility] [place], Devonne Doughty, DO      Future Appointments            In 2 weeks [place], Devonne Doughty, DO [facility], [place]   In 2 months [place], Areta Haber, PA-C [facility], [place]

## 2021-07-19 ENCOUNTER — Telehealth: Payer: Self-pay | Admitting: *Deleted

## 2021-07-19 NOTE — Telephone Encounter (Signed)
PA sent to Covermymeds for Repatha. Awaiting a decision.   Hence Stephen [name] (Key: [id number])  [employer] is processing your PA request and will respond shortly with next steps. You may close this dialog, return to your dashboard, and perform other tasks. To check for an update later, open this request again from your dashboard.  If you need assistance, please chat with CoverMyMeds or call us at [phone].

## 2021-07-19 NOTE — Telephone Encounter (Signed)
However after reviewing patient's chart, it appears Repatha was discontinued by PCP. Called to verify with patient if he is taking the medication but no answer, left message for patient to return call before completing questionnaire for PA.

## 2021-07-23 ENCOUNTER — Ambulatory Visit: Payer: 59 | Admitting: Family Medicine

## 2021-09-25 NOTE — Progress Notes (Signed)
Cardiology Office Note    Date:  09/27/2021   ID:  Stephen [name], DOB 03-18-1976, MRN [id number]  PCP:  Olin Hauser, DO  Cardiologist:  Kathlyn Sacramento, MD  Electrophysiologist:  None   Chief Complaint: Follow-up  History of Present Illness:   Stephen [name] is a 46 y.o. male with history of CAD, DM2, HLD with statin induced myositis, and abnormal liver function who presents for follow up of CAD.    He underwent nuclear stress testing for unstable angina in 2018 that was abnormal. Echo at that time showed an normal EF. Subsequent LHC in 04/2017 showed significant underlying 2-vessel CAD with subacute thrombotic occlusion of the mid to distal RCA with left-to-right collaterals, occluded OM3 with left-to-left collaterals, significant stenosis of a large OM2, and moderate stenosis of the D2. The LAD was mildly and diffusely diseased. EF was normal. LVEDP was high-normal. He was initially managed medically, but had worsening angina and subsequently underwent PCI/DES to the OM2 in 04/2017. The RCA was left to be managed medically given well developed collaterals. He was seen in the office in 02/2020 and was without angina. It was noted he had elevated LFTs on both Lipitor and Crestor and had been managed on Zetia and Vascpea. Follow up LFTs off statins continued to show elevated LFTs and in this setting, Zetia was stopped, he was referred to GI, and he was started on Repatha.  He developed myalgias and was found to have significant elevated CPK at 17,000.  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  He was treated with prednisone and methotrexate.  His muscle strength deteriorated significantly in April and May 2022 and was started on IVIG with subsequent improvement.  He was seen in the office in 04/2021 and continued to be deconditioned.  He reported exertional dyspnea without chest pain as well as intermittent episodes of tachycardia, particularly after eating.  Given  symptoms, he underwent a Zio monitoring which showed a predominant rhythm of sinus with an average rate of 85 bpm (range 50 to 148 bpm), isolated rare PACs and PVCs, and QRS morphology change throughout.  Triggered events did not correlate with significant arrhythmia.  Echo on 06/04/2021 showed an EF of 60 to 65%, no regional wall motion abnormalities, normal LV diastolic function parameters, normal RV systolic function and ventricular cavity size, mild mitral regurgitation, and an estimated right atrial pressure of 8 mmHg.  He was last seen in the office on 06/03/2021 and was doing well from a cardiac perspective.  He continued to be significantly deconditioned, though this was improving.  He did note heart rates into the 120s when chewing food.  Otherwise, he was without complaints.  Additional medications were deferred as he continued to progress from his myositis.  He comes in doing well from a cardiac perspective and is without symptoms of angina or decompensation.  No dizziness, presyncope, or syncope.  He does feel like his strength improved.  He does feel like he needs to continue to work on his conditioning.  Rheumatology has tapered his prednisone down to 5 mg daily.  He does continue to note some tachycardic rates into the 120s with mastication.  He has completed therapy with IVIG.  He follows up with rheumatology next month.  He does wonder when/if he will be able to restart Repatha.   Labs independently reviewed: 08/2021 - Hgb 14.5, PLT 253, AST/ALT normal 04/2021 - TC 270, TG 283, HDL 59, LDL 159, A1c 8.1 02/2021 -  potassium 3.8, BUN 12, serum creatinine 0.4 08/2020 - TSH normal  Past Medical History:  Diagnosis Date   CAD (coronary artery disease)    a. LHC 04/2017: D2 60%, pLCx 30%, OM2 85%, OM3 100%L-L collats, m-dRCA 100% L-R collats, LVEF 55-65%; b. LHC 05/15/2017: D2 60%, pLCx 30%, OM2-1 85% s/p PCI/DES, OM2-2 30%, OM3 100% L-L collats, m-dRCA 100% L-R collats     Diabetes ([place])     History of echocardiogram    a. 04/2017: EF 55-60%, normal wall motion, normal LV diastolic function, no significant valvular abnormalities   Hyperlipidemia    Kidney stones 2002, 2008   no stone eval   Myocardial infarction [facility])     Past Surgical History:  Procedure Laterality Date   APPENDECTOMY  2000   CARDIAC CATHETERIZATION     CORONARY STENT INTERVENTION N/A 05/15/2017   Procedure: CORONARY STENT INTERVENTION;  Surgeon: Wellington Hampshire, MD;  Location: [place] [facility];  Service: Cardiovascular;  Laterality: N/A;   LEFT HEART CATH AND CORONARY ANGIOGRAPHY Left 04/24/2017   Procedure: LEFT HEART CATH AND CORONARY ANGIOGRAPHY;  Surgeon: Wellington Hampshire, MD;  Location: [place] [facility];  Service: Cardiovascular;  Laterality: Left;    Current Medications: Current Meds  Medication Sig   acetaminophen (TYLENOL) 500 MG tablet Take by mouth as needed.   aspirin EC 81 MG tablet Take 1 tablet (81 mg total) by mouth daily.   Blood Glucose Monitoring Suppl (FIFTY50 GLUCOSE METER 2.0) w/Device KIT USE 1 TEST STRIP TO CHECK GLUCOSE UP TO TWICE DAILY AS INSTRUCTED   CONTOUR NEXT TEST test strip USE 1 STRIP TO CHECK GLUCOSE UP TO TWICE DAILY AS DIRECTED   folic acid (FOLVITE) 1 MG tablet Take 1 mg by mouth daily.   lisinopril (ZESTRIL) 2.5 MG tablet Take 1 tablet (2.5 mg total) by mouth daily.   metFORMIN (GLUCOPHAGE-XR) 500 MG 24 hr tablet TAKE 2 TABLETS BY MOUTH ONCE DAILY WITH BREAKFAST   methotrexate (RHEUMATREX) 2.5 MG tablet Take by mouth once a week.   Microlet Lancets MISC USE 1 LANCET TO CHECK BLOOD SUGAR TWICE DAILY AS DIRECTED   nitroGLYCERIN (NITROSTAT) 0.4 MG SL tablet Place 1 tablet (0.4 mg total) under the tongue every 5 (five) minutes as needed for chest pain.   omega-3 acid ethyl esters (LOVAZA) 1 g capsule Take 1 capsule (1 g total) by mouth 2 (two) times daily.   omeprazole (PRILOSEC) 20 MG capsule Take 1 capsule (20 mg total) by mouth daily before breakfast.    predniSONE (DELTASONE) 5 MG tablet Take 1 tablet (5 mg total) by mouth daily with breakfast.    Allergies:   Patient has no known allergies.   Social History   Socioeconomic History   Marital status: Divorced    Spouse name: Not on file   Number of children: Not on file   Years of education: Not on file   Highest education level: Not on file  Occupational History   Not on file  Tobacco Use   Smoking status: Former    Packs/day: 0.10    Years: 3.00    Pack years: 0.30    Types: Cigarettes    Quit date: 08/15/1994    Years since quitting: 27.1   Smokeless tobacco: Never   Tobacco comments:    weekend smoker, pt would only smoke bout 1 -2 cig on the weekend.   Vaping Use   Vaping Use: Never used  Substance and Sexual Activity   Alcohol  use: Yes    Comment: on week-ends   Drug use: No   Sexual activity: Yes    Comment: preventing w/ partner contraception  Other Topics Concern   Not on file  Social History Narrative   Not on file   Social Determinants of Health   Financial Resource Strain: Not on file  Food Insecurity: Not on file  Transportation Needs: Not on file  Physical Activity: Not on file  Stress: Not on file  Social Connections: Not on file     Family History:  [name] family history includes CAD (age of onset: 32) in his mother; Cancer in his father; Cholecystitis in his father; Chronic Renal Failure in his mother; Diabetes in his father, paternal uncle, and sister; Healthy in his sister, son, and son; Heart disease in his mother; Hyperlipidemia in his mother; Hypertension in his mother; Hyperthyroidism in his sister, sister, and sister; Lung cancer in his paternal aunt; Multiple sclerosis in his sister; Pancreatic cancer in his father and paternal uncle; Stomach cancer in his paternal aunt; Stroke in his paternal uncle; Thyroid disease in his mother. There is no history of Vision loss, Heart attack, Breast cancer, Colon cancer, Prostate cancer, or  Esophageal cancer.  ROS:   12-point review of systems is negative unless otherwise noted in the HPI.   EKGs/Labs/Other Studies Reviewed:    Studies reviewed were summarized above. The additional studies were reviewed today:  2D echo 04/2017: - Left ventricle: The cavity size was normal. Wall thickness was    normal. Systolic function was normal. The estimated ejection    fraction was in the range of 55% to 60%. Wall motion was normal;    there were no regional wall motion abnormalities. Left    ventricular diastolic function parameters were normal.   Impressions:   - Normal study.  __________   LHC 04/2017: The left ventricular systolic function is normal. LV end diastolic pressure is mildly elevated. The left ventricular ejection fraction is 55-65% by visual estimate. Mid RCA to Dist RCA lesion, 100 %stenosed. Ost 3rd Mrg to 3rd Mrg lesion, 100 %stenosed. 2nd Mrg lesion, 85 %stenosed. Ost 2nd Diag to 2nd Diag lesion, 60 %stenosed. Prox Cx lesion, 30 %stenosed.   1. Significant underlying three-vessel coronary artery disease. The culprit for abnormal stress test as subacute thrombotic occlusion of the mid to distal right coronary artery which now has left-to-right collaterals. OM 3 is also occluded with left to left collaterals. There is significant stenosis in OM 2 and moderate disease in second diagonal. The LAD is mildly and diffusely diseased. 2. Normal LV systolic function. High normal left ventricular end-diastolic pressure.   Recommendations: Recommend initial medical therapy. PCI of OM 2 can be considered for refractory angina. [name] LAD does not have significant obstructive disease to warrant CABG. __________   LHC 05/2017: Mid RCA to Dist RCA lesion, 100 %stenosed. Ost 3rd Mrg to 3rd Mrg lesion, 100 %stenosed. Ost 2nd Diag to 2nd Diag lesion, 60 %stenosed. Prox Cx lesion, 30 %stenosed. 2nd Mrg-2 lesion, 30 %stenosed. 2nd Mrg-1 lesion, 85 %stenosed. Post  intervention, there is a 0% residual stenosis. A stent was successfully placed.   Successful angioplasty and drug-eluting stent placement to large OM 2.   Recommendations: Dual antiplatelet therapy for at least 6 months. Aggressive treatment of risk factors. I added small dose Toprol. __________   Elwyn Reach 04/2021: Patient had a min HR of 50 bpm, max HR of 148 bpm, and avg HR of 85 bpm.  Predominant underlying rhythm was Sinus Rhythm. QRS morphology changes were present throughout recording. Isolated  Rare PACs and rare PVCs. Triggered events did not correlate with arrhythmia. __________   2D echo 06/04/2021: 1. Left ventricular ejection fraction, by estimation, is 60 to 65%. The  left ventricle has normal function. The left ventricle has no regional  wall motion abnormalities. Left ventricular diastolic parameters were  normal.   2. Right ventricular systolic function is normal. The right ventricular  size is normal.   3. The mitral valve is normal in structure. Mild mitral valve  regurgitation. No evidence of mitral stenosis.   4. The aortic valve was not well visualized. Aortic valve regurgitation  is not visualized. No aortic stenosis is present.   5. The inferior vena cava is normal in size with <50% respiratory  variability, suggesting right atrial pressure of 8 mmHg.    EKG:  EKG is ordered today.  The EKG ordered today demonstrates NSR, 83 bpm, LVH with early repolarization abnormality, poor R wave progression along the precordial leads, no significant change when compared to prior tracing  Recent Labs: No results found for requested labs within last 8760 hours.  Recent Lipid Panel    Component Value Date/Time   CHOL 270 (H) 04/20/2021 1545   TRIG 283 (H) 04/20/2021 1545   HDL 59 04/20/2021 1545   CHOLHDL 4.6 04/20/2021 1545   CHOLHDL 2.8 05/19/2020 0805   VLDL 18 10/28/2019 0800   LDLCALC 159 (H) 04/20/2021 1545   LDLCALC 68 05/19/2020 0805    PHYSICAL EXAM:     VS:  BP 126/86 (BP Location: Left Arm, Patient Position: Sitting, Cuff Size: Large)    Pulse 83    Ht 5' 10" (1.778 m)    Wt 219 lb (99.3 kg)    SpO2 97%    BMI 31.42 kg/m   BMI: Body mass index is 31.42 kg/m.  Physical Exam Vitals reviewed.  Constitutional:      Appearance: He is well-developed.  HENT:     Head: Normocephalic and atraumatic.  Eyes:     General:        Right eye: No discharge.        Left eye: No discharge.  Neck:     Vascular: No JVD.  Cardiovascular:     Rate and Rhythm: Normal rate and regular rhythm.     Pulses:          Posterior tibial pulses are 2+ on the right side and 2+ on the left side.     Heart sounds: Normal heart sounds, S1 normal and S2 normal. Heart sounds not distant. No midsystolic click and no opening snap. No murmur heard.   No friction rub.  Pulmonary:     Effort: Pulmonary effort is normal. No respiratory distress.     Breath sounds: Normal breath sounds. No decreased breath sounds, wheezing or rales.  Chest:     Chest wall: No tenderness.  Abdominal:     General: There is no distension.     Palpations: Abdomen is soft.     Tenderness: There is no abdominal tenderness.  Musculoskeletal:     Cervical back: Normal range of motion.     Right lower leg: No edema.     Left lower leg: No edema.  Skin:    General: Skin is warm and dry.     Nails: There is no clubbing.  Neurological:     Mental Status: He is alert and oriented to person,  place, and time.  Psychiatric:        Speech: Speech normal.        Behavior: Behavior normal.        Thought Content: Thought content normal.        Judgment: Judgment normal.    Wt Readings from Last 3 Encounters:  09/27/21 219 lb (99.3 kg)  09/27/21 218 lb (98.9 kg)  06/07/21 209 lb 6 oz (95 kg)     ASSESSMENT & PLAN:   CAD involving the native coronary arteries without angina: He is doing well without symptoms concerning for angina.  Exertional dyspnea has improved with improvement in his  strength.  He remains on aspirin and lisinopril.  No indication for ischemic testing at this time.  Sinus tachycardia: Likely in the setting of physical deconditioning secondary to his polymyositis.  Continue to work on conditioning.  HLD with statin induced myositis: Continue to avoid statins.  We will reach out to his rheumatologist to ask if they have a timeline for possible reinitiation of PCSK9 inhibitor.  We also may need to enlist the assistance from our lipid clinic.  DM2: A1c 8.1 in 04/2021.  This has been elevated with chronic steroid use.  Follow-up with PCP.   Disposition: F/u with Dr. Fletcher Anon or an APP in 6 months.   Medication Adjustments/Labs and Tests Ordered: Current medicines are reviewed at length with the patient today.  Concerns regarding medicines are outlined above. Medication changes, Labs and Tests ordered today are summarized above and listed in the Patient Instructions accessible in Encounters.   Signed, Christell Faith, PA-C 09/27/2021 4:26 PM     [facility] [address] [place], [place] [address] [phone]

## 2021-09-26 ENCOUNTER — Other Ambulatory Visit: Payer: Self-pay | Admitting: Family Medicine

## 2021-09-26 DIAGNOSIS — E1129 Type 2 diabetes mellitus with other diabetic kidney complication: Secondary | ICD-10-CM

## 2021-09-26 DIAGNOSIS — R809 Proteinuria, unspecified: Secondary | ICD-10-CM

## 2021-09-26 DIAGNOSIS — K219 Gastro-esophageal reflux disease without esophagitis: Secondary | ICD-10-CM

## 2021-09-27 ENCOUNTER — Encounter: Payer: Self-pay | Admitting: Physician Assistant

## 2021-09-27 ENCOUNTER — Other Ambulatory Visit: Payer: Self-pay

## 2021-09-27 ENCOUNTER — Ambulatory Visit: Payer: 59 | Admitting: Physician Assistant

## 2021-09-27 ENCOUNTER — Encounter: Payer: Self-pay | Admitting: Family Medicine

## 2021-09-27 ENCOUNTER — Ambulatory Visit: Payer: 59 | Admitting: Family Medicine

## 2021-09-27 VITALS — BP 123/86 | HR 88 | Ht 70.0 in | Wt 218.0 lb

## 2021-09-27 VITALS — BP 126/86 | HR 83 | Ht 70.0 in | Wt 219.0 lb

## 2021-09-27 DIAGNOSIS — M3322 Polymyositis with myopathy: Secondary | ICD-10-CM

## 2021-09-27 DIAGNOSIS — E1129 Type 2 diabetes mellitus with other diabetic kidney complication: Secondary | ICD-10-CM

## 2021-09-27 DIAGNOSIS — M609 Myositis, unspecified: Secondary | ICD-10-CM | POA: Diagnosis not present

## 2021-09-27 DIAGNOSIS — R000 Tachycardia, unspecified: Secondary | ICD-10-CM

## 2021-09-27 DIAGNOSIS — I2510 Atherosclerotic heart disease of native coronary artery without angina pectoris: Secondary | ICD-10-CM | POA: Diagnosis not present

## 2021-09-27 DIAGNOSIS — R809 Proteinuria, unspecified: Secondary | ICD-10-CM | POA: Diagnosis not present

## 2021-09-27 DIAGNOSIS — E785 Hyperlipidemia, unspecified: Secondary | ICD-10-CM | POA: Diagnosis not present

## 2021-09-27 DIAGNOSIS — T466X5A Adverse effect of antihyperlipidemic and antiarteriosclerotic drugs, initial encounter: Secondary | ICD-10-CM

## 2021-09-27 DIAGNOSIS — T466X5D Adverse effect of antihyperlipidemic and antiarteriosclerotic drugs, subsequent encounter: Secondary | ICD-10-CM

## 2021-09-27 LAB — POCT GLYCOSYLATED HEMOGLOBIN (HGB A1C): Hemoglobin A1C: 7.6 % — AB (ref 4.0–5.6)

## 2021-09-27 MED ORDER — OMEPRAZOLE 20 MG PO CPDR: 20.0000 mg | DELAYED_RELEASE_CAPSULE | Freq: Every day | ORAL | 3 refills | Status: AC

## 2021-09-27 NOTE — Patient Instructions (Signed)
Medication Instructions:  No changes at this time.  *If you need a refill on your cardiac medications before your next appointment, please call your pharmacy*   Lab Work: None  If you have labs (blood work) drawn today and your tests are completely normal, you will receive your results only by: [place] (if you have MyChart) OR A paper copy in the mail If you have any lab test that is abnormal or we need to change your treatment, we will call you to review the results.   Testing/Procedures: None   Follow-Up: At [facility], you and your health needs are our priority.  As part of our continuing mission to provide you with exceptional heart care, we have created designated Provider Care Teams.  These Care Teams include your primary Cardiologist (physician) and Advanced Practice Providers (APPs -  Physician Assistants and Nurse Practitioners) who all work together to provide you with the care you need, when you need it.   Your next appointment:   6 month(s)  The format for your next appointment:   In Person  Provider:   Kathlyn Sacramento, MD or Christell Faith, PA-C

## 2021-09-27 NOTE — Progress Notes (Signed)
Subjective:    Patient ID: Stephen [name], male    DOB: [birth year], 46 y.o.   MRN: [id number]  Stephen [name] is a 46 y.o. male presenting on 09/27/2021 for Diabetes   HPI  CHRONIC DM, Type 2 / Obesity BMI >31 Prior A1c 6 range, last 05/2020 was 6.2 CBGs: Avg < 140 range on prednisone now, checks CBG daily - His prednisone was lowered from 10 to 5mg  recently has next apt in March 2023 for further dose adjustment Meds: Metformin XR 500mg  x 2 = 1000mg  daily Currently on ACEi Lifestyle: - Diet off alcohol, improved diet - Exercise - unable to resume due to muscle problem - fam history diabetes Denies hypoglycemia, polyuria, visual changes, numbness or tingling.  HYPERLIPIDEMIA / Elevated LFTs / CAD PMH MI x 3 in 2018, followed by Dr Fletcher Anon Cardiology - Reports concerns with hyperlipidemia / fatty liver history - Taken off Statin therapy and started on Repatha PCKS9 inhib in 02/2020 Remains off Statin, Zetia, Repatha per Rheum   Polymyositis with myopathy Followed by [facility] Rheumatology Dr Posey Pronto Improved on immune suppression therapy On reduced Prednisone 5mg  daily 4 more weeks    Depression screen [facility] 2/9 09/27/2021 04/16/2021 05/26/2020  Decreased Interest 0 0 0  Down, Depressed, Hopeless 0 0 0  PHQ - 2 Score 0 0 0  Altered sleeping 0 0 -  Tired, decreased energy 0 1 -  Change in appetite 0 0 -  Feeling bad or failure about yourself  0 0 -  Trouble concentrating 0 0 -  Moving slowly or fidgety/restless 0 0 -  Suicidal thoughts 0 0 -  PHQ-9 Score 0 1 -  Difficult doing work/chores Not difficult at all Not difficult at all -    Social History   Tobacco Use   Smoking status: Former    Packs/day: 0.10    Years: 3.00    Pack years: 0.30    Types: Cigarettes    Quit date: 08/15/1994    Years since quitting: 27.1   Smokeless tobacco: Never   Tobacco comments:    weekend smoker, pt would only smoke bout 1 -2 cig on the weekend.   Vaping Use   Vaping Use: Never used  Substance  Use Topics   Alcohol use: Yes    Comment: on week-ends   Drug use: No    Review of Systems Per HPI unless specifically indicated above     Objective:    BP 123/86    Pulse 88    Ht 5\' 10"  (1.778 m)    Wt 218 lb (98.9 kg)    SpO2 98%    BMI 31.28 kg/m   Wt Readings from Last 3 Encounters:  09/27/21 219 lb (99.3 kg)  09/27/21 218 lb (98.9 kg)  06/07/21 209 lb 6 oz (95 kg)    Physical Exam Vitals and nursing note reviewed.  Constitutional:      General: He is not in acute distress.    Appearance: Normal appearance. He is well-developed. He is not diaphoretic.     Comments: Well-appearing, comfortable, cooperative  HENT:     Head: Normocephalic and atraumatic.  Eyes:     General:        Right eye: No discharge.        Left eye: No discharge.     Conjunctiva/sclera: Conjunctivae normal.  Cardiovascular:     Rate and Rhythm: Normal rate.  Pulmonary:     Effort: Pulmonary effort is normal.  Skin:  General: Skin is warm and dry.     Findings: No erythema or rash.  Neurological:     Mental Status: He is alert and oriented to person, place, and time.  Psychiatric:        Mood and Affect: Mood normal.        Behavior: Behavior normal.        Thought Content: Thought content normal.     Comments: Well groomed, good eye contact, normal speech and thoughts   Results for orders placed or performed in visit on 09/27/21  POCT HgB A1C  Result Value Ref Range   Hemoglobin A1C 7.6 (A) 4.0 - 5.6 %      Assessment & Plan:   Problem List Items Addressed This Visit     Type 2 diabetes mellitus with microalbuminuria, without long-term current use of insulin (HCC) - Primary    A1c stable 7.6 polymyositis illness Complications - hyperlipidemia, CAD - increases risk of future cardiovascular complications  Still tapering Prednisone  Plan:  1. Continue Metformin  Continue current diet controlled / lifestyle regimen 2. Encourage improved lifestyle - low carb, low sugar diet,  reduce portion size, continue improving regular exercise 3. Check CBG, bring log to next visit for review 4. Continue ASA, ACEi - Off Repatha for now.  Future consider GLP1 agent for cardioprotective benefits      Relevant Orders   POCT HgB A1C (Completed)   Polymyositis with myopathy ([place])    Managed by Rheumatology On prednisone      Relevant Medications   predniSONE (DELTASONE) 5 MG tablet      No orders of the defined types were placed in this encounter.    Follow up plan: Return in about 6 months (around 03/27/2022) for 6 month Annual Physical fasting lab AFTER.   Nobie Putnam, [facility] 09/27/2021, 9:44 AM

## 2021-09-27 NOTE — Patient Instructions (Addendum)
Thank you for coming to the office today.  Goal to come off of Prednisone hopefully as you stated within 4 weeks +  Eventually my goal for you would be to get back on the PCSK9 inhibitor injection Repatha, as I do not believe this was causing or involved in any of your muscular symptoms.  I would recommend further discussion between Rheumatology and Cardiology, to figure out when it is safe / clear to proceed back on the Repatha injection as this should be safe for you.  Recent Labs    04/16/21 0916 09/27/21 0945  HGBA1C 8.1* 7.6*   I do believe that if/when the prednisone 5mg  is discontinued eventually you will hopefully be in correct range < 7% or avg 120 CBG in future. If still not quite optimal we can adjust dosing on Metformin or other plan.  Please schedule a Follow-up Appointment to: Return in about 6 months (around 03/27/2022) for 6 month Annual Physical fasting lab AFTER.  If you have any other questions or concerns, please feel free to call the office or send a message through [place]. You may also schedule an earlier appointment if necessary.  Additionally, you may be receiving a survey about your experience at our office within a few days to 1 week by e-mail or mail. We value your feedback.  Nobie Putnam, DO [place]

## 2021-09-28 ENCOUNTER — Telehealth: Payer: Self-pay | Admitting: *Deleted

## 2021-09-28 NOTE — Telephone Encounter (Signed)
Office visit note routed via [place] Fax over to Dr. Salvatore Marvel Patel's office with provider request.

## 2021-09-28 NOTE — Assessment & Plan Note (Addendum)
A1c stable 7.6 polymyositis illness Complications - hyperlipidemia, CAD - increases risk of future cardiovascular complications  Still tapering Prednisone  Plan:  1. Continue Metformin  Continue current diet controlled / lifestyle regimen 2. Encourage improved lifestyle - low carb, low sugar diet, reduce portion size, continue improving regular exercise 3. Check CBG, bring log to next visit for review 4. Continue ASA, ACEi - Off Repatha for now.  Future consider GLP1 agent for cardioprotective benefits

## 2021-09-28 NOTE — Assessment & Plan Note (Signed)
Managed by Rheumatology On prednisone

## 2021-09-28 NOTE — Telephone Encounter (Signed)
-----   Message from Rise Mu, [place] sent at 09/27/2021  4:26 PM EST ----- Jeannene Patella,   Please send the following request to the patient's rheumatologist.  Dr. Posey Pronto,  We are reaching out to you for guidance on the reinitiation of Mr. Mota's Repatha.  Do you feel it would be appropriate to reinitiate this medication at this time, if not when?  His current LDL is 159 with goal being less than 70.  If reinitiation of PCSK9 inhibitor remains contraindicated, would bempedoic acid or inclisiran be an option?  With your guidance, we can have him evaluated by Dr. Debara Pickett and our clinical pharmacists through our lipid clinic as needed.  Thank you for your time and guidance.  Christell Faith, PA-C

## 2021-09-30 ENCOUNTER — Telehealth: Payer: Self-pay | Admitting: Physician Assistant

## 2021-09-30 DIAGNOSIS — M609 Myositis, unspecified: Secondary | ICD-10-CM

## 2021-09-30 DIAGNOSIS — T466X5A Adverse effect of antihyperlipidemic and antiarteriosclerotic drugs, initial encounter: Secondary | ICD-10-CM

## 2021-09-30 NOTE — Telephone Encounter (Signed)
Dr. Posey Pronto, rheumatology, states that he does not recommend using the PCSK 9. Dr. Posey Pronto thinks it is ok to try Bempedoic acid.  Dr. Posey Pronto states it is ok to refer to lipid clinic. Please call to discuss.

## 2021-09-30 NOTE — Telephone Encounter (Signed)
Reviewed recommendations for referral to our lipid clinic in [facility] for further assessment and management. He was agreeable with no further questions at this time.

## 2021-09-30 NOTE — Telephone Encounter (Signed)
Please refer him to the lipid clinic to be evaluated by Dr. Debara Pickett given the complexity of his case.  I will copy his primary cardiologist as well so they are up to date with rheumatology recommendations.

## 2021-10-28 ENCOUNTER — Other Ambulatory Visit: Payer: Self-pay | Admitting: Family Medicine

## 2021-10-28 NOTE — Telephone Encounter (Signed)
Requested Prescriptions  ?Pending Prescriptions Disp Refills  ?? omega-3 acid ethyl esters (LOVAZA) 1 g capsule [Pharmacy Med Name: Omega-3-acid Ethyl Esters 1 GM Oral Capsule] 180 capsule 0  ?  Sig: Take 1 capsule by mouth twice daily  ?  ? Endocrinology:  Nutritional Agents - omega-3 acid ethyl esters Failed - 10/28/2021  1:32 PM  ?  ?  Failed - Lipid Panel in normal range within the last 12 months  ?  Cholesterol, Total  ?Date Value Ref Range Status  ?04/20/2021 270 (H) 100 - 199 mg/dL Final  ? ?LDL Cholesterol (Calc)  ?Date Value Ref Range Status  ?05/19/2020 68 mg/dL (calc) Final  ?  Comment:  ?  Reference range: <100 ?Marland Kitchen ?Desirable range <100 mg/dL for primary prevention;   ?<70 mg/dL for patients with CHD or diabetic patients  ?with > or = 2 CHD risk factors. ?. ?LDL-C is now calculated using the Martin-Hopkins  ?calculation, which is a validated novel method providing  ?better accuracy than the Friedewald equation in the  ?estimation of LDL-C.  ?Cresenciano Genre et al. Annamaria Helling. 5916;384(66): 2061-2068  ?(http://education.[facility].com/faq/FAQ164) ?  ? ?LDL Chol Calc (NIH)  ?Date Value Ref Range Status  ?04/20/2021 159 (H) 0 - 99 mg/dL Final  ? ?HDL  ?Date Value Ref Range Status  ?04/20/2021 59 >39 mg/dL Final  ? ?Triglycerides  ?Date Value Ref Range Status  ?04/20/2021 283 (H) 0 - 149 mg/dL Final  ? ?  ?  ?  Passed - Valid encounter within last 12 months  ?  Recent Outpatient Visits   ?      ? 1 month ago Type 2 diabetes mellitus with microalbuminuria, without long-term current use of insulin ([place])  ? [place], DO  ? 6 months ago Type 2 diabetes mellitus with microalbuminuria, without long-term current use of insulin ([place])  ? [place], DO  ? 1 year ago Type 2 diabetes mellitus with microalbuminuria, without long-term current use of insulin ([place])  ? [place], DO  ? 1 year ago Elevated  CK  ? [place], DO  ? 1 year ago Annual physical exam  ? [place], DO  ?  ?  ?Future Appointments   ?        ? In 4 months Hilty, Nadean Corwin, MD [facility], [facility]  ? In 5 months Parks Ranger, Devonne Doughty, DO [facility], [place]  ?  ? ?  ?  ?  ? ?

## 2022-02-14 ENCOUNTER — Other Ambulatory Visit: Payer: Self-pay | Admitting: Family Medicine

## 2022-02-14 DIAGNOSIS — E1169 Type 2 diabetes mellitus with other specified complication: Secondary | ICD-10-CM

## 2022-02-16 NOTE — Telephone Encounter (Signed)
Requested Prescriptions  Pending Prescriptions Disp Refills  . omega-3 acid ethyl esters (LOVAZA) 1 g capsule [Pharmacy Med Name: Omega-3-acid Ethyl Esters 1 GM Oral Capsule] 180 capsule 0    Sig: Take 1 capsule by mouth twice daily     Endocrinology:  Nutritional Agents - omega-3 acid ethyl esters Failed - 02/14/2022  1:04 PM      Failed - Lipid Panel in normal range within the last 12 months    Cholesterol, Total  Date Value Ref Range Status  04/20/2021 270 (H) 100 - 199 mg/dL Final   LDL Cholesterol (Calc)  Date Value Ref Range Status  05/19/2020 68 mg/dL (calc) Final    Comment:    Reference range: <100 . Desirable range <100 mg/dL for primary prevention;   <70 mg/dL for patients with CHD or diabetic patients  with > or = 2 CHD risk factors. Marland Kitchen LDL-C is now calculated using the Martin-Hopkins  calculation, which is a validated novel method providing  better accuracy than the Friedewald equation in the  estimation of LDL-C.  Cresenciano Genre et al. Annamaria Helling. 1660;630(16): 2061-2068  (http://education.[facility].com/faq/FAQ164)    LDL Chol Calc (NIH)  Date Value Ref Range Status  04/20/2021 159 (H) 0 - 99 mg/dL Final   HDL  Date Value Ref Range Status  04/20/2021 59 >39 mg/dL Final   Triglycerides  Date Value Ref Range Status  04/20/2021 283 (H) 0 - 149 mg/dL Final         Passed - Valid encounter within last 12 months    Recent Outpatient Visits          4 months ago Type 2 diabetes mellitus with microalbuminuria, without long-term current use of insulin ([place])   [place], DO   10 months ago Type 2 diabetes mellitus with microalbuminuria, without long-term current use of insulin [facility])   [facility], Devonne Doughty, DO   1 year ago Type 2 diabetes mellitus with microalbuminuria, without long-term current use of insulin [facility])   [facility] Olin Hauser, DO   1 year ago  Elevated [place], [place], DO   1 year ago Annual physical exam   [place], DO      Future Appointments            In 1 month Hilty, Nadean Corwin, MD [facility], [facility]   In 1 month Parks Ranger, Devonne Doughty, DO [facility], [place]   In 1 month Fletcher Anon, Mertie Clause, MD [facility], [place]

## 2022-03-25 ENCOUNTER — Encounter (HOSPITAL_BASED_OUTPATIENT_CLINIC_OR_DEPARTMENT_OTHER): Payer: Self-pay | Admitting: Internal Medicine

## 2022-03-25 ENCOUNTER — Ambulatory Visit (INDEPENDENT_AMBULATORY_CARE_PROVIDER_SITE_OTHER): Payer: 59 | Admitting: Internal Medicine

## 2022-03-25 VITALS — BP 118/80 | HR 78 | Ht 71.0 in | Wt 213.2 lb

## 2022-03-25 DIAGNOSIS — E785 Hyperlipidemia, unspecified: Secondary | ICD-10-CM

## 2022-03-25 DIAGNOSIS — R748 Abnormal levels of other serum enzymes: Secondary | ICD-10-CM | POA: Diagnosis not present

## 2022-03-25 DIAGNOSIS — I2510 Atherosclerotic heart disease of native coronary artery without angina pectoris: Secondary | ICD-10-CM

## 2022-03-25 DIAGNOSIS — T466X5A Adverse effect of antihyperlipidemic and antiarteriosclerotic drugs, initial encounter: Secondary | ICD-10-CM

## 2022-03-25 DIAGNOSIS — M609 Myositis, unspecified: Secondary | ICD-10-CM

## 2022-03-25 NOTE — Patient Instructions (Signed)
Medication Instructions:  NO CHANGES today   *If you need a refill on your cardiac medications before your next appointment, please call your pharmacy*   Lab Work: FASTING lab work to check cholesterol at a [place] location   If you have labs (blood work) drawn today and your tests are completely normal, you will receive your results only by: [employer] (if you have MyChart) OR A paper copy in the mail If you have any lab test that is abnormal or we need to change your treatment, we will call you to review the results.   Testing/Procedures: NONE   Follow-Up: At [facility], you and your health needs are our priority.  As part of our continuing mission to provide you with exceptional heart care, we have created designated Provider Care Teams.  These Care Teams include your primary Cardiologist (physician) and Advanced Practice Providers (APPs -  Physician Assistants and Nurse Practitioners) who all work together to provide you with the care you need, when you need it.  We recommend signing up for the patient portal called "MyChart".  Sign up information is provided on this After Visit Summary.  MyChart is used to connect with patients for Virtual Visits (Telemedicine).  Patients are able to view lab/test results, encounter notes, upcoming appointments, etc.  Non-urgent messages can be sent to your provider as well.   To learn more about what you can do with MyChart, go to [email or web address].    Your next appointment:   3 month(s)  The format for your next appointment:   In Person  Provider:   Lyman [facility] MD

## 2022-03-28 NOTE — Progress Notes (Signed)
LIPID CLINIC CONSULT NOTE  Chief Complaint:  Statin myopathy  Primary Care Physician: Olin Hauser, DO  Primary Cardiologist:  Kathlyn [place], MD  HPI:  Stephen [name] is a 46 y.o. male who is being seen today for the evaluation of myopathy at the request of Rise Mu, PA-C.  This is a pleasant 46 year old male with recent significant myopathy, diagnosed with inflammatory polymyositis.  He had initially been on statins because of a history of coronary artery disease with prior MI and developed significant myopathy with high CKs.  Statins were stopped more than a year ago but he has had recurrent muscle pain and high CKs.  He has been followed by rheumatology at the [facility] clinic and has been on steroids as well as methotrexate but it was noted that methotrexate was not effective.  His steroids were recently weaned and his CK is continuing to rise.  He has also had multiple treatments of IVIG.  Cholesterol has been quite high with LDL-P of 1483, HDL-P33.9 And small LDL-P of 916.  Calculated LDL was 111.  CK is mention was 2823 earlier this year but then rose up to 5121.  PMHx:  Past Medical History:  Diagnosis Date   CAD (coronary artery disease)    a. LHC 04/2017: D2 60%, pLCx 30%, OM2 85%, OM3 100%L-L collats, m-dRCA 100% L-R collats, LVEF 55-65%; b. LHC 05/15/2017: D2 60%, pLCx 30%, OM2-1 85% s/p PCI/DES, OM2-2 30%, OM3 100% L-L collats, m-dRCA 100% L-R collats     Diabetes ([place])    History of echocardiogram    a. 04/2017: EF 55-60%, normal wall motion, normal LV diastolic function, no significant valvular abnormalities   Hyperlipidemia    Kidney stones 2002, 2008   no stone eval   Myocardial infarction [facility])     Past Surgical History:  Procedure Laterality Date   APPENDECTOMY  2000   CARDIAC CATHETERIZATION     CORONARY STENT INTERVENTION N/A 05/15/2017   Procedure: CORONARY STENT INTERVENTION;  Surgeon: Wellington Hampshire, MD;  Location: [place] CV [facility];   Service: Cardiovascular;  Laterality: N/A;   LEFT HEART CATH AND CORONARY ANGIOGRAPHY Left 04/24/2017   Procedure: LEFT HEART CATH AND CORONARY ANGIOGRAPHY;  Surgeon: Wellington Hampshire, MD;  Location: [place] [facility];  Service: Cardiovascular;  Laterality: Left;    FAMHx:  Family History  Problem Relation Age of Onset   Heart disease Mother    Thyroid disease Mother    CAD Mother 62   Hypertension Mother    Hyperlipidemia Mother    Chronic Renal Failure Mother    Cancer Father        pancreatic s/p Whipple procedure   Diabetes Father    Cholecystitis Father    Pancreatic cancer Father    Hyperthyroidism Sister    Lung cancer Paternal Aunt    Diabetes Paternal Uncle    Stroke Paternal Uncle    Multiple sclerosis Sister    Hyperthyroidism Sister    Hyperthyroidism Sister    Diabetes Sister    Healthy Sister    Healthy Son    Healthy Son    Pancreatic cancer Paternal Uncle    Stomach cancer Paternal Aunt    Vision loss Neg Hx    Heart attack Neg Hx    Breast cancer Neg Hx    Colon cancer Neg Hx    Prostate cancer Neg Hx    Esophageal cancer Neg Hx     SOCHx:   reports  that he quit smoking about 27 years ago. His smoking use included cigarettes. He has a 0.30 pack-year smoking history. He has never used smokeless tobacco. He reports current alcohol use. He reports that he does not use drugs.  ALLERGIES:  Allergies  Allergen Reactions   Statins Other (See Comments)    ROS: Pertinent items noted in HPI and remainder of comprehensive ROS otherwise negative.  HOME MEDS: Current Outpatient Medications on File Prior to Visit  Medication Sig Dispense Refill   acetaminophen (TYLENOL) 500 MG tablet Take by mouth as needed.     aspirin EC 81 MG tablet Take 1 tablet (81 mg total) by mouth daily. 90 tablet 3   Blood Glucose Monitoring Suppl (FIFTY50 GLUCOSE METER 2.0) w/Device KIT USE 1 TEST STRIP TO CHECK GLUCOSE UP TO TWICE DAILY AS INSTRUCTED     CONTOUR NEXT TEST  test strip USE 1 STRIP TO CHECK GLUCOSE UP TO TWICE DAILY AS DIRECTED 893 each 0   folic acid (FOLVITE) 1 MG tablet Take 1 mg by mouth daily.     lisinopril (ZESTRIL) 2.5 MG tablet Take 1 tablet (2.5 mg total) by mouth daily. 90 tablet 3   metFORMIN (GLUCOPHAGE-XR) 500 MG 24 hr tablet TAKE 2 TABLETS BY MOUTH ONCE DAILY WITH BREAKFAST 180 tablet 3   Microlet Lancets MISC USE 1 LANCET TO CHECK BLOOD SUGAR TWICE DAILY AS DIRECTED 100 each 4   mycophenolate (CELLCEPT) 500 MG tablet Take by mouth.     nitroGLYCERIN (NITROSTAT) 0.4 MG SL tablet Place 1 tablet (0.4 mg total) under the tongue every 5 (five) minutes as needed for chest pain. 25 tablet 1   omega-3 acid ethyl esters (LOVAZA) 1 g capsule Take 1 capsule by mouth twice daily 180 capsule 0   omeprazole (PRILOSEC) 20 MG capsule Take 1 capsule (20 mg total) by mouth daily before breakfast. 90 capsule 3   predniSONE (DELTASONE) 20 MG tablet Take 20 mg by mouth daily.     No current facility-administered medications on file prior to visit.    LABS/IMAGING: No results found for this or any previous visit (from the past 48 hour(s)). No results found.  LIPID PANEL:    Component Value Date/Time   CHOL 270 (H) 04/20/2021 1545   TRIG 283 (H) 04/20/2021 1545   HDL 59 04/20/2021 1545   CHOLHDL 4.6 04/20/2021 1545   CHOLHDL 2.8 05/19/2020 0805   VLDL 18 10/28/2019 0800   LDLCALC 159 (H) 04/20/2021 1545   LDLCALC 68 05/19/2020 0805    WEIGHTS: Wt Readings from Last 3 Encounters:  03/25/22 213 lb 3.2 oz (96.7 kg)  09/27/21 219 lb (99.3 kg)  09/27/21 218 lb (98.9 kg)    VITALS: BP 118/80   Pulse 78   Ht '5\' 11"'  (1.803 m)   Wt 213 lb 3.2 oz (96.7 kg)   SpO2 97%   BMI 29.74 kg/m   EXAM: General appearance: alert and no distress Neck: no carotid bruit, no JVD, and thyroid not enlarged, symmetric, no tenderness/mass/nodules Lungs: clear to auscultation bilaterally Heart: regular rate and rhythm Abdomen: soft, non-tender; bowel sounds  normal; no masses,  no organomegaly Extremities: extremities normal, atraumatic, no cyanosis or edema Pulses: 2+ and symmetric Skin: Skin color, texture, turgor normal. No rashes or lesions Neurologic: Grossly normal Psych: Pleasant  EKG: Deferred  ASSESSMENT: Coronary artery disease with prior MI, goal LDL less than 55 Statin associated myositis/myopathy Polymyositis Abnormal liver enzymes  PLAN: 1.   Stephen [name] has a history  of coronary artery disease with prior MI and a target LDL less than 70.  He initially had what was thought to be statin associated myopathy/myositis.  Subsequently he has had recurrent elevation in CK is not on statin therapy and has failed weaning of steroids with worsening CKs.  This is concerning for an immune polymyositis.  That being said, he is not a candidate for statins in the future.  He was briefly on Repatha and may be a good candidate for PCSK9 inhibitor or the mRNA inhibitor Leqvio.  That being said, most recent CK values were climbing and I am hesitant to add any therapy until those have declined or normalized.  He does have upcoming follow-up with his rheumatologist.  We will hold off on any therapy for a few months and plan follow-up after repeat labs.  Thanks again for this interesting referral.  Pixie Casino, MD, FACC, [place] Director of the Advanced Lipid Disorders &  Cardiovascular Risk Reduction Clinic Diplomate of the American Board of Clinical Lipidology Attending Cardiologist  Direct Dial: [phone]  Fax: [phone]  Website:  [email or web address] 03/28/2022, 9:55 PM

## 2022-03-29 ENCOUNTER — Encounter: Payer: Self-pay | Admitting: Family Medicine

## 2022-03-29 ENCOUNTER — Ambulatory Visit (INDEPENDENT_AMBULATORY_CARE_PROVIDER_SITE_OTHER): Payer: 59 | Admitting: Family Medicine

## 2022-03-29 VITALS — BP 131/84 | HR 76 | Ht 70.0 in | Wt 211.4 lb

## 2022-03-29 DIAGNOSIS — Z1211 Encounter for screening for malignant neoplasm of colon: Secondary | ICD-10-CM | POA: Diagnosis not present

## 2022-03-29 DIAGNOSIS — E1169 Type 2 diabetes mellitus with other specified complication: Secondary | ICD-10-CM | POA: Diagnosis not present

## 2022-03-29 DIAGNOSIS — E1129 Type 2 diabetes mellitus with other diabetic kidney complication: Secondary | ICD-10-CM | POA: Diagnosis not present

## 2022-03-29 DIAGNOSIS — E785 Hyperlipidemia, unspecified: Secondary | ICD-10-CM

## 2022-03-29 DIAGNOSIS — R809 Proteinuria, unspecified: Secondary | ICD-10-CM | POA: Diagnosis not present

## 2022-03-29 DIAGNOSIS — Z0000 Encounter for general adult medical examination without abnormal findings: Secondary | ICD-10-CM

## 2022-03-29 DIAGNOSIS — I2510 Atherosclerotic heart disease of native coronary artery without angina pectoris: Secondary | ICD-10-CM

## 2022-03-29 DIAGNOSIS — M3322 Polymyositis with myopathy: Secondary | ICD-10-CM

## 2022-03-29 DIAGNOSIS — E669 Obesity, unspecified: Secondary | ICD-10-CM

## 2022-03-29 LAB — POCT GLYCOSYLATED HEMOGLOBIN (HGB A1C): Hemoglobin A1C: 8.8 % — AB (ref 4.0–5.6)

## 2022-03-29 NOTE — Assessment & Plan Note (Addendum)
Managed by Rheumatology On prednisone higher dose again and on mycophenolate

## 2022-03-29 NOTE — Assessment & Plan Note (Signed)
Previously Dramatic improved control on Repatha PCSK9 inhibitor Now off med CAD s/p MI, DM2 Off statins due to intolerance (atorvastatin, rosuvastatin), also prior on lovaza, zetia Off Repatha while dx with polymyositis  Plan: 1. Continue Lovaza 2. Continue ASA '81mg'$  for secondary ASCVD risk reduction 3. Encourage improved lifestyle - low carb/cholesterol, reduce portion size, continue improving regular exercise  Return to Cardiology as planned

## 2022-03-29 NOTE — Assessment & Plan Note (Signed)
Stable without angina Controlled s/p stent placement and med management per Cardiology

## 2022-03-29 NOTE — Assessment & Plan Note (Addendum)
Due for POC A1c today = A1c 8.8 polymyositis illness Complications - hyperlipidemia, CAD - increases risk of future cardiovascular complications  Now on higher dose prednisone of recent past 1 month, w hyperglycemia  Plan:  1. Continue Metformin XR '500mg'$  x 2 = '1000mg'$  daily - Discussion on next options for GLP1 therapy, gave free sample Ozempic 0.25/0.'5mg'$  6 week sample, he will check cost coverage, consider Rybelsus. Awaiting update from patient on insurance / status trial on med  Continue current diet controlled / lifestyle regimen 2. Encourage improved lifestyle - low carb, low sugar diet, reduce portion size, continue improving regular exercise 3. Check CBG, bring log to next visit for review 4. Continue ASA, ACEi - Off Repatha for now.

## 2022-03-29 NOTE — Assessment & Plan Note (Signed)
Check urine microalbumin later this week

## 2022-03-29 NOTE — Patient Instructions (Addendum)
Thank you for coming to the office today.  Call insurance find cost and coverage of the following - check the following: - Drug Tier, Preferred List, On Formulary - All will require a "Prior Authorization" from [place] first, before you can find out the cost - Find out if there is "Step Therapy" (other medicines required before you can try these)  Once you pick the one you want to try, let me know - we can get a sample ready IF we have it in stock. Then try it - and before running out of medicine, contact me back to order your Rx so we have time to get it processed.  ---------------------------------------------  Call insurance find cost and coverage of the following   1. Ozempic (Semaglutide injection) - start 0.'25mg'$  weekly for 4 weeks then increase to 0.'5mg'$  weekly - This one has best benefit of weight loss and reducing Cardiovascular events   2. Mounjaro newest injection weekly.   3. Trulicity (Dulaglutide) - once weekly - this is very good one, usually one of my top choices as well, two doses, 0.75 (likely we would start) and 1.5 max dose. We can use coupon card here too  4. Rybelsus oral pill    Rybelsus (pill - oral semaglutide or ozempic) - once daily, taken first thing in morning without other meds Ozempic (injection once week Trulicity (injection once week   Please schedule a Follow-up Appointment to: Return in about 4 months (around 07/29/2022) for 4 month follow up DM A1c.  If you have any other questions or concerns, please feel free to call the office or send a message through [place]. You may also schedule an earlier appointment if necessary.  Additionally, you may be receiving a survey about your experience at our office within a few days to 1 week by e-mail or mail. We value your feedback.  Nobie Putnam, DO [facility]

## 2022-03-29 NOTE — Progress Notes (Signed)
Subjective:    Patient ID: Stephen [name], male    DOB: 1976-02-03, 46 y.o.   MRN: [id number]  Trust Leh is a 46 y.o. male presenting on 03/29/2022 for Annual Exam   HPI  Here for Annual Physical and Lab review.  CHRONIC DM, Type 2 / Obesity BMI >30 Prior A1c 6 range, last 05/2020 was 6.2 CBGs: Avg elevated previous better control now on prednisone higher dose 200+ - His prednisone was lowered from 10 to 36m recently has next apt in March 2023 for further dose adjustment Meds: Metformin XR 5011mx 2 = 100027maily Currently on ACEi Lifestyle: - Diet off alcohol, improved diet - Exercise - unable to resume due to muscle problem - fam history diabetes Due for DM Eye Exam Denies hypoglycemia, polyuria, visual changes, numbness or tingling.   HYPERLIPIDEMIA / Elevated LFTs / CAD PMH MI x 3 in 2018, followed by Dr AriFletcher Anon[facility]di[facility] - Reports concerns with hyperlipidemia / fatty liver history - Taken off Statin therapy and started on Repatha PCKS9 inhib in 02/2020 Remains off Statin, Zetia, Repatha per Rheum   Polymyositis with myopathy [facility] PatPosey Prontoproved on immune suppression therapy Recent dose increase by Dr PatPosey Prontoth dose inc Prednisone to 79m11mily with upcoming infusions Off MTX and Folic Acid On Mycophenolate  Health Maintenance:  Colon CA Screening: Never had colonoscopy or screening. Asymptomatic no fam history.Due will proceed w/ colonoscopy [place] for transport      09/27/2021   10:17 AM 04/16/2021    8:49 AM 05/26/2020    8:14 AM  Depression screen PHQ 2/9  Decreased Interest 0 0 0  Down, Depressed, Hopeless 0 0 0  PHQ - 2 Score 0 0 0  Altered sleeping 0 0   Tired, decreased energy 0 1   Change in appetite 0 0   Feeling bad or failure about yourself  0 0   Trouble concentrating 0 0   Moving slowly or fidgety/restless 0 0   Suicidal thoughts 0 0   PHQ-9 Score 0 1   Difficult doing work/chores Not difficult at all Not difficult at  all     Past Medical History:  Diagnosis Date   CAD (coronary artery disease)    a. LHC 04/2017: D2 60%, pLCx 30%, OM2 85%, OM3 100%L-L collats, m-dRCA 100% L-R collats, LVEF 55-65%; b. LHC 05/15/2017: D2 60%, pLCx 30%, OM2-1 85% s/p PCI/DES, OM2-2 30%, OM3 100% L-L collats, m-dRCA 100% L-R collats     Diabetes (HCC)[place] History of echocardiogram    a. 04/2017: EF 55-60%, normal wall motion, normal LV diastolic function, no significant valvular abnormalities   Hyperlipidemia    Kidney stones 2002, 2008   no stone eval   [facility] Past Surgical History:  Procedure Laterality Date   APPENDECTOMY  2000   CARDIAC CATHETERIZATION     CORONARY STENT INTERVENTION N/A 05/15/2017   Procedure: CORONARY STENT INTERVENTION;  Surgeon: AridWellington Hampshire;  [facility];  Service: Cardiovascular;  Laterality: N/A;   LEFT HEART CATH AND CORONARY ANGIOGRAPHY Left 04/24/2017   Procedure: LEFT HEART CATH AND CORONARY ANGIOGRAPHY;  Surgeon: AridWellington Hampshire;  [facility];  Service: Cardiovascular;  Laterality: Left;   Social History   Socioeconomic History   Marital status: Divorced    Spouse name: Not on file   Number of children: Not on file   Years of education:  Not on file   Highest education level: Not on file  Occupational History   Not on file  Tobacco Use   Smoking status: Former    Packs/day: 0.10    Years: 3.00    Total pack years: 0.30    Types: Cigarettes    Quit date: 08/15/1994    Years since quitting: 27.6   Smokeless tobacco: Never   Tobacco comments:    weekend smoker, pt would only smoke bout 1 -2 cig on the weekend.   Vaping Use   Vaping Use: Never used  Substance and Sexual Activity   Alcohol use: Yes    Comment: on week-ends   Drug use: No   Sexual activity: Yes    Comment: preventing w/ partner contraception  Other Topics Concern   Not on file  Social History Narrative   Not on file   Social  Determinants of Health   Financial Resource Strain: Not on file  Food Insecurity: Not on file  Transportation Needs: Not on file  Physical Activity: Not on file  Stress: Not on file  Social Connections: Not on file  Intimate Partner Violence: Not on file   Family History  Problem Relation Age of Onset   Heart disease Mother    Thyroid disease Mother    CAD Mother 50   Hypertension Mother    Hyperlipidemia Mother    Chronic Renal Failure Mother    Cancer Father        pancreatic s/p Whipple procedure   Diabetes Father    Cholecystitis Father    Pancreatic cancer Father    Hyperthyroidism Sister    Lung cancer Paternal Aunt    Diabetes Paternal Uncle    Stroke Paternal Uncle    Multiple sclerosis Sister    Hyperthyroidism Sister    Hyperthyroidism Sister    Diabetes Sister    Healthy Sister    Healthy Son    Healthy Son    Pancreatic cancer Paternal Uncle    Stomach cancer Paternal Aunt    Vision loss Neg Hx    Heart attack Neg Hx    Breast cancer Neg Hx    Colon cancer Neg Hx    Prostate cancer Neg Hx    Esophageal cancer Neg Hx    Current Outpatient Medications on File Prior to Visit  Medication Sig   acetaminophen (TYLENOL) 500 MG tablet Take by mouth as needed.   aspirin EC 81 MG tablet Take 1 tablet (81 mg total) by mouth daily.   Blood Glucose Monitoring Suppl (FIFTY50 GLUCOSE METER 2.0) w/Device KIT USE 1 TEST STRIP TO CHECK GLUCOSE UP TO TWICE DAILY AS INSTRUCTED   CONTOUR NEXT TEST test strip USE 1 STRIP TO CHECK GLUCOSE UP TO TWICE DAILY AS DIRECTED   lisinopril (ZESTRIL) 2.5 MG tablet Take 1 tablet (2.5 mg total) by mouth daily.   metFORMIN (GLUCOPHAGE-XR) 500 MG 24 hr tablet TAKE 2 TABLETS BY MOUTH ONCE DAILY WITH BREAKFAST   Microlet Lancets MISC USE 1 LANCET TO CHECK BLOOD SUGAR TWICE DAILY AS DIRECTED   mycophenolate (CELLCEPT) 500 MG tablet Take by mouth.   nitroGLYCERIN (NITROSTAT) 0.4 MG SL tablet Place 1 tablet (0.4 mg total) under the tongue  every 5 (five) minutes as needed for chest pain.   omega-3 acid ethyl esters (LOVAZA) 1 g capsule Take 1 capsule by mouth twice daily   omeprazole (PRILOSEC) 20 MG capsule Take 1 capsule (20 mg total) by mouth daily before breakfast.  predniSONE (DELTASONE) 20 MG tablet Take 20 mg by mouth daily.   No current facility-administered medications on file prior to visit.    Review of Systems  Constitutional:  Negative for activity change, appetite change, chills, diaphoresis, fatigue and fever.  HENT:  Negative for congestion and hearing loss.   Eyes:  Negative for visual disturbance.  Respiratory:  Negative for cough, chest tightness, shortness of breath and wheezing.   Cardiovascular:  Negative for chest pain, palpitations and leg swelling.  Gastrointestinal:  Negative for abdominal pain, constipation, diarrhea, nausea and vomiting.  Genitourinary:  Negative for dysuria, frequency and hematuria.  Musculoskeletal:  Negative for arthralgias and neck pain.  Skin:  Negative for rash.  Neurological:  Negative for dizziness, weakness, light-headedness, numbness and headaches.  Hematological:  Negative for adenopathy.  Psychiatric/Behavioral:  Negative for behavioral problems, dysphoric mood and sleep disturbance.    Per HPI unless specifically indicated above      Objective:    BP 131/84   Pulse 76   Ht '5\' 10"'  (1.778 m)   Wt 211 lb 6.4 oz (95.9 kg)   SpO2 99%   BMI 30.33 kg/m   Wt Readings from Last 3 Encounters:  03/29/22 211 lb 6.4 oz (95.9 kg)  03/25/22 213 lb 3.2 oz (96.7 kg)  09/27/21 219 lb (99.3 kg)    Physical Exam Vitals and nursing note reviewed.  Constitutional:      General: He is not in acute distress.    Appearance: He is well-developed. He is obese. He is not diaphoretic.     Comments: Well-appearing, comfortable, cooperative  HENT:     Head: Normocephalic and atraumatic.  Eyes:     General:        Right eye: No discharge.        Left eye: No discharge.      Conjunctiva/sclera: Conjunctivae normal.     Pupils: Pupils are equal, round, and reactive to light.  Neck:     Thyroid: No thyromegaly.     Vascular: No carotid bruit.  Cardiovascular:     Rate and Rhythm: Normal rate and regular rhythm.     Pulses: Normal pulses.     Heart sounds: Normal heart sounds. No murmur heard. Pulmonary:     Effort: Pulmonary effort is normal. No respiratory distress.     Breath sounds: Normal breath sounds. No wheezing or rales.  Abdominal:     General: Bowel sounds are normal. There is no distension.     Palpations: Abdomen is soft. There is no mass.     Tenderness: There is no abdominal tenderness.  Musculoskeletal:        General: No tenderness. Normal range of motion.     Cervical back: Normal range of motion and neck supple.     Right lower leg: No edema.     Left lower leg: No edema.     Comments: Upper / Lower Extremities: - Normal muscle tone, strength bilateral upper extremities 5/5, lower extremities 5/5  Lymphadenopathy:     Cervical: No cervical adenopathy.  Skin:    General: Skin is warm and dry.     Findings: No erythema or rash.  Neurological:     Mental Status: He is alert and oriented to person, place, and time.     Comments: Distal sensation intact to light touch all extremities  Psychiatric:        Mood and Affect: Mood normal.        Behavior: Behavior normal.  Thought Content: Thought content normal.     Comments: Well groomed, good eye contact, normal speech and thoughts       Diabetic Foot Exam - Simple   Simple Foot Form Diabetic Foot exam was performed with the following findings: Yes 03/29/2022  8:22 AM  Visual Inspection No deformities, no ulcerations, no other skin breakdown bilaterally: Yes Sensation Testing Intact to touch and monofilament testing bilaterally: Yes Pulse Check Posterior Tibialis and Dorsalis pulse intact bilaterally: Yes Comments     Results for orders placed or performed in visit on  03/29/22  POCT glycosylated hemoglobin (Hb A1C)  Result Value Ref Range   Hemoglobin A1C 8.8 (A) 4.0 - 5.6 %      Assessment & Plan:   Problem List Items Addressed This Visit     Coronary artery disease    Stable without angina Controlled s/p stent placement and med management per Cardiology      Hyperlipidemia associated with type 2 diabetes mellitus ([place])    Previously Dramatic improved control on Repatha PCSK9 inhibitor Now off med CAD s/p MI, DM2 Off statins due to intolerance (atorvastatin, rosuvastatin), also prior on lovaza, zetia Off Repatha while dx with polymyositis  Plan: 1. Continue Lovaza 2. Continue ASA 64m for secondary ASCVD risk reduction 3. Encourage improved lifestyle - low carb/cholesterol, reduce portion size, continue improving regular exercise  Return to Cardiology as planned      Microalbuminuria due to type 2 diabetes mellitus (HCC)    Check urine microalbumin later this week      [place]    Managed by Rheumatology On prednisone higher dose again and on mycophenolate      Type 2 diabetes mellitus with microalbuminuria, without long-term current use of insulin (HCC)    Due for POC A1c today = A1c 8.8 polymyositis illness Complications - hyperlipidemia, CAD - increases risk of future cardiovascular complications  Now on higher dose prednisone of recent past 1 month, w hyperglycemia  Plan:  1. Continue Metformin XR 5040mx 2 = 100052maily - Discussion on next options for GLP1 therapy, gave free sample Ozempic 0.25/0.5mg53mweek sample, he will check cost coverage, consider Rybelsus. Awaiting update from patient on insurance / status trial on med  Continue current diet controlled / lifestyle regimen 2. Encourage improved lifestyle - low carb, low sugar diet, reduce portion size, continue improving regular exercise 3. Check CBG, bring log to next visit for review 4. Continue ASA, ACEi - Off Repatha for now.       Relevant Orders   POCT glycosylated hemoglobin (Hb A1C) (Completed)   Urine Microalbumin w/creat. ratio   Other Visit Diagnoses     Annual physical exam    -  Primary   Screening for colon cancer       Relevant Orders   Ambulatory referral to Gastroenterology   Obesity (BMI 30.0-34.9)           Updated Health Maintenance information Reviewed recent blood work from previous labs from other specialists Due for A1c today Encouraged improvement to lifestyle with diet and exercise Goal of weight loss  Referral to GI for Colonoscopy. [place].  Orders Placed This Encounter  Procedures   Urine Microalbumin w/creat. ratio   Ambulatory referral to Gastroenterology    Referral Priority:   Routine    Referral Type:   Consultation    Referral Reason:   Specialty Services Required    Number of Visits Requested:   1  POCT glycosylated hemoglobin (Hb A1C)     No orders of the defined types were placed in this encounter.     Follow up plan: Return in about 4 months (around 07/29/2022) for 4 month follow up DM A1c.  Nobie Putnam, [facility] 03/29/2022, 8:09 AM

## 2022-03-30 LAB — NMR, LIPOPROFILE
Cholesterol, Total: 366 mg/dL — ABNORMAL HIGH (ref 100–199)
HDL Particle Number: 27.5 umol/L — ABNORMAL LOW (ref >=30.5)
HDL-C: 44 mg/dL (ref >39)
LDL Particle Number: >3500 nmol/L — ABNORMAL HIGH (ref <1000)
LDL Size: 21 nm (ref >20.5)
LDL-C (NIH Calc): 270 mg/dL — ABNORMAL HIGH (ref 0–99)
LP-IR Score: 60 — ABNORMAL HIGH (ref <=45)
Small LDL Particle Number: 1706 nmol/L — ABNORMAL HIGH (ref <=527)
Triglycerides: 242 mg/dL — ABNORMAL HIGH (ref 0–149)

## 2022-03-30 LAB — LIPOPROTEIN A (LPA): Lipoprotein (a): 23.2 nmol/L (ref <75.0)

## 2022-03-30 LAB — MICROALBUMIN / CREATININE URINE RATIO
Creatinine, Urine: 33 mg/dL (ref 20–320)
Microalb Creat Ratio: 21 mcg/mg creat (ref <30)
Microalb, Ur: 0.7 mg/dL

## 2022-04-07 ENCOUNTER — Encounter: Payer: Self-pay | Admitting: Cardiovascular Disease

## 2022-04-07 ENCOUNTER — Ambulatory Visit (INDEPENDENT_AMBULATORY_CARE_PROVIDER_SITE_OTHER): Payer: 59 | Admitting: Cardiovascular Disease

## 2022-04-07 VITALS — BP 112/80 | HR 68 | Ht 70.0 in | Wt 212.2 lb

## 2022-04-07 DIAGNOSIS — E782 Mixed hyperlipidemia: Secondary | ICD-10-CM | POA: Diagnosis not present

## 2022-04-07 DIAGNOSIS — I2510 Atherosclerotic heart disease of native coronary artery without angina pectoris: Secondary | ICD-10-CM | POA: Diagnosis not present

## 2022-04-07 NOTE — Patient Instructions (Signed)
Medication Instructions:  Your physician recommends that you continue on your current medications as directed. Please refer to the Current Medication list given to you today.  *If you need a refill on your cardiac medications before your next appointment, please call your pharmacy*   Lab Work: None ordeerd If you have labs (blood work) drawn today and your tests are completely normal, you will receive your results only by: [place] (if you have MyChart) OR A paper copy in the mail If you have any lab test that is abnormal or we need to change your treatment, we will call you to review the results.   Testing/Procedures: None ordered   Follow-Up: At [facility], you and your health needs are our priority.  As part of our continuing mission to provide you with exceptional heart care, we have created designated Provider Care Teams.  These Care Teams include your primary Cardiologist (physician) and Advanced Practice Providers (APPs -  Physician Assistants and Nurse Practitioners) who all work together to provide you with the care you need, when you need it.  We recommend signing up for the patient portal called "MyChart".  Sign up information is provided on this After Visit Summary.  MyChart is used to connect with patients for Virtual Visits (Telemedicine).  Patients are able to view lab/test results, encounter notes, upcoming appointments, etc.  Non-urgent messages can be sent to your provider as well.   To learn more about what you can do with MyChart, go to [email or web address].    Your next appointment:   6 month(s)  The format for your next appointment:   In Person  Provider:   You may see Kathlyn Sacramento, MD or one of the following Advanced Practice Providers on your designated Care Team:   Murray Hodgkins, NP Christell Faith, PA-C Cadence Kathlen Mody, [place]   Other Instructions N/A  Important Information About Sugar

## 2022-04-07 NOTE — Progress Notes (Signed)
  Cardiology Office Note   Date:  04/07/2022   ID:  Stephen [name], DOB 08/12/1976, MRN [id number]  PCP:  Karamalegos, Alexander J, DO  Cardiologist:   Muhammad Arida, MD   Chief Complaint  Patient presents with   Other    6 Month f/u no complaints today. Meds reviewed verbally with pt.       History of Present Illness: Stephen [name] is a 46 y.o. male who is here today for a follow-up regarding coronary artery disease.  He has known history of type 2 diabetes and hyperlipidemia.   He had unstable angina in September 2018 with abnormal nuclear stress test.  EF was normal by echo. Cardiac catheterization  showed significant underlying 2 -vessel coronary artery disease with subacute thrombotic occlusion of the mid to distal right coronary artery with left-to-right collaterals, occluded OM 3 with left to left collaterals, significant stenosis in a large OM 2 and moderate disease in second diagonal. The LAD was mildly and diffusely diseased. Ejection fraction was normal. Left ventricular end-diastolic pressure was high normal. He was initially treated medically  but had worsening angina and thus  underwent successful PCI and drug-eluting stent placement to OM2.  The RCA was left to be treated medically given the well-developed collaterals.  He had elevated liver enzymes on both atorvastatin and rosuvastatin.  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  Nonetheless, he continued to struggle with this in spite of stopping all potential medications.  He failed methotrexate and was recently placed on CellCept.  He continues to be on prednisone and was recently restarted on IVIG given recurrent symptoms and elevated CPK.  He feels better now and his CPK decreased by half.  No chest pain or shortness of breath. Echocardiogram in October 2022 showed normal LV systolic function with mild mitral regurgitation. Outpatient monitor in September 2022 showed sinus  rhythm with rare PACs and rare PVCs and no significant arrhythmia overall.  Past Medical History:  Diagnosis Date   CAD (coronary artery disease)    a. LHC 04/2017: D2 60%, pLCx 30%, OM2 85%, OM3 100%L-L collats, m-dRCA 100% L-R collats, LVEF 55-65%; b. LHC 05/15/2017: D2 60%, pLCx 30%, OM2-1 85% s/p PCI/DES, OM2-2 30%, OM3 100% L-L collats, m-dRCA 100% L-R collats     Diabetes (HCC)    History of echocardiogram    a. 04/2017: EF 55-60%, normal wall motion, normal LV diastolic function, no significant valvular abnormalities   Hyperlipidemia    Kidney stones 2002, 2008   no stone eval   Myocardial infarction (HCC)     Past Surgical History:  Procedure Laterality Date   APPENDECTOMY  2000   CARDIAC CATHETERIZATION     CORONARY STENT INTERVENTION N/A 05/15/2017   Procedure: CORONARY STENT INTERVENTION;  Surgeon: Arida, Muhammad A, MD;  Location: [facility] LAB;  Service: Cardiovascular;  Laterality: N/A;   LEFT HEART CATH AND CORONARY ANGIOGRAPHY Left 04/24/2017   Procedure: LEFT HEART CATH AND CORONARY ANGIOGRAPHY;  Surgeon: Arida, Muhammad A, MD;  Location: [facility] INVASIVE CV LAB;  Service: Cardiovascular;  Laterality: Left;     Current Outpatient Medications  Medication Sig Dispense Refill   acetaminophen (TYLENOL) 500 MG tablet Take by mouth as needed.     aspirin EC 81 MG tablet Take 1 tablet (81 mg total) by mouth daily. 90 tablet 3   Blood Glucose Monitoring Suppl (FIFTY50 GLUCOSE METER 2.0) w/Device KIT USE 1 TEST STRIP TO CHECK GLUCOSE UP TO   TWICE DAILY AS INSTRUCTED     CONTOUR NEXT TEST test strip USE 1 STRIP TO CHECK GLUCOSE UP TO TWICE DAILY AS DIRECTED 200 each 0   lisinopril (ZESTRIL) 2.5 MG tablet Take 1 tablet (2.5 mg total) by mouth daily. 90 tablet 3   metFORMIN (GLUCOPHAGE-XR) 500 MG 24 hr tablet TAKE 2 TABLETS BY MOUTH ONCE DAILY WITH BREAKFAST 180 tablet 3   Microlet Lancets MISC USE 1 LANCET TO CHECK BLOOD SUGAR TWICE DAILY AS DIRECTED 100 each 4   mycophenolate  (CELLCEPT) 500 MG tablet Take 1,000 mg by mouth 2 (two) times daily.     nitroGLYCERIN (NITROSTAT) 0.4 MG SL tablet Place 1 tablet (0.4 mg total) under the tongue every 5 (five) minutes as needed for chest pain. 25 tablet 1   omega-3 acid ethyl esters (LOVAZA) 1 g capsule Take 1 capsule by mouth twice daily 180 capsule 0   omeprazole (PRILOSEC) 20 MG capsule Take 1 capsule (20 mg total) by mouth daily before breakfast. 90 capsule 3   predniSONE (DELTASONE) 20 MG tablet Take 20 mg by mouth daily.     No current facility-administered medications for this visit.    Allergies:   Statins    Social History:  The patient  reports that he quit smoking about 27 years ago. His smoking use included cigarettes. He has a 0.30 pack-year smoking history. He has never used smokeless tobacco. He reports current alcohol use. He reports that he does not use drugs.   Family History:  [name] family history includes CAD (age of onset: 11) in his mother; Cancer in his father; Cholecystitis in his father; Chronic Renal Failure in his mother; Diabetes in his father, paternal uncle, and sister; Healthy in his sister, son, and son; Heart disease in his mother; Hyperlipidemia in his mother; Hypertension in his mother; Hyperthyroidism in his sister, sister, and sister; Lung cancer in his paternal aunt; Multiple sclerosis in his sister; Pancreatic cancer in his father and paternal uncle; Stomach cancer in his paternal aunt; Stroke in his paternal uncle; Thyroid disease in his mother.    ROS:  Please see the history of present illness.   Otherwise, review of systems are positive for none.   All other systems are reviewed and negative.    PHYSICAL EXAM: VS:  BP 112/80 (BP Location: Left Arm, Patient Position: Sitting, Cuff Size: Normal)   Pulse 68   Ht 5' 10" (1.778 m)   Wt 212 lb 4 oz (96.3 kg)   SpO2 97%   BMI 30.45 kg/m  , BMI Body mass index is 30.45 kg/m. GEN: Well nourished, well developed, in no acute  distress  HEENT: normal  Neck: no JVD, carotid bruits, or masses Cardiac: RRR; no murmurs, rubs, or gallops,no edema  Respiratory:  clear to auscultation bilaterally, normal work of breathing GI: soft, nontender, nondistended, + BS MS: no deformity or atrophy  Skin: warm and dry, no rash Neuro:  Strength and sensation are intact Psych: euthymic mood, full affect  EKG:  EKG is  ordered today. EKG showed normal sinus rhythm with left axis deviation and minimal LVH with  Recent Labs: No results found for requested labs within last 365 days.    Lipid Panel    Component Value Date/Time   CHOL 270 (H) 04/20/2021 1545   TRIG 283 (H) 04/20/2021 1545   HDL 59 04/20/2021 1545   CHOLHDL 4.6 04/20/2021 1545   CHOLHDL 2.8 05/19/2020 0805   VLDL 18 10/28/2019 0800  LDLCALC 159 (H) 04/20/2021 1545   LDLCALC 68 05/19/2020 0805      Wt Readings from Last 3 Encounters:  04/07/22 212 lb 4 oz (96.3 kg)  03/29/22 211 lb 6.4 oz (95.9 kg)  03/25/22 213 lb 3.2 oz (96.7 kg)           04/18/2017    1:45 PM  PAD Screen  Previous PAD dx? No  Previous surgical procedure? No  Pain with walking? No  Feet/toe relief with dangling? No  Painful, non-healing ulcers? No  Extremities discolored? No      ASSESSMENT AND PLAN:  1.  Coronary artery disease involving native coronary arteries without angina: He is doing reasonably well overall with no anginal symptoms.  Continue low-dose aspirin.    2.  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  Regardless, he is not a candidate for statins especially that he had elevated liver enzymes on statins before. However, I do think he is a candidate for PCSK9 inhibitors.  I will discuss with Dr. Hilty to get his opinion.  I am concerned about his future cardiovascular events if we do not address his hyperlipidemia.  3. Type 2 diabetes: Recent and worsening of control in the setting of pregnant is on  treatment.  He was recently started on Ozempic.  4.  Intermittent palpitations and tachycardia: Outpatient monitor was unremarkable and currently with no symptoms.    Disposition:   FU in 6 months.  Signed,  Muhammad Arida, MD  04/07/2022 8:15 AM    [facility] 

## 2022-05-01 ENCOUNTER — Encounter: Payer: Self-pay | Admitting: Family Medicine

## 2022-05-02 MED ORDER — OZEMPIC (0.25 OR 0.5 MG/DOSE) 2 MG/3ML ~~LOC~~ SOPN: 0.5000 mg | PEN_INJECTOR | SUBCUTANEOUS | 0 refills | Status: DC

## 2022-05-10 ENCOUNTER — Encounter: Payer: Self-pay | Admitting: Gastroenterology

## 2022-05-14 ENCOUNTER — Other Ambulatory Visit: Payer: Self-pay | Admitting: Family Medicine

## 2022-05-14 ENCOUNTER — Other Ambulatory Visit: Payer: Self-pay | Admitting: Cardiovascular Disease

## 2022-05-14 DIAGNOSIS — R809 Proteinuria, unspecified: Secondary | ICD-10-CM

## 2022-05-14 DIAGNOSIS — E1169 Type 2 diabetes mellitus with other specified complication: Secondary | ICD-10-CM

## 2022-05-16 NOTE — Telephone Encounter (Signed)
Requested medications are due for refill today.  yes  Requested medications are on the active medications list.  yes  Last refill. 02/16/2022 #180 0 rf  Future visit scheduled.   yes  Notes to clinic.  Labs are expired.    Requested Prescriptions  Pending Prescriptions Disp Refills   omega-3 acid ethyl esters (LOVAZA) 1 g capsule [Pharmacy Med Name: Omega-3-acid Ethyl Esters 1 GM Oral Capsule] 180 capsule 0    Sig: Take 1 capsule by mouth twice daily     Endocrinology:  Nutritional Agents - omega-3 acid ethyl esters Failed - 05/14/2022  4:34 PM      Failed - Lipid Panel in normal range within the last 12 months    Cholesterol, Total  Date Value Ref Range Status  04/20/2021 270 (H) 100 - 199 mg/dL Final   LDL Cholesterol (Calc)  Date Value Ref Range Status  05/19/2020 68 mg/dL (calc) Final    Comment:    Reference range: <100 . Desirable range <100 mg/dL for primary prevention;   <70 mg/dL for patients with CHD or diabetic patients  with > or = 2 CHD risk factors. Marland Kitchen LDL-C is now calculated using the Martin-Hopkins  calculation, which is a validated novel method providing  better accuracy than the Friedewald equation in the  estimation of LDL-C.  Cresenciano Genre et al. Annamaria Helling. 5643;329(51): 2061-2068  (http://education.[employer].com/faq/FAQ164)    LDL Chol Calc (NIH)  Date Value Ref Range Status  04/20/2021 159 (H) 0 - 99 mg/dL Final   HDL  Date Value Ref Range Status  04/20/2021 59 >39 mg/dL Final   Triglycerides  Date Value Ref Range Status  04/20/2021 283 (H) 0 - 149 mg/dL Final         Passed - Valid encounter within last 12 months    Recent Outpatient Visits           1 month ago Annual physical exam   [facility] [place], Devonne Doughty, DO   7 months ago Type 2 diabetes mellitus with microalbuminuria, without long-term current use of insulin [facility])   [facility], Devonne Doughty, DO   1 year ago Type 2 diabetes  mellitus with microalbuminuria, without long-term current use of insulin [facility]   [facility], Devonne Doughty, DO   1 year ago Type 2 diabetes mellitus with microalbuminuria, without long-term current use of insulin [facility])   [facility] Olin Hauser, DO   1 year ago Elevated CK   [place], DO       Future Appointments             In 1 month Hilty, Nadean Corwin, MD [facility], [facility]   In 2 months [place], Devonne Doughty, DO [facility], [place]   In 5 months Fletcher Anon, Mertie Clause, MD [place] [place]

## 2022-05-24 ENCOUNTER — Ambulatory Visit (AMBULATORY_SURGERY_CENTER): Payer: Self-pay

## 2022-05-24 VITALS — Ht 70.0 in | Wt 211.0 lb

## 2022-05-24 DIAGNOSIS — Z1211 Encounter for screening for malignant neoplasm of colon: Secondary | ICD-10-CM

## 2022-05-24 MED ORDER — NA SULFATE-K SULFATE-MG SULF 17.5-3.13-1.6 GM/177ML PO SOLN: 1.0000 | Freq: Once | ORAL | 0 refills | Status: AC

## 2022-05-24 NOTE — Progress Notes (Signed)
No egg or soy allergy known to patient  No issues known to pt with past sedation with any surgeries or procedures Patient denies ever being told they had issues or difficulty with intubation  No FH of Malignant Hyperthermia Pt is not on Ozempic, instructed to take on Friday Hold Metformin the morning of procwedure Pt is not on  home 02  Pt is not on blood thinners  Pt denies issues with constipation  No A fib or A flutter Have any cardiac testing pending--no Pt instructed to use Singlecare.com or GoodRx for a price reduction on prep

## 2022-06-03 ENCOUNTER — Encounter: Payer: Self-pay | Admitting: Gastroenterology

## 2022-06-04 ENCOUNTER — Encounter: Payer: Self-pay | Admitting: Certified Registered Nurse Anesthetist

## 2022-06-13 ENCOUNTER — Ambulatory Visit (AMBULATORY_SURGERY_CENTER): Payer: 59 | Admitting: Gastroenterology

## 2022-06-13 ENCOUNTER — Encounter: Payer: Self-pay | Admitting: Gastroenterology

## 2022-06-13 VITALS — BP 134/95 | HR 75 | Temp 97.7°F | Resp 16 | Ht 70.0 in | Wt 211.0 lb

## 2022-06-13 DIAGNOSIS — D120 Benign neoplasm of cecum: Secondary | ICD-10-CM

## 2022-06-13 DIAGNOSIS — K635 Polyp of colon: Secondary | ICD-10-CM | POA: Diagnosis not present

## 2022-06-13 DIAGNOSIS — Z1211 Encounter for screening for malignant neoplasm of colon: Secondary | ICD-10-CM | POA: Diagnosis present

## 2022-06-13 MED ORDER — SODIUM CHLORIDE 0.9 % IV SOLN: 500.0000 mL | Freq: Once | INTRAVENOUS | Status: DC

## 2022-06-13 NOTE — Patient Instructions (Signed)
Please read handouts provided. Continue present medications. Await pathology results. Return to GI office as needed.  YOU HAD AN ENDOSCOPIC PROCEDURE TODAY AT [facility]:   Refer to the procedure report that was given to you for any specific questions about what was found during the examination.  If the procedure report does not answer your questions, please call your gastroenterologist to clarify.  If you requested that your care partner not be given the details of your procedure findings, then the procedure report has been included in a sealed envelope for you to review at your convenience later.  YOU SHOULD EXPECT: Some feelings of bloating in the abdomen. Passage of more gas than usual.  Walking can help get rid of the air that was put into your GI tract during the procedure and reduce the bloating. If you had a lower endoscopy (such as a colonoscopy or flexible sigmoidoscopy) you may notice spotting of blood in your stool or on the toilet paper. If you underwent a bowel prep for your procedure, you may not have a normal bowel movement for a few days.  Please Note:  You might notice some irritation and congestion in your nose or some drainage.  This is from the oxygen used during your procedure.  There is no need for concern and it should clear up in a day or so.  SYMPTOMS TO REPORT IMMEDIATELY:  Following lower endoscopy (colonoscopy or flexible sigmoidoscopy):  Excessive amounts of blood in the stool  Significant tenderness or worsening of abdominal pains  Swelling of the abdomen that is new, acute  Fever of 100F or higher.  For urgent or emergent issues, a gastroenterologist can be reached at any hour by calling [phone]. Do not use MyChart messaging for urgent concerns.    DIET:  We do recommend a small meal at first, but then you may proceed to your regular diet.  Drink plenty of fluids but you should avoid alcoholic beverages for 24 hours.  ACTIVITY:  You  should plan to take it easy for the rest of today and you should NOT DRIVE or use heavy machinery until tomorrow (because of the sedation medicines used during the test).    FOLLOW UP: Our staff will call the number listed on your records the next business day following your procedure.  We will call around 7:15- 8:00 am to check on you and address any questions or concerns that you may have regarding the information given to you following your procedure. If we do not reach you, we will leave a message.     If any biopsies were taken you will be contacted by phone or by letter within the next 1-3 weeks.  Please call us at [phone] if you have not heard about the biopsies in 3 weeks.    SIGNATURES/CONFIDENTIALITY: You and/or your care partner have signed paperwork which will be entered into your electronic medical record.  These signatures attest to the fact that that the information above on your After Visit Summary has been reviewed and is understood.  Full responsibility of the confidentiality of this discharge information lies with you and/or your care-partner.

## 2022-06-13 NOTE — Op Note (Signed)
[name] Procedure Date: 06/13/2022 8:23 AM MRN: [id number] Endoscopist: Gerrit Heck , MD, [id number] Age: 46 Referring MD:  Date of Birth: 04/14/1976 Gender: Male Account #: [id number] Procedure:                Colonoscopy Indications:              Screening for colorectal malignant neoplasm, This                            is the patient's first colonoscopy Medicines:                Monitored Anesthesia Care Procedure:                Pre-Anesthesia Assessment:                           - Prior to the procedure, a History and Physical                            was performed, and patient medications and                            allergies were reviewed. [name] tolerance of                            previous anesthesia was also reviewed. The risks                            and benefits of the procedure and the sedation                            options and risks were discussed with the patient.                            All questions were answered, and informed consent                            was obtained. Prior Anticoagulants: The patient has                            taken no anticoagulant or antiplatelet agents                            except for aspirin. ASA Grade Assessment: III - A                            patient with severe systemic disease. After                            reviewing the risks and benefits, the patient was                            deemed in satisfactory condition to undergo the  procedure.                           After obtaining informed consent, the colonoscope                            was passed under direct vision. Throughout the                            procedure, the patient's blood pressure, pulse, and                            oxygen saturations were monitored continuously. The                            Colonoscope was introduced through the anus and                             advanced to the the terminal ileum. The colonoscopy                            was performed without difficulty. The patient                            tolerated the procedure well. The quality of the                            bowel preparation was good. The terminal ileum,                            ileocecal valve, appendiceal orifice, and rectum                            were photographed. Scope In: 8:38:54 AM Scope Out: 8:50:23 AM Scope Withdrawal Time: 0 hours 9 minutes 49 seconds  Total Procedure Duration: 0 hours 11 minutes 29 seconds  Findings:                 The perianal and digital rectal examinations were                            normal.                           A 6 mm polyp was found in the cecum. The polyp was                            sessile. The polyp was removed with a cold snare.                            Resection and retrieval were complete. Estimated                            blood loss was minimal.  The exam was otherwise normal throughout the                            remainder of the colon.                           The retroflexed view of the distal rectum and anal                            verge was normal and showed no anal or rectal                            abnormalities.                           The terminal ileum appeared normal. Complications:            No immediate complications. Estimated Blood Loss:     Estimated blood loss was minimal. Impression:               - One 6 mm polyp in the cecum, removed with a cold                            snare. Resected and retrieved.                           - The distal rectum and anal verge are normal on                            retroflexion view.                           - The examined portion of the ileum was normal. Recommendation:           - Patient has a contact number available for                            emergencies. The signs and symptoms  of potential                            delayed complications were discussed with the                            patient. Return to normal activities tomorrow.                            Written discharge instructions were provided to the                            patient.                           - Resume previous diet.                           - Continue present medications.                           -  Await pathology results.                           - Repeat colonoscopy for surveillance based on                            pathology results.                           - Return to GI office PRN. Gerrit Heck, MD 06/13/2022 8:55:02 AM

## 2022-06-13 NOTE — Progress Notes (Signed)
Report given to PACU, vss 

## 2022-06-13 NOTE — Progress Notes (Signed)
Called to room to assist during endoscopic procedure.  Patient ID and intended procedure confirmed with present staff. Received instructions for my participation in the procedure from the performing physician.  

## 2022-06-13 NOTE — Progress Notes (Deleted)
Pt's states no medical or surgical changes since previsit or office visit. 

## 2022-06-13 NOTE — Progress Notes (Signed)
GASTROENTEROLOGY PROCEDURE H&P NOTE   Primary Care Physician: Olin Hauser, DO    Reason for Procedure:  Colon Cancer screening  Plan:    Colonoscopy  Patient is appropriate for endoscopic procedure(s) in the ambulatory ([place]) setting.  The nature of the procedure, as well as the risks, benefits, and alternatives were carefully and thoroughly reviewed with the patient. Ample time for discussion and questions allowed. The patient understood, was satisfied, and agreed to proceed.     HPI: Stephen [name] is a 46 y.o. male who presents for colonoscopy for routine Colon Cancer screening.  No active GI symptoms.  No known family history of colon cancer or related malignancy.  Patient is otherwise without complaints or active issues today.  Past Medical History:  Diagnosis Date   CAD (coronary artery disease)    a. LHC 04/2017: D2 60%, pLCx 30%, OM2 85%, OM3 100%L-L collats, m-dRCA 100% L-R collats, LVEF 55-65%; b. LHC 05/15/2017: D2 60%, pLCx 30%, OM2-1 85% s/p PCI/DES, OM2-2 30%, OM3 100% L-L collats, m-dRCA 100% L-R collats     Diabetes ([place])    History of echocardiogram    a. 04/2017: EF 55-60%, normal wall motion, normal LV diastolic function, no significant valvular abnormalities   Hyperlipidemia    Kidney stones 2002, 2008   no stone eval   Myocardial infarction ([place])    Myositis    associated wirth statins    Past Surgical History:  Procedure Laterality Date   APPENDECTOMY  2000   CARDIAC CATHETERIZATION     CORONARY STENT INTERVENTION N/A 05/15/2017   Procedure: CORONARY STENT INTERVENTION;  Surgeon: Wellington Hampshire, MD;  Location: [place] [facility];  Service: Cardiovascular;  Laterality: N/A;   LEFT HEART CATH AND CORONARY ANGIOGRAPHY Left 04/24/2017   Procedure: LEFT HEART CATH AND CORONARY ANGIOGRAPHY;  Surgeon: Wellington Hampshire, MD;  Location: [facility];  Service: Cardiovascular;  Laterality: Left;    Prior to Admission medications    Medication Sig Start Date End Date Taking? Authorizing Provider  aspirin EC 81 MG tablet Take 1 tablet (81 mg total) by mouth daily. 04/20/17  Yes Wellington Hampshire, MD  lisinopril (ZESTRIL) 2.5 MG tablet Take 1 tablet by mouth once daily 05/16/22  Yes Wellington Hampshire, MD  metFORMIN (GLUCOPHAGE-XR) 500 MG 24 hr tablet TAKE 2 TABLETS BY MOUTH ONCE DAILY WITH BREAKFAST 09/27/21  Yes Karamalegos, Devonne Doughty, DO  mycophenolate (CELLCEPT) 500 MG tablet Take 1,000 mg by mouth 2 (two) times daily. 02/17/22 02/17/23 Yes [provider]  omega-3 acid ethyl esters (LOVAZA) 1 g capsule Take 1 capsule by mouth twice daily 05/16/22  Yes Karamalegos, Devonne Doughty, DO  omeprazole (PRILOSEC) 20 MG capsule Take 1 capsule (20 mg total) by mouth daily before breakfast. 09/27/21  Yes Karamalegos, Devonne Doughty, DO  predniSONE (DELTASONE) 20 MG tablet Take 15 mg by mouth daily. 03/21/22  Yes [provider]  acetaminophen (TYLENOL) 500 MG tablet Take by mouth as needed.    [provider]  Blood Glucose Monitoring Suppl (FIFTY50 GLUCOSE METER 2.0) w/Device KIT USE 1 TEST STRIP TO CHECK GLUCOSE UP TO TWICE DAILY AS INSTRUCTED 02/01/20   [provider]  CONTOUR NEXT TEST test strip USE 1 STRIP TO CHECK GLUCOSE UP TO TWICE DAILY AS DIRECTED 07/07/21   [place], Devonne Doughty, DO  Microlet Lancets MISC USE 1 LANCET TO CHECK BLOOD SUGAR TWICE DAILY AS DIRECTED 05/10/21   [place] Ranger, Devonne Doughty, DO  nitroGLYCERIN (NITROSTAT) 0.4  MG SL tablet Place 1 tablet (0.4 mg total) under the tongue every 5 (five) minutes as needed for chest pain. Patient not taking: Reported on 05/24/2022 06/07/21   Rise Mu, PA-C  OZEMPIC, 0.25 OR 0.5 MG/DOSE, 2 MG/3ML SOPN Inject 0.5 mg into the skin once a week. Start with 0.30m weekly x 4 weeks then increase to 0.517mweekly injection. 05/02/22   BaJearld FentonNP    Current Outpatient Medications  Medication Sig Dispense Refill   aspirin EC 81 MG tablet Take 1  tablet (81 mg total) by mouth daily. 90 tablet 3   lisinopril (ZESTRIL) 2.5 MG tablet Take 1 tablet by mouth once daily 90 tablet 2   metFORMIN (GLUCOPHAGE-XR) 500 MG 24 hr tablet TAKE 2 TABLETS BY MOUTH ONCE DAILY WITH BREAKFAST 180 tablet 3   mycophenolate (CELLCEPT) 500 MG tablet Take 1,000 mg by mouth 2 (two) times daily.     omega-3 acid ethyl esters (LOVAZA) 1 g capsule Take 1 capsule by mouth twice daily 180 capsule 3   omeprazole (PRILOSEC) 20 MG capsule Take 1 capsule (20 mg total) by mouth daily before breakfast. 90 capsule 3   predniSONE (DELTASONE) 20 MG tablet Take 15 mg by mouth daily.     acetaminophen (TYLENOL) 500 MG tablet Take by mouth as needed.     Blood Glucose Monitoring Suppl (FIFTY50 GLUCOSE METER 2.0) w/Device KIT USE 1 TEST STRIP TO CHECK GLUCOSE UP TO TWICE DAILY AS INSTRUCTED     CONTOUR NEXT TEST test strip USE 1 STRIP TO CHECK GLUCOSE UP TO TWICE DAILY AS DIRECTED 200 each 0   Microlet Lancets MISC USE 1 LANCET TO CHECK BLOOD SUGAR TWICE DAILY AS DIRECTED 100 each 4   nitroGLYCERIN (NITROSTAT) 0.4 MG SL tablet Place 1 tablet (0.4 mg total) under the tongue every 5 (five) minutes as needed for chest pain. (Patient not taking: Reported on 05/24/2022) 25 tablet 1   OZEMPIC, 0.25 OR 0.5 MG/DOSE, 2 MG/3ML SOPN Inject 0.5 mg into the skin once a week. Start with 0.2569meekly x 4 weeks then increase to 0.5mg16mekly injection. 9 mL 0   Current Facility-Administered Medications  Medication Dose Route Frequency Provider Last Rate Last Admin   0.9 %  sodium chloride infusion  500 mL Intravenous Once Oluwatobi Ruppe V, DO        Allergies as of 06/13/2022 - Review Complete 06/13/2022  Allergen Reaction Noted   Statins Other (See Comments) 03/25/2022    Family History  Problem Relation Age of Onset   Heart disease Mother    Thyroid disease Mother    CAD Mother 72  50ypertension Mother    Hyperlipidemia Mother    Chronic Renal Failure Mother    Cancer Father         pancreatic s/p Whipple procedure   Diabetes Father    Cholecystitis Father    Pancreatic cancer Father    Hyperthyroidism Sister    Lung cancer Paternal Aunt    Diabetes Paternal Uncle    Stroke Paternal Uncle    Multiple sclerosis Sister    Hyperthyroidism Sister    Hyperthyroidism Sister    Diabetes Sister    Healthy Sister    Healthy Son    Healthy Son    Pancreatic cancer Paternal Uncle    Stomach cancer Paternal Aunt    Vision loss Neg Hx    Heart attack Neg Hx    Breast cancer Neg Hx    Colon  cancer Neg Hx    Prostate cancer Neg Hx    Esophageal cancer Neg Hx     Social History   Socioeconomic History   Marital status: Divorced    Spouse name: Not on file   Number of children: Not on file   Years of education: Not on file   Highest education level: Not on file  Occupational History   Not on file  Tobacco Use   Smoking status: Former    Packs/day: 0.10    Years: 3.00    Total pack years: 0.30    Types: Cigarettes    Quit date: 08/15/1994    Years since quitting: 27.8   Smokeless tobacco: Never   Tobacco comments:    weekend smoker, pt would only smoke bout 1 -2 cig on the weekend.   Vaping Use   Vaping Use: Never used  Substance and Sexual Activity   Alcohol use: Yes    Comment: on week-ends   Drug use: No   Sexual activity: Yes    Comment: preventing w/ partner contraception  Other Topics Concern   Not on file  Social History Narrative   Not on file   Social Determinants of Health   Financial Resource Strain: Not on file  Food Insecurity: Not on file  Transportation Needs: Not on file  Physical Activity: Not on file  Stress: Not on file  Social Connections: Not on file  Intimate Partner Violence: Not on file    Physical Exam: Vital signs in last 24 hours: _0  121/78   Pulse 75   Temp 97.7 F (36.5 C)   Ht 5' 10" (1.778 m)   Wt 211 lb (95.7 kg)   SpO2 98%   BMI 30.28 kg/m  GEN: NAD EYE: Sclerae anicteric ENT: MMM CV:  Non-tachycardic Pulm: CTA b/l GI: Soft, NT/ND NEURO:  Alert & Oriented x [place], DO [facility]   06/13/2022 8:24 AM

## 2022-06-14 ENCOUNTER — Telehealth: Payer: Self-pay | Admitting: *Deleted

## 2022-06-14 NOTE — Telephone Encounter (Signed)
No answer on  follow up call. Left message.   

## 2022-06-17 ENCOUNTER — Encounter: Payer: Self-pay | Admitting: Gastroenterology

## 2022-07-01 ENCOUNTER — Ambulatory Visit (HOSPITAL_BASED_OUTPATIENT_CLINIC_OR_DEPARTMENT_OTHER): Payer: 59 | Admitting: Internal Medicine

## 2022-07-27 ENCOUNTER — Encounter: Payer: Self-pay | Admitting: Family Medicine

## 2022-07-27 ENCOUNTER — Ambulatory Visit (INDEPENDENT_AMBULATORY_CARE_PROVIDER_SITE_OTHER): Payer: 59 | Admitting: Family Medicine

## 2022-07-27 VITALS — BP 124/80 | HR 77 | Ht 70.0 in | Wt 210.0 lb

## 2022-07-27 DIAGNOSIS — E1129 Type 2 diabetes mellitus with other diabetic kidney complication: Secondary | ICD-10-CM | POA: Diagnosis not present

## 2022-07-27 DIAGNOSIS — R809 Proteinuria, unspecified: Secondary | ICD-10-CM | POA: Diagnosis not present

## 2022-07-27 DIAGNOSIS — M3322 Polymyositis with myopathy: Secondary | ICD-10-CM | POA: Diagnosis not present

## 2022-07-27 LAB — HM DIABETES EYE EXAM

## 2022-07-27 LAB — POCT GLYCOSYLATED HEMOGLOBIN (HGB A1C): Hemoglobin A1C: 7.1 % — AB (ref 4.0–5.6)

## 2022-07-27 MED ORDER — OZEMPIC (1 MG/DOSE) 4 MG/3ML ~~LOC~~ SOPN: 1.0000 mg | PEN_INJECTOR | SUBCUTANEOUS | 5 refills | Status: DC

## 2022-07-27 NOTE — Progress Notes (Signed)
Subjective:    Patient ID: Stephen [name], male    DOB: [birth year], 46 y.o.   MRN: [id number]  Stephen [name] is a 46 y.o. male presenting on 07/27/2022 for Diabetes   HPI  CHRONIC DM, Type 2 / Obesity BMI >30 A1c prior >8 now improved to 7.1 CBGs: Improved Meds: Ozempic 0.'5mg'$  weekly inj, Metformin XR '500mg'$  x 2 = '1000mg'$  daily Currently on ACEi Lifestyle: - Diet off alcohol, improved diet - Exercise - unable to resume due to muscle problem - fam history diabetes Denies hypoglycemia, polyuria, visual changes, numbness or tingling.   HYPERLIPIDEMIA / Elevated LFTs / CAD PMH MI x 3 in 2018, followed by Dr Fletcher Anon Cardiology - Reports concerns with hyperlipidemia / fatty liver history - Taken off Statin therapy and started on Repatha PCKS9 inhib in 02/2020 Remains off Statin, Zetia, Repatha per Rheum   Polymyositis with myopathy Followed by [facility]umatology Dr Posey Pronto Improved on immune suppression therapy Recent dose increase by Dr Posey Pronto with dose inc Prednisone to '20mg'$  daily with upcoming infusions Off MTX and Folic Acid On Mycophenolate  Completed IV IG 7 treatments, CK reduced down to normal eventually, then he was taken off IVIG and was maintained on prednisone. CK was up to 5k + then down to 2k, then 1.2k. seems to have hit plateau. Then more recently he had increase in CK and then restarted therapy some improved CK level 1213 then back up to 1297. - He was restarted Prednisone - Concern with thin bones, DEXA, now on Alendronate, Calcium, Vit D  Next round treatment 4 weeks, follow CK trend, may consider [facility] 2nd opinion in future  He is asking about Intermittent FMLA due to missed time from work due to doctors apts, recurring infusion therapy apts monthly and episodic flare up of symptoms causing him to be unable to work and miss time.  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  Due to pain he is having difficulty concentrating and focusing on work tasks.      09/27/2021   10:17 AM 04/16/2021    8:49 AM 05/26/2020    8:14 AM  Depression screen PHQ 2/9  Decreased Interest 0 0 0  Down, Depressed, Hopeless 0 0 0  PHQ - 2 Score 0 0 0  Altered sleeping 0 0   Tired, decreased energy 0 1   Change in appetite 0 0   Feeling bad or failure about yourself  0 0   Trouble concentrating 0 0   Moving slowly or fidgety/restless 0 0   Suicidal thoughts 0 0   PHQ-9 Score 0 1   Difficult doing work/chores Not difficult at all Not difficult at all     Social History   Tobacco Use   Smoking status: Former    Packs/day: 0.10    Years: 3.00    Total pack years: 0.30    Types: Cigarettes    Quit date: 08/15/1994    Years since quitting: 27.9   Smokeless tobacco: Never   Tobacco comments:    weekend smoker, pt would only smoke bout 1 -2 cig on the weekend.   Vaping Use   Vaping Use: Never used  Substance Use Topics   Alcohol use: Yes    Comment: on week-ends  Drug use: No    Review of Systems Per HPI unless specifically indicated above     Objective:    BP 124/80 (BP Location: Left Arm, Cuff Size: Normal)   Pulse 77   Ht '5\' 10"'$  (1.778 m)   Wt 210 lb (95.3 kg)   SpO2 100%   BMI 30.13 kg/m   Wt Readings from Last 3 Encounters:  07/27/22 210 lb (95.3 kg)  06/13/22 211 lb (95.7 kg)  05/24/22 211 lb (95.7 kg)    Physical Exam Vitals and nursing note reviewed.  Constitutional:      General: He is not in acute distress.    Appearance: He is well-developed. He is not diaphoretic.     Comments: Well-appearing, comfortable, cooperative  HENT:     Head: Normocephalic and atraumatic.  Eyes:     General:        Right eye: No discharge.        Left eye: No discharge.     Conjunctiva/sclera: Conjunctivae normal.  Neck:      Thyroid: No thyromegaly.  Cardiovascular:     Rate and Rhythm: Normal rate and regular rhythm.     Pulses: Normal pulses.     Heart sounds: Normal heart sounds. No murmur heard. Pulmonary:     Effort: Pulmonary effort is normal. No respiratory distress.     Breath sounds: Normal breath sounds. No wheezing or rales.  Musculoskeletal:        General: Normal range of motion.     Cervical back: Normal range of motion and neck supple.  Lymphadenopathy:     Cervical: No cervical adenopathy.  Skin:    General: Skin is warm and dry.     Findings: No erythema or rash.  Neurological:     Mental Status: He is alert and oriented to person, place, and time. Mental status is at baseline.  Psychiatric:        Behavior: Behavior normal.     Comments: Well groomed, good eye contact, normal speech and thoughts      Recent Labs    09/27/21 0945 03/29/22 1251 07/27/22 0820  HGBA1C 7.6* 8.8* 7.1*   I have personally reviewed the radiology report from 06/15/22  DXA bone density  Anatomical Region Laterality Modality  -- -- Radiographic Imaging   Narrative  Table formatting from the original result was not included. [facility] Report Donel Hynek  REFERRING:  M. PATEL                                               TECHNOLOGIST:    Johnnye Sima HISTORY:  BASELINE BONE DENSITY. 46 YEAR OLD HISPANIC MALE ON LONG TERM SYSTEMATIC STEROIDS.   SITE DATE BMD g/cm  T-SCORE Z-SCORE g/cm CHANGE % CHANGE  STATISTICALLY               SIGNIFICANT? LUMBAR SPINE L1- L4 06-15-22 1.027 -0.6               HIP L.FEM. NECK 06-15-22 0.797 -1.0                L.TOTAL HIP 06-15-22 0.960 -0.5               FOREARM L/R 33%  INTERPRETATION:  Osteopenia/Low bone mass    FRACTURE RISK ASSESSMENT (FRAX) __0.1___  10 year risk of hip fracture.      __2.3___  10 year risk of any major fracture.  TREATMENT:  The National Osteoporosis Foundation (2014) Treatment  Guidelines for: Consider FDA-approved medical therapies in postmenopausal women and men aged 67 years and older, based on the following: A hip or vertebral (clinical or morphometric) fracture T-score = -2.5 at the femoral neck or spine after appropriate evaluation to exclude secondary causes Low bone mass (T-score between -1.0 and -2.5 at the femoral neck or spine) and a 10-year probability of a hip fracture = 3% or a 10-year probability of a major osteoporosis-related fracture = 20% based on the UA-adapted WHO algorithm Clinicians judgment and/or patient preferences may indicate treatment for people with 10-year fracture probabilities above or below these levels BMD should be monitored two years after initiating therapy and at two-year intervals thereafter.  _______________________________________ Mee Hives, M.D.          [facility] is a [facility] [employer] facility and technologist Least Significant Change [facility]): Technologist  Site LS Spine   Site Hip   Site Forearm TW    0.024 g/cm   0.030 g/cm  0.021 g/cm at 95% confidence level Exam End: 06/15/22 08:59 Last Resulted: 06/15/22 15:13  Received From: [place]  Result Received: 07/19/22 09:48     Results for orders placed or performed in visit on 07/27/22  POCT HgB A1C  Result Value Ref Range   Hemoglobin A1C 7.1 (A) 4.0 - 5.6 %      Assessment & Plan:   Problem List Items Addressed This Visit     Polymyositis with myopathy ([place])    Managed by Rheumatology, [facility] Concern with recent poor response to IVIG infusion therapy, CK levels had been reducing appropriately back on therapy from 5k to 2k then to 1.2k, then no response to last treatment.  Continues on Prednisone '5mg'$  daily  Has IV IG infusion every 4 weeks.  See FMLA intermittent below      Type 2 diabetes mellitus with microalbuminuria, without long-term current use of insulin (HCC) - Primary     A1c significantly improved control to 7.1 today polymyositis illness Complications - hyperlipidemia, CAD - increases risk of future cardiovascular complications Prednisone has raised his sugar  Plan:  Dose increase ozempic from 0.5 to '1mg'$  weekly inj, for wt / better sugar control and can reduce metformin - May reduce Metformin to XR '500mg'$  daily for now, then OFF in future weeks into '1mg'$  course on ozempic If ozempic '1mg'$  is unavailable due to back order we can do sample 0.5 for now  Encourage improved lifestyle - low carb, low sugar diet, reduce portion size, continue improving regular exercise Check CBG, bring log to next visit for review Continue ASA, ACEi      Relevant Medications   OZEMPIC, 1 MG/DOSE, 4 MG/3ML SOPN   Other Relevant Orders   POCT HgB A1C (Completed)    Intermittent FMLA Paperwork I agree with his request to pursue this due to his medical condition limiting his job function.  He will speak with HR and submit paperwork to [place] and follow-up on this topic.  Recommendations:  FMLA Intermittent  For Appointments / Recurring Treatments - 2 appointments / treatments per month, up to 2 days (for travel, apt and recovery) per apt  For episodic flare up symptoms Polymyositis myopathy - 3 episodes/flares  per month, 2 days per episode   Meds ordered this encounter  Medications   OZEMPIC, 1 MG/DOSE, 4 MG/3ML SOPN    Sig: Inject 1 mg into the skin once a week.    Dispense:  3 mL    Refill:  5    Dose change increase from 0.5 up to 1    Follow up plan: Return in about 4 weeks (around 08/24/2022) for 4 weeks or sooner for FMLA Intermittent Paperwork, / Rheum updates.  Nobie Putnam, DO [facility] 07/27/2022, 8:20 AM

## 2022-07-27 NOTE — Patient Instructions (Addendum)
Thank you for coming to the office today.  Dose increase Ozempic from 0.5 up to '1mg'$   Reduce Metformin XR 500 to 1 pill a day  Then eventually after few weeks if doing well, may discontinue  Recent Labs    09/27/21 0945 03/29/22 1251 07/27/22 0820  HGBA1C 7.6* 8.8* 7.1*   Try to get copy of FMLA paperwork, complete your portion.  Treatment 2 apts/treatment per month, up to 2 days per month  Intermittent Flare episodes 3 episodes per month, 2 days per month  Please schedule a Follow-up Appointment to: Return in about 4 weeks (around 08/24/2022) for 4 weeks or sooner for FMLA Intermittent Paperwork, / Rheum updates.  If you have any other questions or concerns, please feel free to call the office or send a message through [place]. You may also schedule an earlier appointment if necessary.  Additionally, you may be receiving a survey about your experience at our office within a few days to 1 week by e-mail or mail. We value your feedback.  Nobie Putnam, DO [place]

## 2022-07-27 NOTE — Assessment & Plan Note (Signed)
A1c significantly improved control to 7.1 today polymyositis illness Complications - hyperlipidemia, CAD - increases risk of future cardiovascular complications Prednisone has raised his sugar  Plan:  Dose increase ozempic from 0.5 to '1mg'$  weekly inj, for wt / better sugar control and can reduce metformin - May reduce Metformin to XR '500mg'$  daily for now, then OFF in future weeks into '1mg'$  course on ozempic If ozempic '1mg'$  is unavailable due to back order we can do sample 0.5 for now  Encourage improved lifestyle - low carb, low sugar diet, reduce portion size, continue improving regular exercise Check CBG, bring log to next visit for review Continue ASA, ACEi

## 2022-07-27 NOTE — Assessment & Plan Note (Signed)
Managed by Rheumatology, [facility] Concern with recent poor response to IVIG infusion therapy, CK levels had been reducing appropriately back on therapy from 5k to 2k then to 1.2k, then no response to last treatment.  Continues on Prednisone '5mg'$  daily  Has IV IG infusion every 4 weeks.  See FMLA intermittent below

## 2022-08-17 ENCOUNTER — Other Ambulatory Visit: Payer: Self-pay | Admitting: Family Medicine

## 2022-08-17 DIAGNOSIS — E1129 Type 2 diabetes mellitus with other diabetic kidney complication: Secondary | ICD-10-CM

## 2022-08-17 NOTE — Telephone Encounter (Signed)
Requested Prescriptions  Pending Prescriptions Disp Refills   CONTOUR NEXT TEST test strip [Pharmacy Med Name: Contour Next Test In Vitro Strip] 30 each 0    Sig: USE 1 STRIP TO CHECK  BLOOD SUGAR TWICE DAILY AS DIRECTED     Endocrinology: Diabetes - Testing Supplies Passed - 08/17/2022  7:07 AM      Passed - Valid encounter within last 12 months    Recent Outpatient Visits           3 weeks ago Type 2 diabetes mellitus with microalbuminuria, without long-term current use of insulin ([place])   Kinsman, DO   4 months ago Annual physical exam   [facility] Olin Hauser, DO   10 months ago Type 2 diabetes mellitus with microalbuminuria, without long-term current use of insulin [facility])   [facility], Devonne Doughty, DO   1 year ago Type 2 diabetes mellitus with microalbuminuria, without long-term current use of insulin [facility])   [facility] Olin Hauser, DO   1 year ago Type 2 diabetes mellitus with microalbuminuria, without long-term current use of insulin [facility])   [place], Devonne Doughty, DO       Future Appointments             In 1 month Fletcher Anon, Mertie Clause, MD [place]. [place]   In 2 months Hilty, Nadean Corwin, MD [facility], [facility]

## 2022-09-18 ENCOUNTER — Other Ambulatory Visit: Payer: Self-pay | Admitting: Family Medicine

## 2022-09-18 DIAGNOSIS — E1129 Type 2 diabetes mellitus with other diabetic kidney complication: Secondary | ICD-10-CM

## 2022-09-19 ENCOUNTER — Ambulatory Visit (INDEPENDENT_AMBULATORY_CARE_PROVIDER_SITE_OTHER): Payer: 59 | Admitting: Internal Medicine

## 2022-09-19 ENCOUNTER — Encounter (HOSPITAL_BASED_OUTPATIENT_CLINIC_OR_DEPARTMENT_OTHER): Payer: Self-pay | Admitting: Internal Medicine

## 2022-09-19 VITALS — BP 130/96 | HR 70 | Ht 71.0 in | Wt 213.6 lb

## 2022-09-19 DIAGNOSIS — M609 Myositis, unspecified: Secondary | ICD-10-CM

## 2022-09-19 DIAGNOSIS — T466X5D Adverse effect of antihyperlipidemic and antiarteriosclerotic drugs, subsequent encounter: Secondary | ICD-10-CM | POA: Diagnosis not present

## 2022-09-19 DIAGNOSIS — I2510 Atherosclerotic heart disease of native coronary artery without angina pectoris: Secondary | ICD-10-CM

## 2022-09-19 DIAGNOSIS — E785 Hyperlipidemia, unspecified: Secondary | ICD-10-CM | POA: Diagnosis not present

## 2022-09-19 NOTE — Progress Notes (Signed)
LIPID CLINIC CONSULT NOTE  Chief Complaint:  Statin myopathy  Primary Care Physician: Olin Hauser, DO  Primary Cardiologist:  Kathlyn Sacramento, MD  HPI:  Stephen [name] is a 47 y.o. male who is being seen today for the evaluation of myopathy at the request of Nobie Putnam *.  This is a pleasant 47 year old male with recent significant myopathy, diagnosed with inflammatory polymyositis.  He had initially been on statins because of a history of coronary artery disease with prior MI and developed significant myopathy with high CKs.  Statins were stopped more than a year ago but he has had recurrent muscle pain and high CKs.  He has been followed by rheumatology at the [facility] clinic and has been on steroids as well as methotrexate but it was noted that methotrexate was not effective.  His steroids were recently weaned and his CK is continuing to rise.  He has also had multiple treatments of IVIG.  Cholesterol has been quite high with LDL-P of 1483, HDL-P33.9 And small LDL-P of 916.  Calculated LDL was 111.  CK is mention was 2823 earlier this year but then rose up to 5121.  09/19/2022  [name] returns today for follow-up.  He has been working with his rheumatologist at [facility] but has had persistently elevated CK's.  He reports initial positive response to IVIG however recently has had no improvement.  His steroids have been weaned down to 10 mg prednisone daily.  There are for changes to immunosuppressants as well.  He still reports muscle aches mostly with more exertion.  His cholesterol previously was significantly elevated.  This has not been reassessed in the past 6 months.  PMHx:  Past Medical History:  Diagnosis Date   CAD (coronary artery disease)    a. LHC 04/2017: D2 60%, pLCx 30%, OM2 85%, OM3 100%L-L collats, m-dRCA 100% L-R collats, LVEF 55-65%; b. LHC 05/15/2017: D2 60%, pLCx 30%, OM2-1 85% s/p PCI/DES, OM2-2 30%, OM3 100% L-L collats, m-dRCA 100% L-R collats      Diabetes ([place])    History of echocardiogram    a. 04/2017: EF 55-60%, normal wall motion, normal LV diastolic function, no significant valvular abnormalities   Hyperlipidemia    Kidney stones 2002, 2008   no stone eval   Myocardial infarction ([place])    Myositis    associated wirth statins    Past Surgical History:  Procedure Laterality Date   APPENDECTOMY  2000   CARDIAC CATHETERIZATION     CORONARY STENT INTERVENTION N/A 05/15/2017   Procedure: CORONARY STENT INTERVENTION;  Surgeon: Wellington Hampshire, MD;  Location: [place] [facility];  Service: Cardiovascular;  Laterality: N/A;   LEFT HEART CATH AND CORONARY ANGIOGRAPHY Left 04/24/2017   Procedure: LEFT HEART CATH AND CORONARY ANGIOGRAPHY;  Surgeon: Wellington Hampshire, MD;  Location: [place] [facility];  Service: Cardiovascular;  Laterality: Left;    FAMHx:  Family History  Problem Relation Age of Onset   Heart disease Mother    Thyroid disease Mother    CAD Mother 46   Hypertension Mother    Hyperlipidemia Mother    Chronic Renal Failure Mother    Cancer Father        pancreatic s/p Whipple procedure   Diabetes Father    Cholecystitis Father    Pancreatic cancer Father    Hyperthyroidism Sister    Lung cancer Paternal Aunt    Diabetes Paternal Uncle    Stroke Paternal Uncle    Multiple  sclerosis Sister    Hyperthyroidism Sister    Hyperthyroidism Sister    Diabetes Sister    Healthy Sister    Healthy Son    Healthy Son    Pancreatic cancer Paternal Uncle    Stomach cancer Paternal Aunt    Vision loss Neg Hx    Heart attack Neg Hx    Breast cancer Neg Hx    Colon cancer Neg Hx    Prostate cancer Neg Hx    Esophageal cancer Neg Hx     SOCHx:   reports that he quit smoking about 28 years ago. His smoking use included cigarettes. He has a 0.30 pack-year smoking history. He has never used smokeless tobacco. He reports current alcohol use. He reports that he does not use drugs.  ALLERGIES:  Allergies   Allergen Reactions   Statins Other (See Comments)    ROS: Pertinent items noted in HPI and remainder of comprehensive ROS otherwise negative.  HOME MEDS: Current Outpatient Medications on File Prior to Visit  Medication Sig Dispense Refill   acetaminophen (TYLENOL) 500 MG tablet Take by mouth as needed.     alendronate (FOSAMAX) 35 MG tablet Take 35 mg by mouth every 7 (seven) days.     aspirin EC 81 MG tablet Take 1 tablet (81 mg total) by mouth daily. 90 tablet 3   Blood Glucose Monitoring Suppl (FIFTY50 GLUCOSE METER 2.0) w/Device KIT USE 1 TEST STRIP TO CHECK GLUCOSE UP TO TWICE DAILY AS INSTRUCTED     CONTOUR NEXT TEST test strip USE 1 STRIP TO CHECK  BLOOD SUGAR TWICE DAILY AS DIRECTED 50 each 0   lisinopril (ZESTRIL) 2.5 MG tablet Take 1 tablet by mouth once daily 90 tablet 2   metFORMIN (GLUCOPHAGE-XR) 500 MG 24 hr tablet TAKE 2 TABLETS BY MOUTH ONCE DAILY WITH BREAKFAST 180 tablet 3   Microlet Lancets MISC USE 1 LANCET TO CHECK BLOOD SUGAR TWICE DAILY AS DIRECTED 100 each 4   mycophenolate (CELLCEPT) 500 MG tablet Take 1,000 mg by mouth 2 (two) times daily.     nitroGLYCERIN (NITROSTAT) 0.4 MG SL tablet Place 1 tablet (0.4 mg total) under the tongue every 5 (five) minutes as needed for chest pain. 25 tablet 1   omega-3 acid ethyl esters (LOVAZA) 1 g capsule Take 1 capsule by mouth twice daily 180 capsule 3   omeprazole (PRILOSEC) 20 MG capsule Take 1 capsule (20 mg total) by mouth daily before breakfast. 90 capsule 3   OZEMPIC, 1 MG/DOSE, 4 MG/3ML SOPN Inject 1 mg into the skin once a week. 3 mL 5   predniSONE (DELTASONE) 10 MG tablet Take 10 mg by mouth daily with breakfast.     Vitamin D, Ergocalciferol, (DRISDOL) 1.25 MG (50000 UNIT) CAPS capsule Take 50,000 Units by mouth once a week.     No current facility-administered medications on file prior to visit.    LABS/IMAGING: No results found for this or any previous visit (from the past 48 hour(s)). No results  found.  LIPID PANEL:    Component Value Date/Time   CHOL 270 (H) 04/20/2021 1545   TRIG 283 (H) 04/20/2021 1545   HDL 59 04/20/2021 1545   CHOLHDL 4.6 04/20/2021 1545   CHOLHDL 2.8 05/19/2020 0805   VLDL 18 10/28/2019 0800   LDLCALC 159 (H) 04/20/2021 1545   LDLCALC 68 05/19/2020 0805    WEIGHTS: Wt Readings from Last 3 Encounters:  09/19/22 213 lb 9.6 oz (96.9 kg)  07/27/22 210 lb (  95.3 kg)  06/13/22 211 lb (95.7 kg)    VITALS: BP (!) 130/96   Pulse 70   Ht '5\' 11"'$  (1.803 m)   Wt 213 lb 9.6 oz (96.9 kg)   SpO2 96%   BMI 29.79 kg/m   EXAM: Deferred  EKG: Deferred  ASSESSMENT: Coronary artery disease with prior MI, goal LDL less than 55 Statin associated myositis/myopathy Polymyositis Abnormal liver enzymes  PLAN: 1.   Mr. Vilardi has had significantly elevated cholesterol and remains well above target's.  He will not be a candidate for statin therapy/polymyositis.  He may tolerate PCSK9 inhibitors and had previously trialed on Repatha had some concomitant increase in CK which may be unrelated.  Think it is reasonable to consider retrial of this.  He has no other significant therapeutic options.  We will need to monitor CKs closely through his rheumatologist.  Will repeat lipid profile now is at baseline and pursue PCI inhibitor antibody therapy.  Plan otherwise follow-up with me in about 4 to 6 months.  Pixie Casino, MD, [facility], [place] Director of the Advanced Lipid Disorders &  Cardiovascular Risk Reduction Clinic Diplomate of the American Board of Clinical Lipidology Attending Cardiologist  Direct Dial: [phone]  Fax: [phone]  Website:  [email or web address] 09/19/2022, 11:04 AM

## 2022-09-19 NOTE — Telephone Encounter (Signed)
Requested medications are due for refill today.  yes  Requested medications are on the active medications list.  yes  Last refill. 09/27/2021 #180 3 rf  Future visit scheduled.   no  Notes to clinic.  Labs are expired.    Requested Prescriptions  Pending Prescriptions Disp Refills   metFORMIN (GLUCOPHAGE-XR) 500 MG 24 hr tablet [Pharmacy Med Name: metFORMIN HCl ER 500 MG Oral Tablet Extended Release 24 Hour] 180 tablet 0    Sig: TAKE 2 TABLETS BY MOUTH ONCE DAILY WITH BREAKFAST     Endocrinology:  Diabetes - Biguanides Failed - 09/18/2022  2:56 PM      Failed - Cr in normal range and within 360 days    Creat  Date Value Ref Range Status  05/19/2020 0.58 (L) 0.60 - 1.35 mg/dL Final   Creatinine, Ser  Date Value Ref Range Status  09/10/2020 0.51 (L) 0.76 - 1.27 mg/dL Final   Creatinine, Urine  Date Value Ref Range Status  03/29/2022 33 20 - 320 mg/dL Final         Failed - eGFR in normal range and within 360 days    GFR, Est African American  Date Value Ref Range Status  05/19/2020 143 > OR = 60 mL/min/1.35m Final   GFR calc Af Amer  Date Value Ref Range Status  09/10/2020 150 >59 mL/min/1.73 Final    Comment:    **In accordance with recommendations from the NKF-ASN Task force,**   [facility] is in the process of updating its eGFR calculation to the   2021 CKD-EPI creatinine equation that estimates kidney function   without a race variable.    GFR, Est Non African American  Date Value Ref Range Status  05/19/2020 123 > OR = 60 mL/min/1.728mFinal   GFR calc non Af Amer  Date Value Ref Range Status  09/10/2020 130 >59 mL/min/1.73 Final         Failed - B12 Level in normal range and within 720 days    No results found for: "VITAMINB12"       Failed - CBC within normal limits and completed in the last 12 months    WBC  Date Value Ref Range Status  09/10/2020 5.6 3.4 - 10.8 x10E3/uL Final  05/19/2020 5.2 3.8 - 10.8 Thousand/uL Final   RBC  Date Value Ref Range  Status  09/10/2020 5.32 4.14 - 5.80 x10E6/uL Final  05/19/2020 4.95 4.20 - 5.80 Million/uL Final   Hemoglobin  Date Value Ref Range Status  09/10/2020 15.5 13.0 - 17.7 g/dL Final   Hematocrit  Date Value Ref Range Status  09/10/2020 46.0 37.5 - 51.0 % Final   MCHC  Date Value Ref Range Status  09/10/2020 33.7 31.5 - 35.7 g/dL Final  05/19/2020 33.8 32.0 - 36.0 g/dL Final   [facility] Range Status  09/10/2020 29.1 26.6 - 33.0 pg Final  05/19/2020 31.3 27.0 - 33.0 pg Final   MCV  Date Value Ref Range Status  09/10/2020 87 79 - 97 fL Final   No results found for: "PLTCOUNTKUC", "LABPLAT", "POCPLA" RDW  Date Value Ref Range Status  09/10/2020 12.3 11.6 - 15.4 % Final         Passed - HBA1C is between 0 and 7.9 and within 180 days    Hemoglobin A1C  Date Value Ref Range Status  07/27/2022 7.1 (A) 4.0 - 5.6 % Final   Hgb A1c MFr Bld  Date Value Ref Range Status  05/19/2020  6.2 (H) <5.7 % of total Hgb Final    Comment:    For someone without known diabetes, a hemoglobin  A1c value between 5.7% and 6.4% is consistent with prediabetes and should be confirmed with a  follow-up test. . For someone with known diabetes, a value <7% indicates that their diabetes is well controlled. A1c targets should be individualized based on duration of diabetes, age, comorbid conditions, and other considerations. . This assay result is consistent with an increased risk of diabetes. . Currently, no consensus exists regarding use of hemoglobin A1c for diagnosis of diabetes for children. Renella Cunas - Valid encounter within last 6 months    Recent Outpatient Visits           1 month ago Type 2 diabetes mellitus with microalbuminuria, without long-term current use of insulin [facility])   [place], DO   5 months ago Annual physical exam   [facility] Olin Hauser, DO   11  months ago Type 2 diabetes mellitus with microalbuminuria, without long-term current use of insulin [facility])   [place], Devonne Doughty, DO   1 year ago Type 2 diabetes mellitus with microalbuminuria, without long-term current use of insulin [facility])   [place], DO   1 year ago Type 2 diabetes mellitus with microalbuminuria, without long-term current use of insulin [facility])   [place], DO       Future Appointments             In 3 weeks [place] Anon, Mertie Clause, MD [place] [facility] [place]   In 6 months Hilty, Nadean Corwin, MD [place] [facility] at [facility], [place]

## 2022-09-19 NOTE — Patient Instructions (Signed)
Medication Instructions:  NO CHANGES -- will depend on lab results  *If you need a refill on your cardiac medications before your next appointment, please call your pharmacy*   Lab Work: FASTING NMR lipoprofile as soon as able   FASTING lab work before your next visit in 6 months   If you have labs (blood work) drawn today and your tests are completely normal, you will receive your results only by: [place] (if you have MyChart) OR A paper copy in the mail If you have any lab test that is abnormal or we need to change your treatment, we will call you to review the results.   Follow-Up: At [facility], you and your health needs are our priority.  As part of our continuing mission to provide you with exceptional heart care, we have created designated Provider Care Teams.  These Care Teams include your primary Cardiologist (physician) and Advanced Practice Providers (APPs -  Physician Assistants and Nurse Practitioners) who all work together to provide you with the care you need, when you need it.  We recommend signing up for the patient portal called "MyChart".  Sign up information is provided on this After Visit Summary.  MyChart is used to connect with patients for Virtual Visits (Telemedicine).  Patients are able to view lab/test results, encounter notes, upcoming appointments, etc.  Non-urgent messages can be sent to your provider as well.   To learn more about what you can do with MyChart, go to [email or web address].    Your next appointment:    6 months with Dr. Debara Pickett

## 2022-10-05 ENCOUNTER — Telehealth: Payer: Self-pay | Admitting: Internal Medicine

## 2022-10-05 LAB — NMR, LIPOPROFILE
Cholesterol, Total: 255 mg/dL — ABNORMAL HIGH (ref 100–199)
HDL Particle Number: 26 umol/L — ABNORMAL LOW (ref >=30.5)
HDL-C: 42 mg/dL (ref >39)
LDL Particle Number: 2656 nmol/L — ABNORMAL HIGH (ref <1000)
LDL Size: 20.8 nm (ref >20.5)
LDL-C (NIH Calc): 186 mg/dL — ABNORMAL HIGH (ref 0–99)
LP-IR Score: 84 — ABNORMAL HIGH (ref <=45)
Small LDL Particle Number: 1502 nmol/L — ABNORMAL HIGH (ref <=527)
Triglycerides: 144 mg/dL (ref 0–149)

## 2022-10-05 NOTE — Telephone Encounter (Signed)
Patient of Dr. Debara Pickett who needs PA for Repatha [employer] [id number], based on last office note and most recent labs.   Sent to prior auth team

## 2022-10-11 ENCOUNTER — Other Ambulatory Visit (HOSPITAL_COMMUNITY): Payer: Self-pay

## 2022-10-11 ENCOUNTER — Telehealth: Payer: Self-pay

## 2022-10-11 DIAGNOSIS — E785 Hyperlipidemia, unspecified: Secondary | ICD-10-CM

## 2022-10-11 MED ORDER — REPATHA SURECLICK 140 MG/ML ~~LOC~~ SOAJ: 1.0000 mL | SUBCUTANEOUS | 3 refills | Status: DC

## 2022-10-11 NOTE — Addendum Note (Signed)
Addended by: Fidel Levy on: 10/11/2022 04:16 PM   Modules accepted: Orders

## 2022-10-11 NOTE — Telephone Encounter (Signed)
Pharmacy Patient Advocate Encounter  Prior Authorization for REPATHA 140 MG/ML INJ  has been approved.    PA# [id number] Effective dates: 10/11/22 through 10/12/23  Karie Soda, [place] Patient Advocate Specialist Direct Number: [phone] Fax: [phone]

## 2022-10-11 NOTE — Telephone Encounter (Signed)
Pharmacy Patient Advocate Encounter   Received notification from [facility] that prior authorization for REPATHA 140 MG/ML INJ is needed.    PA submitted on 10/11/22 Key [id number] Status is pending  Karie Soda, [place] Patient Advocate Specialist Direct Number: [phone] Fax: [phone]

## 2022-10-11 NOTE — Telephone Encounter (Signed)
Rx sent to pharmacy  Update sent to patient in [place]

## 2022-10-13 ENCOUNTER — Encounter: Payer: Self-pay | Admitting: Cardiovascular Disease

## 2022-10-13 ENCOUNTER — Ambulatory Visit: Payer: 59 | Attending: Cardiovascular Disease | Admitting: Cardiovascular Disease

## 2022-10-13 VITALS — BP 120/70 | HR 73 | Ht 71.0 in | Wt 216.5 lb

## 2022-10-13 DIAGNOSIS — E782 Mixed hyperlipidemia: Secondary | ICD-10-CM

## 2022-10-13 DIAGNOSIS — I2510 Atherosclerotic heart disease of native coronary artery without angina pectoris: Secondary | ICD-10-CM

## 2022-10-13 DIAGNOSIS — R002 Palpitations: Secondary | ICD-10-CM | POA: Diagnosis not present

## 2022-10-13 DIAGNOSIS — R000 Tachycardia, unspecified: Secondary | ICD-10-CM

## 2022-10-13 NOTE — Progress Notes (Signed)
Cardiology Office Note   Date:  10/13/2022   ID:  Stephen [name], DOB 08-04-1976, MRN [id number]  PCP:  Olin Hauser, DO  Cardiologist:   Kathlyn Sacramento, MD   Chief Complaint  Patient presents with   Other    6 month f/u no complaints today. Med reviewed verbally with pt.       History of Present Illness: Stephen [name] is a 47 y.o. male who is here today for a follow-up regarding coronary artery disease.  He has known history of type 2 diabetes and hyperlipidemia.   He had unstable angina in September 2018 with abnormal nuclear stress test.  EF was normal by echo. Cardiac catheterization  showed significant underlying 2 -vessel coronary artery disease with subacute thrombotic occlusion of the mid to distal right coronary artery with left-to-right collaterals, occluded OM 3 with left to left collaterals, significant stenosis in a large OM 2 and moderate disease in second diagonal. The LAD was mildly and diffusely diseased. Ejection fraction was normal. Left ventricular end-diastolic pressure was high normal. He was initially treated medically  but had worsening angina and thus  underwent successful PCI and drug-eluting stent placement to OM2.  The RCA was left to be treated medically given the well-developed collaterals.  He had elevated liver enzymes on both atorvastatin and rosuvastatin.  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  Nonetheless, he continued to struggle with this in spite of stopping all potential medications.  He failed methotrexate and was recently placed on CellCept.  He continues to be on prednisone and receives intermittent IVIG.  His CPK seems to be now chronically elevated around 1200.  Echocardiogram in October 2022 showed normal LV systolic function with mild mitral regurgitation. Outpatient monitor in September 2022 showed sinus rhythm with rare PACs and rare PVCs and no significant arrhythmia  overall.  Has been doing well with no chest pain, shortness of breath or palpitations.  He saw Dr. Debara Pickett recently and was started on [place].  Past Medical History:  Diagnosis Date   CAD (coronary artery disease)    a. LHC 04/2017: D2 60%, pLCx 30%, OM2 85%, OM3 100%L-L collats, m-dRCA 100% L-R collats, LVEF 55-65%; b. LHC 05/15/2017: D2 60%, pLCx 30%, OM2-1 85% s/p PCI/DES, OM2-2 30%, OM3 100% L-L collats, m-dRCA 100% L-R collats     Diabetes ([place])    History of echocardiogram    a. 04/2017: EF 55-60%, normal wall motion, normal LV diastolic function, no significant valvular abnormalities   Hyperlipidemia    Kidney stones 2002, 2008   no stone eval   Myocardial infarction ([place])    Myositis    associated wirth statins    Past Surgical History:  Procedure Laterality Date   APPENDECTOMY  2000   CARDIAC CATHETERIZATION     CORONARY STENT INTERVENTION N/A 05/15/2017   Procedure: CORONARY STENT INTERVENTION;  Surgeon: Wellington Hampshire, MD;  Location: [place] [facility];  Service: Cardiovascular;  Laterality: N/A;   LEFT HEART CATH AND CORONARY ANGIOGRAPHY Left 04/24/2017   Procedure: LEFT HEART CATH AND CORONARY ANGIOGRAPHY;  Surgeon: Wellington Hampshire, MD;  Location: [place] [facility];  Service: Cardiovascular;  Laterality: Left;     Current Outpatient Medications  Medication Sig Dispense Refill   acetaminophen (TYLENOL) 500 MG tablet Take by mouth as needed.     alendronate (FOSAMAX) 35 MG tablet Take 35 mg by mouth every 7 (seven) days.     aspirin  EC 81 MG tablet Take 1 tablet (81 mg total) by mouth daily. 90 tablet 3   Blood Glucose Monitoring Suppl (FIFTY50 GLUCOSE METER 2.0) w/Device KIT USE 1 TEST STRIP TO CHECK GLUCOSE UP TO TWICE DAILY AS INSTRUCTED     CONTOUR NEXT TEST test strip USE 1 STRIP TO CHECK  BLOOD SUGAR TWICE DAILY AS DIRECTED 50 each 0   Evolocumab (REPATHA SURECLICK) [id number] MG/ML SOAJ Inject 140 mg into the skin every 14 (fourteen) days. 6 mL 3   lisinopril  (ZESTRIL) 2.5 MG tablet Take 1 tablet by mouth once daily 90 tablet 2   metFORMIN (GLUCOPHAGE-XR) 500 MG 24 hr tablet TAKE 2 TABLETS BY MOUTH ONCE DAILY WITH BREAKFAST (Patient taking differently: Take 500 mg by mouth daily with breakfast.) 180 tablet 3   Microlet Lancets MISC USE 1 LANCET TO CHECK BLOOD SUGAR TWICE DAILY AS DIRECTED 100 each 4   nitroGLYCERIN (NITROSTAT) 0.4 MG SL tablet Place 1 tablet (0.4 mg total) under the tongue every 5 (five) minutes as needed for chest pain. 25 tablet 1   omega-3 acid ethyl esters (LOVAZA) 1 g capsule Take 1 capsule by mouth twice daily 180 capsule 3   omeprazole (PRILOSEC) 20 MG capsule Take 1 capsule (20 mg total) by mouth daily before breakfast. 90 capsule 3   OZEMPIC, 1 MG/DOSE, 4 MG/3ML SOPN Inject 1 mg into the skin once a week. 3 mL 5   predniSONE (DELTASONE) 10 MG tablet Take 10 mg by mouth daily with breakfast.     Vitamin D, Ergocalciferol, (DRISDOL) 1.25 MG (50000 UNIT) CAPS capsule Take 50,000 Units by mouth once a week.     No current facility-administered medications for this visit.    Allergies:   Statins    Social History:  The patient  reports that he quit smoking about 28 years ago. His smoking use included cigarettes. He has a 0.30 pack-year smoking history. He has never used smokeless tobacco. He reports current alcohol use. He reports that he does not use drugs.   Family History:  [name] family history includes CAD (age of onset: 8) in his mother; Cancer in his father; Cholecystitis in his father; Chronic Renal Failure in his mother; Diabetes in his father, paternal uncle, and sister; Healthy in his sister, son, and son; Heart disease in his mother; Hyperlipidemia in his mother; Hypertension in his mother; Hyperthyroidism in his sister, sister, and sister; Lung cancer in his paternal aunt; Multiple sclerosis in his sister; Pancreatic cancer in his father and paternal uncle; Stomach cancer in his paternal aunt; Stroke in his  paternal uncle; Thyroid disease in his mother.    ROS:  Please see the history of present illness.   Otherwise, review of systems are positive for none.   All other systems are reviewed and negative.    PHYSICAL EXAM: VS:  BP 120/70 (BP Location: Left Arm, Patient Position: Sitting, Cuff Size: Normal)   Pulse 73   Ht '5\' 11"'$  (1.803 m)   Wt 216 lb 8 oz (98.2 kg)   SpO2 98%   BMI 30.20 kg/m  , BMI Body mass index is 30.2 kg/m. GEN: Well nourished, well developed, in no acute distress  HEENT: normal  Neck: no JVD, carotid bruits, or masses Cardiac: RRR; no murmurs, rubs, or gallops,no edema  Respiratory:  clear to auscultation bilaterally, normal work of breathing GI: soft, nontender, nondistended, + BS MS: no deformity or atrophy  Skin: warm and dry, no rash Neuro:  Strength  and sensation are intact Psych: euthymic mood, full affect  EKG:  EKG is  ordered today. EKG showed normal sinus rhythm with minimal LVH.  Recent Labs: No results found for requested labs within last 365 days.    Lipid Panel    Component Value Date/Time   CHOL 270 (H) 04/20/2021 1545   TRIG 283 (H) 04/20/2021 1545   HDL 59 04/20/2021 1545   CHOLHDL 4.6 04/20/2021 1545   CHOLHDL 2.8 05/19/2020 0805   VLDL 18 10/28/2019 0800   LDLCALC 159 (H) 04/20/2021 1545   LDLCALC 68 05/19/2020 0805      Wt Readings from Last 3 Encounters:  10/13/22 216 lb 8 oz (98.2 kg)  09/19/22 213 lb 9.6 oz (96.9 kg)  07/27/22 210 lb (95.3 kg)           04/18/2017    1:45 PM  PAD Screen  Previous PAD dx? No  Previous surgical procedure? No  Pain with walking? No  Feet/toe relief with dangling? No  Painful, non-healing ulcers? No  Extremities discolored? No      ASSESSMENT AND PLAN:  1.  Coronary artery disease involving native coronary arteries without angina: He is doing reasonably well overall with no anginal symptoms.  Continue low-dose aspirin.    2.  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  Regardless, he is not a candidate for statins especially that he had elevated liver enzymes on statins before. He was recently started on Repatha and hopefully this will get his LDL down.  He has a follow-up appointment scheduled with Dr. Duwayne Heck.  3. Type 2 diabetes: Hemoglobin A1c improved to 7.1 with Ozempic.  4.  Intermittent palpitations and tachycardia: Outpatient monitor was unremarkable and currently with no symptoms.    Disposition:   FU in 12 months.  Signed,  Kathlyn Sacramento, MD  10/13/2022 8:03 AM    [place]

## 2022-10-13 NOTE — Patient Instructions (Signed)
Medication Instructions:  No changes *If you need a refill on your cardiac medications before your next appointment, please call your pharmacy*   Lab Work: None ordered If you have labs (blood work) drawn today and your tests are completely normal, you will receive your results only by: [place] (if you have MyChart) OR A paper copy in the mail If you have any lab test that is abnormal or we need to change your treatment, we will call you to review the results.   Testing/Procedures: None ordered   Follow-Up: At [facility], you and your health needs are our priority.  As part of our continuing mission to provide you with exceptional heart care, we have created designated Provider Care Teams.  These Care Teams include your primary Cardiologist (physician) and Advanced Practice Providers (APPs -  Physician Assistants and Nurse Practitioners) who all work together to provide you with the care you need, when you need it.  We recommend signing up for the patient portal called "MyChart".  Sign up information is provided on this After Visit Summary.  MyChart is used to connect with patients for Virtual Visits (Telemedicine).  Patients are able to view lab/test results, encounter notes, upcoming appointments, etc.  Non-urgent messages can be sent to your provider as well.   To learn more about what you can do with MyChart, go to [email or web address].    Your next appointment:   12 month(s)  Provider:   You may see Kathlyn Sacramento, MD or one of the following Advanced Practice Providers on your designated Care Team:   Murray Hodgkins, NP Christell Faith, PA-C [place] Kathlen Mody, PA-C Gerrie Nordmann, NP

## 2022-10-21 ENCOUNTER — Ambulatory Visit (HOSPITAL_BASED_OUTPATIENT_CLINIC_OR_DEPARTMENT_OTHER): Payer: 59 | Admitting: Internal Medicine

## 2022-12-21 LAB — COMPREHENSIVE METABOLIC PANEL: eGFR: 114

## 2022-12-21 LAB — BASIC METABOLIC PANEL: Creatinine: 0.7 (ref 0.6–1.3)

## 2023-01-04 ENCOUNTER — Other Ambulatory Visit: Payer: Self-pay | Admitting: Family Medicine

## 2023-01-04 DIAGNOSIS — E1129 Type 2 diabetes mellitus with other diabetic kidney complication: Secondary | ICD-10-CM

## 2023-01-05 NOTE — Telephone Encounter (Signed)
Requested Prescriptions  Pending Prescriptions Disp Refills   OZEMPIC, 1 MG/DOSE, 4 MG/3ML SOPN [Pharmacy Med Name: Ozempic (1 MG/DOSE) 4 MG/3ML Subcutaneous Solution Pen-injector] 3 mL 0    Sig: INJECT 1 MG  SUBCUTANEOUSLY ONCE A WEEK     Endocrinology:  Diabetes - GLP-1 Receptor Agonists - semaglutide Failed - 01/04/2023 11:14 PM      Failed - HBA1C in normal range and within 180 days    Hemoglobin A1C  Date Value Ref Range Status  07/27/2022 7.1 (A) 4.0 - 5.6 % Final   Hgb A1c MFr Bld  Date Value Ref Range Status  05/19/2020 6.2 (H) <5.7 % of total Hgb Final    Comment:    For someone without known diabetes, a hemoglobin  A1c value between 5.7% and 6.4% is consistent with prediabetes and should be confirmed with a  follow-up test. . For someone with known diabetes, a value <7% indicates that their diabetes is well controlled. A1c targets should be individualized based on duration of diabetes, age, comorbid conditions, and other considerations. . This assay result is consistent with an increased risk of diabetes. . Currently, no consensus exists regarding use of hemoglobin A1c for diagnosis of diabetes for children. .          Failed - Cr in normal range and within 360 days    Creat  Date Value Ref Range Status  05/19/2020 0.58 (L) 0.60 - 1.35 mg/dL Final   Creatinine, Ser  Date Value Ref Range Status  09/10/2020 0.51 (L) 0.76 - 1.27 mg/dL Final   Creatinine, Urine  Date Value Ref Range Status  03/29/2022 33 20 - 320 mg/dL Final         Passed - Valid encounter within last 6 months    Recent Outpatient Visits           5 months ago Type 2 diabetes mellitus with microalbuminuria, without long-term current use of insulin [facility])   [place] [facility] [place], Netta Neat, DO   9 months ago Annual physical exam   [place] [facility] Smitty Cords, DO   1 year ago Type 2 diabetes mellitus with  microalbuminuria, without long-term current use of insulin [facility])   [place] [facility] [place], Netta Neat, DO   1 year ago Type 2 diabetes mellitus with microalbuminuria, without long-term current use of insulin [facility])   [place] [facility] [place], Netta Neat, DO   2 years ago Type 2 diabetes mellitus with microalbuminuria, without long-term current use of insulin [facility])   [place] [facility] [place], Netta Neat, DO       Future Appointments             In 2 months Hilty, Lisette Abu, MD  Heart & Vascular at [facility], [place]

## 2023-02-01 ENCOUNTER — Other Ambulatory Visit: Payer: Self-pay | Admitting: Family Medicine

## 2023-02-01 DIAGNOSIS — E1129 Type 2 diabetes mellitus with other diabetic kidney complication: Secondary | ICD-10-CM

## 2023-02-01 NOTE — Telephone Encounter (Signed)
Attempted to contact patient- left message to call office for appointment- courtesy refill 1 month given Requested Prescriptions  Pending Prescriptions Disp Refills   OZEMPIC, 1 MG/DOSE, 4 MG/3ML SOPN [Pharmacy Med Name: Ozempic (1 MG/DOSE) 4 MG/3ML Subcutaneous Solution Pen-injector] 3 mL 0    Sig: INJECT 1 MG SUBCUTANEOUSLY ONCE A WEEK     Endocrinology:  Diabetes - GLP-1 Receptor Agonists - semaglutide Failed - 02/01/2023  9:39 AM      Failed - HBA1C in normal range and within 180 days    Hemoglobin A1C  Date Value Ref Range Status  07/27/2022 7.1 (A) 4.0 - 5.6 % Final   Hgb A1c MFr Bld  Date Value Ref Range Status  05/19/2020 6.2 (H) <5.7 % of total Hgb Final    Comment:    For someone without known diabetes, a hemoglobin  A1c value between 5.7% and 6.4% is consistent with prediabetes and should be confirmed with a  follow-up test. . For someone with known diabetes, a value <7% indicates that their diabetes is well controlled. A1c targets should be individualized based on duration of diabetes, age, comorbid conditions, and other considerations. . This assay result is consistent with an increased risk of diabetes. . Currently, no consensus exists regarding use of hemoglobin A1c for diagnosis of diabetes for children. .          Failed - Cr in normal range and within 360 days    Creat  Date Value Ref Range Status  05/19/2020 0.58 (L) 0.60 - 1.35 mg/dL Final   Creatinine, Ser  Date Value Ref Range Status  09/10/2020 0.51 (L) 0.76 - 1.27 mg/dL Final   Creatinine, Urine  Date Value Ref Range Status  03/29/2022 33 20 - 320 mg/dL Final         Failed - Valid encounter within last 6 months    Recent Outpatient Visits           6 months ago Type 2 diabetes mellitus with microalbuminuria, without long-term current use of insulin [facility])   [place] [facility] [place], Netta Neat, DO   10 months ago Annual physical exam   [place] [facility] Smitty Cords, DO   1 year ago Type 2 diabetes mellitus with microalbuminuria, without long-term current use of insulin [facility])   [place] [facility] [place], Netta Neat, DO   1 year ago Type 2 diabetes mellitus with microalbuminuria, without long-term current use of insulin [facility])   [place] [facility] [place], Netta Neat, DO   2 years ago Type 2 diabetes mellitus with microalbuminuria, without long-term current use of insulin [facility])   [place] [facility] [place], Netta Neat, DO       Future Appointments             In 1 month Hilty, Lisette Abu, MD [place] [facility] at [facility], [place]

## 2023-02-14 ENCOUNTER — Ambulatory Visit (INDEPENDENT_AMBULATORY_CARE_PROVIDER_SITE_OTHER): Payer: 59 | Admitting: Family Medicine

## 2023-02-14 ENCOUNTER — Encounter: Payer: Self-pay | Admitting: Family Medicine

## 2023-02-14 VITALS — BP 112/70 | HR 74 | Temp 98.6°F | Resp 18 | Ht 71.0 in | Wt 210.8 lb

## 2023-02-14 DIAGNOSIS — E663 Overweight: Secondary | ICD-10-CM

## 2023-02-14 DIAGNOSIS — R809 Proteinuria, unspecified: Secondary | ICD-10-CM | POA: Diagnosis not present

## 2023-02-14 DIAGNOSIS — M3322 Polymyositis with myopathy: Secondary | ICD-10-CM | POA: Diagnosis not present

## 2023-02-14 DIAGNOSIS — E1129 Type 2 diabetes mellitus with other diabetic kidney complication: Secondary | ICD-10-CM | POA: Diagnosis not present

## 2023-02-14 DIAGNOSIS — E785 Hyperlipidemia, unspecified: Secondary | ICD-10-CM

## 2023-02-14 DIAGNOSIS — E1169 Type 2 diabetes mellitus with other specified complication: Secondary | ICD-10-CM

## 2023-02-14 LAB — POCT GLYCOSYLATED HEMOGLOBIN (HGB A1C): Hemoglobin A1C: 7.6 % — AB (ref 4.0–5.6)

## 2023-02-14 MED ORDER — OZEMPIC (1 MG/DOSE) 4 MG/3ML ~~LOC~~ SOPN
1.0000 mg | PEN_INJECTOR | SUBCUTANEOUS | 1 refills | Status: DC
Start: 2023-02-14 — End: 2023-06-27

## 2023-02-14 NOTE — Assessment & Plan Note (Signed)
Managed by Rheumatology, [facility] Previously on IV IG, other medications Methotrexate On Prednisone taper now dose was recently increased

## 2023-02-14 NOTE — Progress Notes (Signed)
Subjective:    Patient ID: Stephen [name], male    DOB: 1976/05/13, 47 y.o.   MRN: [id number]  Stephen [name] is a 47 y.o. male presenting on 02/14/2023 for Diabetes   HPI  CHRONIC DM, Type 2 / BMI >29 A1c 7.6% (prior range 7 to 8+) CBGs: Improved Meds: Ozempic 1mg  weekly inj, Metformin XR 500mg  daily Currently on ACEi Lifestyle: - Diet off alcohol, improved diet - Exercise - unable to resume due to muscle problem - fam history diabetes Denies hypoglycemia, polyuria, visual changes, numbness or tingling.   HYPERLIPIDEMIA / Elevated LFTs / CAD PMH MI x 3 in 2018, followed by Dr Kirke Corin Cardiology - Reports concerns with hyperlipidemia / fatty liver history Remains off Statin, Zetia Followed by Lipid Specialist Cardiology Dr Rennis Golden He has been restarted on Repatha PCSK9, 10/11/22, per Dr Rennis Golden Cardiology Now on med for 4 months doing well, upcoming labs   Polymyositis with myopathy Updates, he said treatments not as effective he felt bad and weak and aching. He was dose increased up to 40mg  Prednisone around April and felt better, then tapered for period of time then tapered down to 10mg  now. Still feeling better - [facility] Rheum referred him to [facility] Rheumatology for 2nd opinion, waiting on apt - He has apt with [facility] on 03/01/23 On medication regimen and prednisone chronic   He has benefit from intermittent FMLA due to missed time from work due to doctors apts, recurring infusion therapy apts monthly and episodic flare up of symptoms causing him to be unable to work and miss time.   He is a Production designer, theatre/television/film, primarily job description is working on Nature conservation officer / communicating with team members, he is unable to work on Animator and function due to headache and body aches and pains with remaining in fixed position and using computer looking at screen.  Due to pain he is having difficulty concentrating and focusing on work tasks.       02/14/2023    3:17 PM 09/27/2021   10:17 AM 04/16/2021    8:49  AM  Depression screen PHQ 2/9  Decreased Interest 0 0 0  Down, Depressed, Hopeless 0 0 0  PHQ - 2 Score 0 0 0  Altered sleeping 0 0 0  Tired, decreased energy 0 0 1  Change in appetite 0 0 0  Feeling bad or failure about yourself  0 0 0  Trouble concentrating 0 0 0  Moving slowly or fidgety/restless 0 0 0  Suicidal thoughts 0 0 0  PHQ-9 Score 0 0 1  Difficult doing work/chores Not difficult at all Not difficult at all Not difficult at all    Social History   Tobacco Use   Smoking status: Former    Packs/day: 0.10    Years: 3.00    Additional pack years: 0.00    Total pack years: 0.30    Types: Cigarettes    Quit date: 08/15/1994    Years since quitting: 28.5   Smokeless tobacco: Never   Tobacco comments:    weekend smoker, pt would only smoke bout 1 -2 cig on the weekend.   Vaping Use   Vaping Use: Never used  Substance Use Topics   Alcohol use: Yes    Comment: on week-ends   Drug use: No    Review of Systems Per HPI unless specifically indicated above     Objective:    BP 112/70 (BP Location: Left Arm, Patient Position: Sitting, Cuff Size: Normal)  Pulse 74   Temp 98.6 F (37 C) (Oral)   Resp 18   Ht 5\' 11"  (1.803 m)   Wt 210 lb 12.8 oz (95.6 kg)   SpO2 96%   BMI 29.40 kg/m   Wt Readings from Last 3 Encounters:  02/14/23 210 lb 12.8 oz (95.6 kg)  10/13/22 216 lb 8 oz (98.2 kg)  09/19/22 213 lb 9.6 oz (96.9 kg)    Physical Exam Vitals and nursing note reviewed.  Constitutional:      General: He is not in acute distress.    Appearance: He is well-developed. He is obese. He is not diaphoretic.     Comments: Well-appearing, comfortable, cooperative  HENT:     Head: Normocephalic and atraumatic.  Eyes:     General:        Right eye: No discharge.        Left eye: No discharge.     Conjunctiva/sclera: Conjunctivae normal.  Neck:     Thyroid: No thyromegaly.  Cardiovascular:     Rate and Rhythm: Normal rate and regular rhythm.     Pulses: Normal  pulses.     Heart sounds: Normal heart sounds. No murmur heard. Pulmonary:     Effort: Pulmonary effort is normal. No respiratory distress.     Breath sounds: Normal breath sounds. No wheezing or rales.  Musculoskeletal:        General: Normal range of motion.     Cervical back: Normal range of motion and neck supple.  Lymphadenopathy:     Cervical: No cervical adenopathy.  Skin:    General: Skin is warm and dry.     Findings: No erythema or rash.  Neurological:     Mental Status: He is alert and oriented to person, place, and time. Mental status is at baseline.  Psychiatric:        Behavior: Behavior normal.     Comments: Well groomed, good eye contact, normal speech and thoughts    Results for orders placed or performed in visit on 02/14/23  Basic metabolic panel  Result Value Ref Range   Creatinine 0.7 0.6 - 1.3  Comprehensive metabolic panel  Result Value Ref Range   eGFR 114   POCT glycosylated hemoglobin (Hb A1C)  Result Value Ref Range   Hemoglobin A1C 7.6 (A) 4.0 - 5.6 %   Recent Labs    03/29/22 1251 07/27/22 0820 02/14/23 1522  HGBA1C 8.8* 7.1* 7.6*       Assessment & Plan:   Problem List Items Addressed This Visit     Hyperlipidemia associated with type 2 diabetes mellitus (HCC)    Due for lab soon, by Cardiology lipid clinic Back on Repatha PCSK9 CAD s/p MI, DM2 Off statins due to intolerance (atorvastatin, rosuvastatin), also prior on lovaza, zetia  Plan: Continue Repatha injections Continue ASA 81mg  for secondary ASCVD risk reduction Encourage improved lifestyle - low carb/cholesterol, reduce portion size, continue improving regular exercise      Relevant Medications   OZEMPIC, 1 MG/DOSE, 4 MG/3ML SOPN   Overweight (BMI 25.0-29.9)   Polymyositis with myopathy (HCC)    Managed by Rheumatology, [facility] Previously on IV IG, other medications Methotrexate On Prednisone taper now dose was recently increased      Type 2 diabetes  mellitus with microalbuminuria, without long-term current use of insulin (HCC) - Primary    A1c up to 7.6 today prior 7.1, overall still improved from 8.8 range He has polymyositis illness Complications - hyperlipidemia, CAD -  increases risk of future cardiovascular complications Prednisone has raised his sugar  Plan:  Continue Ozempic 1mg  weekly inj, new order 90 day supply, we discussed option of dose inc to Ozempic 2mg  in future vs Mounjaro as indicated May consider further reduction of Metformin in future, for now keep Metformin XR 500mg  daily  Encourage improved lifestyle - low carb, low sugar diet, reduce portion size, continue improving regular exercise Check CBG, bring log to next visit for review Continue ASA, ACEi      Relevant Medications   OZEMPIC, 1 MG/DOSE, 4 MG/3ML SOPN   Other Relevant Orders   POCT glycosylated hemoglobin (Hb A1C) (Completed)      Meds ordered this encounter  Medications   OZEMPIC, 1 MG/DOSE, 4 MG/3ML SOPN    Sig: Inject 1 mg as directed once a week.    Dispense:  9 mL    Refill:  1    Add refills for future. And 90 day      Follow up plan: Return in about 4 months (around 06/17/2023) for 4 month DM, HLD, Rheum updates, labs after.   Saralyn Pilar, DO [facility] 02/14/2023, 3:17 PM

## 2023-02-14 NOTE — Patient Instructions (Addendum)
Thank you for coming to the office today.  Recent Labs    03/29/22 1251 07/27/22 0820 02/14/23 1522  HGBA1C 8.8* 7.1* 7.6*   Renew Ozempic 1mg  weekly, okay to keep titrating down on Metformin, okay to shift towards once daily in future.  We can reconsider Ozempic 2mg .  Keep on Repatha, awaiting Dr Rennis Golden and new lab.  Keep working with Dr Allena Katz and upcoming [facility] Referral.   DUE for FASTING BLOOD WORK (no food or drink after midnight before the lab appointment, only water or coffee without cream/sugar on the morning of)  SCHEDULE "Lab Only" visit in the morning at the clinic for lab draw in 4 MONTHS   - Make sure Lab Only appointment is at about 1 week before your next appointment, so that results will be available  For Lab Results, once available within 2-3 days of blood draw, you can can log in to MyChart online to view your results and a brief explanation. Also, we can discuss results at next follow-up visit.   Please schedule a Follow-up Appointment to: Return in about 4 months (around 06/17/2023) for 4 month DM, HLD, Rheum updates, labs after.  If you have any other questions or concerns, please feel free to call the office or send a message through MyChart. You may also schedule an earlier appointment if necessary.  Additionally, you may be receiving a survey about your experience at our office within a few days to 1 week by e-mail or mail. We value your feedback.  Saralyn Pilar, DO [facility], [place]

## 2023-02-14 NOTE — Assessment & Plan Note (Signed)
Due for lab soon, by Cardiology lipid clinic Back on Repatha PCSK9 CAD s/p MI, DM2 Off statins due to intolerance (atorvastatin, rosuvastatin), also prior on lovaza, zetia  Plan: Continue Repatha injections Continue ASA 81mg  for secondary ASCVD risk reduction Encourage improved lifestyle - low carb/cholesterol, reduce portion size, continue improving regular exercise

## 2023-02-14 NOTE — Assessment & Plan Note (Signed)
A1c up to 7.6 today prior 7.1, overall still improved from 8.8 range He has polymyositis illness Complications - hyperlipidemia, CAD - increases risk of future cardiovascular complications Prednisone has raised his sugar  Plan:  Continue Ozempic 1mg  weekly inj, new order 90 day supply, we discussed option of dose inc to Ozempic 2mg  in future vs Mounjaro as indicated May consider further reduction of Metformin in future, for now keep Metformin XR 500mg  daily  Encourage improved lifestyle - low carb, low sugar diet, reduce portion size, continue improving regular exercise Check CBG, bring log to next visit for review Continue ASA, ACEi

## 2023-03-24 ENCOUNTER — Encounter (HOSPITAL_BASED_OUTPATIENT_CLINIC_OR_DEPARTMENT_OTHER): Payer: Self-pay | Admitting: Internal Medicine

## 2023-03-24 ENCOUNTER — Ambulatory Visit (INDEPENDENT_AMBULATORY_CARE_PROVIDER_SITE_OTHER): Payer: 59 | Admitting: Internal Medicine

## 2023-03-24 VITALS — BP 130/87 | HR 72 | Ht 71.0 in | Wt 212.6 lb

## 2023-03-24 DIAGNOSIS — M609 Myositis, unspecified: Secondary | ICD-10-CM

## 2023-03-24 DIAGNOSIS — T466X5D Adverse effect of antihyperlipidemic and antiarteriosclerotic drugs, subsequent encounter: Secondary | ICD-10-CM

## 2023-03-24 DIAGNOSIS — I2510 Atherosclerotic heart disease of native coronary artery without angina pectoris: Secondary | ICD-10-CM

## 2023-03-24 DIAGNOSIS — E785 Hyperlipidemia, unspecified: Secondary | ICD-10-CM | POA: Diagnosis not present

## 2023-03-24 NOTE — Patient Instructions (Signed)
Medication Instructions:  NO CHANGES  *If you need a refill on your cardiac medications before your next appointment, please call your pharmacy*   Lab Work: FASTING NMR lipoprofile in 6 months ** complete about one week before next visit  If you have labs (blood work) drawn today and your tests are completely normal, you will receive your results only by: MyChart Message (if you have MyChart) OR A paper copy in the mail If you have any lab test that is abnormal or we need to change your treatment, we will call you to review the results.    Follow-Up: At [facility], you and your health needs are our priority.  As part of our continuing mission to provide you with exceptional heart care, we have created designated Provider Care Teams.  These Care Teams include your primary Cardiologist (physician) and Advanced Practice Providers (APPs -  Physician Assistants and Nurse Practitioners) who all work together to provide you with the care you need, when you need it.  We recommend signing up for the patient portal called "MyChart".  Sign up information is provided on this After Visit Summary.  MyChart is used to connect with patients for Virtual Visits (Telemedicine).  Patients are able to view lab/test results, encounter notes, upcoming appointments, etc.  Non-urgent messages can be sent to your provider as well.   To learn more about what you can do with MyChart, go to [email or web address].    Your next appointment:    6 months with Dr. Rennis Golden

## 2023-03-24 NOTE — Progress Notes (Signed)
LIPID CLINIC CONSULT NOTE  Chief Complaint:  Statin myopathy  Primary Care Physician: Smitty Cords, DO  Primary Cardiologist:  Lorine Bears, MD  HPI:  Stephen [name] is a 47 y.o. male who is being seen today for the evaluation of myopathy at the request of Saralyn Pilar *.  This is a pleasant 47 year old male with recent significant myopathy, diagnosed with inflammatory polymyositis.  He had initially been on statins because of a history of coronary artery disease with prior MI and developed significant myopathy with high CKs.  Statins were stopped more than a year ago but he has had recurrent muscle pain and high CKs.  He has been followed by rheumatology at the [facility] clinic and has been on steroids as well as methotrexate but it was noted that methotrexate was not effective.  His steroids were recently weaned and his CK is continuing to rise.  He has also had multiple treatments of IVIG.  Cholesterol has been quite high with LDL-P of 1483, HDL-P33.9 And small LDL-P of 916.  Calculated LDL was 111.  CK is mention was 2823 earlier this year but then rose up to 5121.  09/19/2022  Stephen [name] returns today for follow-up.  He has been working with his rheumatologist at [facility] but has had persistently elevated CK's.  He reports initial positive response to IVIG however recently has had no improvement.  His steroids have been weaned down to 10 mg prednisone daily.  There are for changes to immunosuppressants as well.  He still reports muscle aches mostly with more exertion.  His cholesterol previously was significantly elevated.  This has not been reassessed in the past 6 months.  03/24/2023  Stephen [name] is seen today in follow-up.  He has had a remarkable response recently to the addition of Repatha.  His LDL particle numbers come down from over 3500 about a year ago to 518, LDL-C is now 41, but was as high as 270.  HDL 46 and triglycerides 194.  Small LDL particle number is 316.   He seems to be tolerating the medicines well.  His ALT was in the 70s but much lower than it has been in the past.  CK still is elevated in the 1600s but seems to be more related to polymyositis.  He has been on higher dose steroids but is tapered back to 10 mg daily.  He has a minimally elevated sed rate at 26 recently.  He tells me he will be referred to [facility] for further recommendations.  PMHx:  Past Medical History:  Diagnosis Date   CAD (coronary artery disease)    a. LHC 04/2017: D2 60%, pLCx 30%, OM2 85%, OM3 100%L-L collats, m-dRCA 100% L-R collats, LVEF 55-65%; b. LHC 05/15/2017: D2 60%, pLCx 30%, OM2-1 85% s/p PCI/DES, OM2-2 30%, OM3 100% L-L collats, m-dRCA 100% L-R collats     Diabetes (HCC)    History of echocardiogram    a. 04/2017: EF 55-60%, normal wall motion, normal LV diastolic function, no significant valvular abnormalities   Hyperlipidemia    Kidney stones 2002, 2008   no stone eval   Myocardial infarction (HCC)    Myositis    associated wirth statins    Past Surgical History:  Procedure Laterality Date   APPENDECTOMY  2000   CARDIAC CATHETERIZATION     CORONARY STENT INTERVENTION N/A 05/15/2017   Procedure: CORONARY STENT INTERVENTION;  Surgeon: Iran Ouch, MD;  Location: [facility] INVASIVE CV LAB;  Service: Cardiovascular;  Laterality: N/A;   LEFT HEART CATH AND CORONARY ANGIOGRAPHY Left 04/24/2017   Procedure: LEFT HEART CATH AND CORONARY ANGIOGRAPHY;  Surgeon: Iran Ouch, MD;  Location: [facility] INVASIVE CV LAB;  Service: Cardiovascular;  Laterality: Left;    FAMHx:  Family History  Problem Relation Age of Onset   Heart disease Mother    Thyroid disease Mother    CAD Mother 23   Hypertension Mother    Hyperlipidemia Mother    Chronic Renal Failure Mother    Cancer Father        pancreatic s/p Whipple procedure   Diabetes Father    Cholecystitis Father    Pancreatic cancer Father    Hyperthyroidism Sister    Lung cancer Paternal Aunt    Diabetes  Paternal Uncle    Stroke Paternal Uncle    Multiple sclerosis Sister    Hyperthyroidism Sister    Hyperthyroidism Sister    Diabetes Sister    Healthy Sister    Healthy Son    Healthy Son    Pancreatic cancer Paternal Uncle    Stomach cancer Paternal Aunt    Vision loss Neg Hx    Heart attack Neg Hx    Breast cancer Neg Hx    Colon cancer Neg Hx    Prostate cancer Neg Hx    Esophageal cancer Neg Hx     SOCHx:   reports that he quit smoking about 28 years ago. His smoking use included cigarettes. He started smoking about 31 years ago. He has a 0.3 pack-year smoking history. He has never used smokeless tobacco. He reports current alcohol use. He reports that he does not use drugs.  ALLERGIES:  Allergies  Allergen Reactions   Statins Other (See Comments)    ROS: Pertinent items noted in HPI and remainder of comprehensive ROS otherwise negative.  HOME MEDS: Current Outpatient Medications on File Prior to Visit  Medication Sig Dispense Refill   acetaminophen (TYLENOL) 500 MG tablet Take by mouth as needed.     alendronate (FOSAMAX) 35 MG tablet Take 35 mg by mouth every 7 (seven) days.     aspirin EC 81 MG tablet Take 1 tablet (81 mg total) by mouth daily. 90 tablet 3   Blood Glucose Monitoring Suppl (FIFTY50 GLUCOSE METER 2.0) w/Device KIT USE 1 TEST STRIP TO CHECK GLUCOSE UP TO TWICE DAILY AS INSTRUCTED     Cholecalciferol (VITAMIN D3) 50 MCG (2000 UT) CAPS Take 1 capsule by mouth daily.     CONTOUR NEXT TEST test strip USE 1 STRIP TO CHECK  BLOOD SUGAR TWICE DAILY AS DIRECTED 50 each 0   Evolocumab (REPATHA SURECLICK) 140 MG/ML SOAJ Inject 140 mg into the skin every 14 (fourteen) days. 6 mL 3   folic acid (FOLVITE) 1 MG tablet Take 1 mg by mouth daily.     lisinopril (ZESTRIL) 2.5 MG tablet Take 1 tablet by mouth once daily 90 tablet 2   metFORMIN (GLUCOPHAGE-XR) 500 MG 24 hr tablet TAKE 2 TABLETS BY MOUTH ONCE DAILY WITH BREAKFAST (Patient taking differently: Take 500 mg by  mouth daily with breakfast.) 180 tablet 3   methotrexate (RHEUMATREX) 2.5 MG tablet Take 15 mg by mouth once a week.     Microlet Lancets MISC USE 1 LANCET TO CHECK BLOOD SUGAR TWICE DAILY AS DIRECTED 100 each 4   nitroGLYCERIN (NITROSTAT) 0.4 MG SL tablet Place 1 tablet (0.4 mg total) under the tongue every 5 (five) minutes as needed for chest pain. 25  tablet 1   omega-3 acid ethyl esters (LOVAZA) 1 g capsule Take 1 capsule by mouth twice daily 180 capsule 3   omeprazole (PRILOSEC) 20 MG capsule Take 1 capsule (20 mg total) by mouth daily before breakfast. 90 capsule 3   OZEMPIC, 1 MG/DOSE, 4 MG/3ML SOPN Inject 1 mg as directed once a week. 9 mL 1   predniSONE (DELTASONE) 10 MG tablet Take 10 mg by mouth daily with breakfast.     No current facility-administered medications on file prior to visit.    LABS/IMAGING: No results found for this or any previous visit (from the past 48 hour(s)). No results found.  LIPID PANEL:    Component Value Date/Time   CHOL 270 (H) 04/20/2021 1545   TRIG 283 (H) 04/20/2021 1545   HDL 59 04/20/2021 1545   CHOLHDL 4.6 04/20/2021 1545   CHOLHDL 2.8 05/19/2020 0805   VLDL 18 10/28/2019 0800   LDLCALC 159 (H) 04/20/2021 1545   LDLCALC 68 05/19/2020 0805    WEIGHTS: Wt Readings from Last 3 Encounters:  03/24/23 212 lb 9.6 oz (96.4 kg)  02/14/23 210 lb 12.8 oz (95.6 kg)  10/13/22 216 lb 8 oz (98.2 kg)    VITALS: BP 130/87 (BP Location: Right Arm, Patient Position: Sitting, Cuff Size: Normal)   Pulse 72   Ht 5\' 11"  (1.803 m)   Wt 212 lb 9.6 oz (96.4 kg)   SpO2 98%   BMI 29.65 kg/m   EXAM: Deferred  EKG: Deferred  ASSESSMENT: Coronary artery disease with prior MI, goal LDL less than 55 Statin associated myositis/myopathy Polymyositis Abnormal liver enzymes  PLAN: 1.   Stephen [name] has had a significant response to lipid-lowering therapy with Repatha.  LDL now is at target of 41 with low particle numbers and primarily low small LDL  particle numbers.  Minimally elevated triglycerides which are typically variable as Repatha is not a triglyceride lowering medication.  He has made dietary changes and stays active.  I think this will help reduce his risk of further cardiovascular events.  I would not recommend any changes at this time.  Will repeat lipids in about 6 months.  Chrystie Nose, MD, [facility] FACP  [place]  [facility]  Medical Director of the Advanced Lipid Disorders &  Cardiovascular Risk Reduction Clinic Diplomate of the American Board of Clinical Lipidology Attending Cardiologist  Direct Dial: [phone]  Fax: [phone]  Website:  [email or web address]  03/24/2023, 9:27 AM

## 2023-04-01 ENCOUNTER — Other Ambulatory Visit: Payer: Self-pay | Admitting: Cardiovascular Disease

## 2023-04-01 DIAGNOSIS — E1129 Type 2 diabetes mellitus with other diabetic kidney complication: Secondary | ICD-10-CM

## 2023-04-30 ENCOUNTER — Other Ambulatory Visit: Payer: Self-pay | Admitting: Family Medicine

## 2023-04-30 DIAGNOSIS — E1169 Type 2 diabetes mellitus with other specified complication: Secondary | ICD-10-CM

## 2023-06-08 ENCOUNTER — Ambulatory Visit
Admission: RE | Admit: 2023-06-08 | Discharge: 2023-06-08 | Disposition: A | Discharge status: Home / Self Care | Payer: No Typology Code available for payment source | Source: Ambulatory Visit | Attending: Infectious Diseases | Admitting: Infectious Diseases

## 2023-06-08 ENCOUNTER — Other Ambulatory Visit: Payer: Self-pay | Admitting: Infectious Diseases

## 2023-06-08 DIAGNOSIS — R7611 Nonspecific reaction to tuberculin skin test without active tuberculosis: Secondary | ICD-10-CM

## 2023-06-20 ENCOUNTER — Ambulatory Visit: Payer: 59 | Admitting: Family Medicine

## 2023-06-25 ENCOUNTER — Other Ambulatory Visit: Payer: Self-pay | Admitting: Cardiovascular Disease

## 2023-06-25 DIAGNOSIS — E1129 Type 2 diabetes mellitus with other diabetic kidney complication: Secondary | ICD-10-CM

## 2023-06-26 NOTE — Telephone Encounter (Signed)
last visit 10/13/22 with plan to f/u in  12 months.    next visit:  none/active recall

## 2023-06-27 ENCOUNTER — Encounter: Payer: Self-pay | Admitting: Family Medicine

## 2023-06-27 ENCOUNTER — Other Ambulatory Visit: Payer: Self-pay | Admitting: Family Medicine

## 2023-06-27 ENCOUNTER — Ambulatory Visit (INDEPENDENT_AMBULATORY_CARE_PROVIDER_SITE_OTHER): Payer: 59 | Admitting: Family Medicine

## 2023-06-27 VITALS — BP 128/78 | Ht 71.0 in | Wt 207.0 lb

## 2023-06-27 DIAGNOSIS — R809 Proteinuria, unspecified: Secondary | ICD-10-CM

## 2023-06-27 DIAGNOSIS — Z125 Encounter for screening for malignant neoplasm of prostate: Secondary | ICD-10-CM

## 2023-06-27 DIAGNOSIS — E1129 Type 2 diabetes mellitus with other diabetic kidney complication: Secondary | ICD-10-CM

## 2023-06-27 DIAGNOSIS — M3322 Polymyositis with myopathy: Secondary | ICD-10-CM | POA: Diagnosis not present

## 2023-06-27 DIAGNOSIS — E559 Vitamin D deficiency, unspecified: Secondary | ICD-10-CM

## 2023-06-27 LAB — POCT GLYCOSYLATED HEMOGLOBIN (HGB A1C): Hemoglobin A1C: 8.6 % — AB (ref 4.0–5.6)

## 2023-06-27 MED ORDER — OZEMPIC (2 MG/DOSE) 8 MG/3ML ~~LOC~~ SOPN: 2.0000 mg | PEN_INJECTOR | SUBCUTANEOUS | 5 refills | Status: DC

## 2023-06-27 NOTE — Patient Instructions (Addendum)
Thank you for coming to the office today.  Dose increase Ozempic from 1mg  up to 2mg , new order 30 day + 5 refills.  We will check labs next time A1c on a blood draw + PSA + Vitamin D.  Foot check today, normal  Recommend yearly Urine Protein / Microalbumin check in the future, if possible before end of year if cannot do it today.  DUE for FASTING BLOOD WORK (no food or drink after midnight before the lab appointment, only water or coffee without cream/sugar on the morning of)  SCHEDULE "Lab Only" visit in the morning at the clinic for lab draw in 3 MONTHS   - Make sure Lab Only appointment is at about 1 week before your next appointment, so that results will be available  For Lab Results, once available within 2-3 days of blood draw, you can can log in to MyChart online to view your results and a brief explanation. Also, we can discuss results at next follow-up visit.   Please schedule a Follow-up Appointment to: Return in about 3 months (around 09/27/2023) for 3 month AM non fasting lab then 1 week later Follow-up DM updates (on higher Ozempic dose).  If you have any other questions or concerns, please feel free to call the office or send a message through MyChart. You may also schedule an earlier appointment if necessary.  Additionally, you may be receiving a survey about your experience at our office within a few days to 1 week by e-mail or mail. We value your feedback.  Saralyn Pilar, DO [facility], [place]

## 2023-06-27 NOTE — Assessment & Plan Note (Signed)
A1c elevated to 8.6, attributed to Prednisone usage per Rheumatology He has polymyositis illness Complications - hyperlipidemia, CAD - increases risk of future cardiovascular complications Prednisone has raised his sugar  Plan:  Dose increase Ozempic from 1mg  up to 2mg  weekly inj, new order sent. We considered Mounjaro. May reconsider in future. Right now we discussed CAD cardiovascular prevention on Ozempic has been proven. Continue Metformin XR 500mg  daily  Encourage improved lifestyle - low carb, low sugar diet, reduce portion size, continue improving regular exercise Check CBG, bring log to next visit for review Continue ASA, ACEi DM Foot Exam today Urine micro Future DM Eye Exam

## 2023-06-27 NOTE — Progress Notes (Signed)
Subjective:    Patient ID: Stephen [name], male    DOB: 1975/09/08, 47 y.o.   MRN: [id number]  Stephen [name] is a 47 y.o. male presenting on 06/27/2023 for Medical Management of Chronic Issues and Diabetes   HPI  Discussed the use of AI scribe software for clinical note transcription with the patient, who gave verbal consent to proceed.   The patient, with a history of diabetes, presented for a routine follow-up. He reported no new symptoms or concerns related to his feet, which were examined and found to be in good condition. The patient mentioned that he had recently started a new immune suppression medication, which was prescribed by another provider.  [name] recent A1c result showed an increase from 7.6 to 8.6, which was attributed to fluctuations in prednisone dosing.  The patient also reported having regular monthly metabolic panels done by another provider. He mentioned a recent bone density scan and subsequent vitamin D and calcium supplementation due to a low vitamin D level.  [name] last colonoscopy was a year ago, during which one polyp was found and removed.  The patient also mentioned travel plans and a preference for in-person visits.    CHRONIC DM, Type 2 / BMI >29 A1c elevated today to 8.6% prior range in 7s Attributes higher blood sugar to prednisone course he has been taking per Rheumatology Meds: Ozempic 1mg  weekly inj, Metformin XR 500mg  daily Currently on ACEi - fam history diabetes Next Diabetic Eye Exam end of year or early next year. Denies hypoglycemia, polyuria, visual changes, numbness or tingling.  HYPERLIPIDEMIA / Elevated LFTs / CAD PMH MI x 3 in 2018, followed by Dr Kirke Corin Cardiology - Reports concerns with hyperlipidemia / fatty liver history Remains off Statin, Zetia Followed by Lipid Specialist Cardiology Dr Gean Birchwood on Repatha PCSK9i   Polymyositis with myopathy Followed by [facility] On medication regimen  and prednisone chronic episodic flares. Updated medication with infusion per [facility]. Immunosuppresion  They monitor monthly labs metabolic panel He continues on FMLA Intermittent Leave for Polymyositis flare for recurring infusion therapy apts monthly and episodic flare up of symptoms causing him to be unable to work and miss time. Renew in early 2025.  Health Maintenance:  Last colonoscopy 06/13/22 x 1 polyp, repeat now in 5 years. 2028     06/27/2023    8:30 AM 02/14/2023    3:17 PM 09/27/2021   10:17 AM  Depression screen PHQ 2/9  Decreased Interest 0 0 0  Down, Depressed, Hopeless 0 0 0  PHQ - 2 Score 0 0 0  Altered sleeping  0 0  Tired, decreased energy  0 0  Change in appetite  0 0  Feeling bad or failure about yourself   0 0  Trouble concentrating  0 0  Moving slowly or fidgety/restless  0 0  Suicidal thoughts  0 0  PHQ-9 Score  0 0  Difficult doing work/chores  Not difficult at all Not difficult at all    Social History   Tobacco Use   Smoking status: Former    Current packs/day: 0.00    Average packs/day: 0.1 packs/day for 3.0 years (0.3 ttl pk-yrs)    Types: Cigarettes    Start date: 08/16/1991    Quit date: 08/15/1994    Years since quitting: 28.8   Smokeless tobacco: Never   Tobacco comments:    weekend smoker, pt would only smoke bout 1 -2 cig on the weekend.  Vaping Use   Vaping status: Never Used  Substance Use Topics   Alcohol use: Yes    Comment: on week-ends   Drug use: No    Review of Systems Per HPI unless specifically indicated above     Objective:    BP 128/78   Ht 5\' 11"  (1.803 m)   Wt 207 lb (93.9 kg)   BMI 28.87 kg/m   Wt Readings from Last 3 Encounters:  06/27/23 207 lb (93.9 kg)  03/24/23 212 lb 9.6 oz (96.4 kg)  02/14/23 210 lb 12.8 oz (95.6 kg)    Physical Exam Vitals and nursing note reviewed.  Constitutional:      General: He is not in acute distress.    Appearance: He is well-developed. He is not diaphoretic.      Comments: Well-appearing, comfortable, cooperative  HENT:     Head: Normocephalic and atraumatic.  Eyes:     General:        Right eye: No discharge.        Left eye: No discharge.     Conjunctiva/sclera: Conjunctivae normal.  Neck:     Thyroid: No thyromegaly.  Cardiovascular:     Rate and Rhythm: Normal rate and regular rhythm.     Pulses: Normal pulses.     Heart sounds: Normal heart sounds. No murmur heard. Pulmonary:     Effort: Pulmonary effort is normal. No respiratory distress.     Breath sounds: Normal breath sounds. No wheezing or rales.  Musculoskeletal:        General: Normal range of motion.     Cervical back: Normal range of motion and neck supple.  Lymphadenopathy:     Cervical: No cervical adenopathy.  Skin:    General: Skin is warm and dry.     Findings: No erythema or rash.  Neurological:     Mental Status: He is alert and oriented to person, place, and time. Mental status is at baseline.  Psychiatric:        Behavior: Behavior normal.     Comments: Well groomed, good eye contact, normal speech and thoughts      Diabetic Foot Exam - Simple   Simple Foot Form Diabetic Foot exam was performed with the following findings: Yes 06/27/2023  8:48 AM  Visual Inspection No deformities, no ulcerations, no other skin breakdown bilaterally: Yes Sensation Testing Intact to touch and monofilament testing bilaterally: Yes Pulse Check Posterior Tibialis and Dorsalis pulse intact bilaterally: Yes Comments    Recent Labs    07/27/22 0820 02/14/23 1522 06/27/23 0831  HGBA1C 7.1* 7.6* 8.6*     Results for orders placed or performed in visit on 06/27/23  POCT HgB A1C  Result Value Ref Range   Hemoglobin A1C 8.6 (A) 4.0 - 5.6 %   HbA1c POC (<> result, manual entry)     HbA1c, POC (prediabetic range)     HbA1c, POC (controlled diabetic range)        Assessment & Plan:   Problem List Items Addressed This Visit     Microalbuminuria due to type 2  diabetes mellitus (HCC)    Check urine microalbumin today      Relevant Medications   OZEMPIC, 2 MG/DOSE, 8 MG/3ML SOPN   Polymyositis with myopathy (HCC)    Managed by Rheumatology, [facility] Starting Rituxan infusion, Methotrexate + Prednisone treatments  Note on Intermittent FMLA. He will need to renew this early 2025.      Type 2 diabetes mellitus with  microalbuminuria, without long-term current use of insulin (HCC) - Primary    A1c elevated to 8.6, attributed to Prednisone usage per Rheumatology He has polymyositis illness Complications - hyperlipidemia, CAD - increases risk of future cardiovascular complications Prednisone has raised his sugar  Plan:  Dose increase Ozempic from 1mg  up to 2mg  weekly inj, new order sent. We considered Mounjaro. May reconsider in future. Right now we discussed CAD cardiovascular prevention on Ozempic has been proven. Continue Metformin XR 500mg  daily  Encourage improved lifestyle - low carb, low sugar diet, reduce portion size, continue improving regular exercise Check CBG, bring log to next visit for review Continue ASA, ACEi DM Foot Exam today Urine micro Future DM Eye Exam      Relevant Medications   OZEMPIC, 2 MG/DOSE, 8 MG/3ML SOPN   Other Relevant Orders   POCT HgB A1C (Completed)   Urine microalbumin-creatinine with uACR   Other Visit Diagnoses     Screening for prostate cancer       Vitamin D deficiency            Prostate Cancer Screening No prior PSA testing noted. -Order PSA with next lab draw in 3 months.  Vitamin D Deficiency Previous low levels noted, patient currently on supplementation. -Check Vitamin D level with next lab draw in 3 months.  General Health Maintenance -Next colonoscopy due in 2028. -Continue current cardiac monitoring and cholesterol management with cardiology.      Orders Placed This Encounter  Procedures   Urine microalbumin-creatinine with uACR   POCT HgB A1C       Meds  ordered this encounter  Medications   OZEMPIC, 2 MG/DOSE, 8 MG/3ML SOPN    Sig: Inject 2 mg into the skin once a week.    Dispense:  3 mL    Refill:  5    Dose increase from 1mg  to 2mg     Follow up plan: Return in about 3 months (around 09/27/2023) for 3 month AM non fasting lab then 1 week later Follow-up DM updates (on higher Ozempic dose).  Note he will need Intermittent FMLA Paperwork renewal in Jan/Feb 2025. He can return in mid Feb for his DM follow-up with A1c. However, he does not need to come for both visits. If he is seen for [facility] renewal, we can just do labs only in Feb for his A1c. And discuss on mychart if preferred.  Future labs ordered for 09/27/23 A1c, PSA, Vitamin D   Saralyn Pilar, DO [facility] 06/27/2023, 8:42 AM

## 2023-06-27 NOTE — Assessment & Plan Note (Addendum)
Managed by Rheumatology, [facility] Starting Rituxan infusion, Methotrexate + Prednisone treatments  Note on Intermittent FMLA. He will need to renew this early 2025.

## 2023-06-27 NOTE — Assessment & Plan Note (Signed)
Check urine microalbumin today. 

## 2023-06-28 LAB — MICROALBUMIN / CREATININE URINE RATIO
Creatinine, Urine: 95 mg/dL (ref 20–320)
Microalb Creat Ratio: 21 mg/g{creat} (ref <30)
Microalb, Ur: 2 mg/dL

## 2023-07-27 ENCOUNTER — Other Ambulatory Visit: Payer: Self-pay | Admitting: *Deleted

## 2023-07-27 DIAGNOSIS — E785 Hyperlipidemia, unspecified: Secondary | ICD-10-CM

## 2023-08-07 LAB — HM DIABETES EYE EXAM

## 2023-08-13 ENCOUNTER — Other Ambulatory Visit: Payer: Self-pay | Admitting: Family Medicine

## 2023-08-13 DIAGNOSIS — E1169 Type 2 diabetes mellitus with other specified complication: Secondary | ICD-10-CM

## 2023-08-14 ENCOUNTER — Other Ambulatory Visit: Payer: Self-pay

## 2023-08-14 DIAGNOSIS — E1169 Type 2 diabetes mellitus with other specified complication: Secondary | ICD-10-CM

## 2023-08-14 MED ORDER — OMEGA-3-ACID ETHYL ESTERS 1 G PO CAPS: 1.0000 | ORAL_CAPSULE | Freq: Two times a day (BID) | ORAL | 0 refills | Status: DC

## 2023-08-17 NOTE — Telephone Encounter (Signed)
 Requested Prescriptions  Pending Prescriptions Disp Refills   omega-3 acid ethyl esters (LOVAZA ) 1 g capsule [Pharmacy Med Name: Omega-3-acid  Ethyl Esters 1 GM Oral Capsule] 180 capsule 0    Sig: Take 1 capsule by mouth twice daily     Endocrinology:  Nutritional Agents - omega-3 acid ethyl esters Failed - 08/17/2023  3:28 PM      Failed - Lipid Panel in normal range within the last 12 months    Cholesterol, Total  Date Value Ref Range Status  04/20/2021 270 (H) 100 - 199 mg/dL Final   LDL Cholesterol (Calc)  Date Value Ref Range Status  05/19/2020 68 mg/dL (calc) Final    Comment:    Reference range: <100 . Desirable range <100 mg/dL for primary prevention;   <70 mg/dL for patients with CHD or diabetic patients  with > or = 2 CHD risk factors. SABRA LDL-C is now calculated using the Martin-Hopkins  calculation, which is a validated novel method providing  better accuracy than the Friedewald equation in the  estimation of LDL-C.  Gladis APPLETHWAITE et al. SANDREA. 7986;689(80): 2061-2068  (http://education.[employer].com/faq/FAQ164)    LDL Chol Calc (NIH)  Date Value Ref Range Status  04/20/2021 159 (H) 0 - 99 mg/dL Final   HDL  Date Value Ref Range Status  04/20/2021 59 >39 mg/dL Final   Triglycerides  Date Value Ref Range Status  04/20/2021 283 (H) 0 - 149 mg/dL Final         Passed - Valid encounter within last 12 months    Recent Outpatient Visits           1 month ago Type 2 diabetes mellitus with microalbuminuria, without long-term current use of insulin [facility])   [place] [facility] [place], Marsa PARAS, DO   6 months ago Type 2 diabetes mellitus with microalbuminuria, without long-term current use of insulin [facility])   [place] [facility] [place], Marsa PARAS, DO   1 year ago Type 2 diabetes mellitus with microalbuminuria, without long-term current use of insulin [facility])   [place] [facility] [place],  Marsa PARAS, DO   1 year ago Annual physical exam   [place] [facility] [place] Marsa PARAS, DO   1 year ago Type 2 diabetes mellitus with microalbuminuria, without long-term current use of insulin [facility])   [place] [facility] [place], Marsa PARAS, DO       Future Appointments             In 1 month [place], Marsa PARAS, DO [facility], [facility]

## 2023-09-10 ENCOUNTER — Other Ambulatory Visit: Payer: Self-pay | Admitting: Family Medicine

## 2023-09-10 ENCOUNTER — Other Ambulatory Visit: Payer: Self-pay | Admitting: Internal Medicine

## 2023-09-10 DIAGNOSIS — E1129 Type 2 diabetes mellitus with other diabetic kidney complication: Secondary | ICD-10-CM

## 2023-09-12 ENCOUNTER — Other Ambulatory Visit: Payer: Self-pay

## 2023-09-12 DIAGNOSIS — E1129 Type 2 diabetes mellitus with other diabetic kidney complication: Secondary | ICD-10-CM

## 2023-09-12 NOTE — Telephone Encounter (Signed)
Requested medications are due for refill today.  yes  Requested medications are on the active medications list.  yes  Last refill. 09/20/2022 #180 3 rf  Future visit scheduled.   yes  Notes to clinic.  Labs are expired.     Requested Prescriptions  Pending Prescriptions Disp Refills   metFORMIN (GLUCOPHAGE-XR) 500 MG 24 hr tablet [Pharmacy Med Name: metFORMIN HCl ER 500 MG Oral Tablet Extended Release 24 Hour] 180 tablet 0    Sig: TAKE 2 TABLETS BY MOUTH ONCE DAILY WITH BREAKFAST     Endocrinology:  Diabetes - Biguanides Failed - 09/12/2023  4:13 PM      Failed - HBA1C is between 0 and 7.9 and within 180 days    Hemoglobin A1C  Date Value Ref Range Status  06/27/2023 8.6 (A) 4.0 - 5.6 % Final   Hgb A1c MFr Bld  Date Value Ref Range Status  05/19/2020 6.2 (H) <5.7 % of total Hgb Final    Comment:    For someone without known diabetes, a hemoglobin  A1c value between 5.7% and 6.4% is consistent with prediabetes and should be confirmed with a  follow-up test. . For someone with known diabetes, a value <7% indicates that their diabetes is well controlled. A1c targets should be individualized based on duration of diabetes, age, comorbid conditions, and other considerations. . This assay result is consistent with an increased risk of diabetes. . Currently, no consensus exists regarding use of hemoglobin A1c for diagnosis of diabetes for children. .          Failed - B12 Level in normal range and within 720 days    No results found for: "VITAMINB12"       Failed - CBC within normal limits and completed in the last 12 months    WBC  Date Value Ref Range Status  09/10/2020 5.6 3.4 - 10.8 x10E3/uL Final  05/19/2020 5.2 3.8 - 10.8 Thousand/uL Final   RBC  Date Value Ref Range Status  09/10/2020 5.32 4.14 - 5.80 x10E6/uL Final  05/19/2020 4.95 4.20 - 5.80 Million/uL Final   Hemoglobin  Date Value Ref Range Status  09/10/2020 15.5 13.0 - 17.7 g/dL Final   Hematocrit   Date Value Ref Range Status  09/10/2020 46.0 37.5 - 51.0 % Final   MCHC  Date Value Ref Range Status  09/10/2020 33.7 31.5 - 35.7 g/dL Final  16/05/9603 54.0 32.0 - 36.0 g/dL Final   [facility]  Date Value Ref Range Status  09/10/2020 29.1 26.6 - 33.0 pg Final  05/19/2020 31.3 27.0 - 33.0 pg Final   MCV  Date Value Ref Range Status  09/10/2020 87 79 - 97 fL Final   No results found for: "PLTCOUNTKUC", "LABPLAT", "POCPLA" RDW  Date Value Ref Range Status  09/10/2020 12.3 11.6 - 15.4 % Final         Passed - Cr in normal range and within 360 days    Creatinine  Date Value Ref Range Status  12/21/2022 0.7 0.6 - 1.3 Final   Creat  Date Value Ref Range Status  05/19/2020 0.58 (L) 0.60 - 1.35 mg/dL Final   Creatinine, Ser  Date Value Ref Range Status  09/10/2020 0.51 (L) 0.76 - 1.27 mg/dL Final   Creatinine, Urine  Date Value Ref Range Status  06/27/2023 95 20 - 320 mg/dL Final         Passed - eGFR in normal range and within 360 days    GFR, Est African American  Date Value Ref Range Status  05/19/2020 143 > OR = 60 mL/min/1.32m2 Final   GFR calc Af Amer  Date Value Ref Range Status  09/10/2020 150 >59 mL/min/1.73 Final    Comment:    **In accordance with recommendations from the NKF-ASN Task force,**   [facility] is in the process of updating its eGFR calculation to the   2021 CKD-EPI creatinine equation that estimates kidney function   without a race variable.    GFR, Est Non African American  Date Value Ref Range Status  05/19/2020 123 > OR = 60 mL/min/1.5m2 Final   GFR calc non Af Amer  Date Value Ref Range Status  09/10/2020 130 >59 mL/min/1.73 Final   eGFR  Date Value Ref Range Status  12/21/2022 114  Final         Passed - Valid encounter within last 6 months    Recent Outpatient Visits           2 months ago Type 2 diabetes mellitus with microalbuminuria, without long-term current use of insulin [facility])   [place] [facility]  [place], Netta Neat, DO   7 months ago Type 2 diabetes mellitus with microalbuminuria, without long-term current use of insulin [facility])   [place] [facility] [place], Netta Neat, DO   1 year ago Type 2 diabetes mellitus with microalbuminuria, without long-term current use of insulin [facility])   [place] [facility] Smitty Cords, DO   1 year ago Annual physical exam   [place] [facility] Smitty Cords, DO   1 year ago Type 2 diabetes mellitus with microalbuminuria, without long-term current use of insulin [facility])   [place] [facility] Althea Charon, Netta Neat, DO       Future Appointments             In 3 weeks [place] Charon, Netta Neat, DO [facility] [facility], [facility]

## 2023-09-15 ENCOUNTER — Other Ambulatory Visit: Payer: Self-pay | Admitting: Cardiovascular Disease

## 2023-09-15 DIAGNOSIS — E1129 Type 2 diabetes mellitus with other diabetic kidney complication: Secondary | ICD-10-CM

## 2023-09-26 ENCOUNTER — Other Ambulatory Visit: Payer: 59

## 2023-09-26 DIAGNOSIS — E559 Vitamin D deficiency, unspecified: Secondary | ICD-10-CM

## 2023-09-26 DIAGNOSIS — R809 Proteinuria, unspecified: Secondary | ICD-10-CM

## 2023-09-26 DIAGNOSIS — Z125 Encounter for screening for malignant neoplasm of prostate: Secondary | ICD-10-CM

## 2023-09-27 ENCOUNTER — Other Ambulatory Visit: Payer: Self-pay

## 2023-09-27 LAB — VITAMIN D 25 HYDROXY (VIT D DEFICIENCY, FRACTURES): Vit D, 25-Hydroxy: 72 ng/mL (ref 30–100)

## 2023-09-27 LAB — PSA: PSA: 1.06 ng/mL (ref <=4.00)

## 2023-09-27 LAB — HEMOGLOBIN A1C
Hgb A1c MFr Bld: 7.4 %{Hb} — ABNORMAL HIGH (ref <5.7)
Mean Plasma Glucose: 166 mg/dL
eAG (mmol/L): 9.2 mmol/L

## 2023-09-29 ENCOUNTER — Other Ambulatory Visit: Payer: Self-pay | Admitting: Family Medicine

## 2023-09-29 ENCOUNTER — Ambulatory Visit (INDEPENDENT_AMBULATORY_CARE_PROVIDER_SITE_OTHER): Payer: 59 | Admitting: Family Medicine

## 2023-09-29 ENCOUNTER — Encounter: Payer: Self-pay | Admitting: Family Medicine

## 2023-09-29 VITALS — BP 118/70 | HR 84 | Ht 71.0 in | Wt 211.0 lb

## 2023-09-29 DIAGNOSIS — R809 Proteinuria, unspecified: Secondary | ICD-10-CM

## 2023-09-29 DIAGNOSIS — G5702 Lesion of sciatic nerve, left lower limb: Secondary | ICD-10-CM

## 2023-09-29 DIAGNOSIS — E663 Overweight: Secondary | ICD-10-CM | POA: Diagnosis not present

## 2023-09-29 DIAGNOSIS — Z0000 Encounter for general adult medical examination without abnormal findings: Secondary | ICD-10-CM

## 2023-09-29 DIAGNOSIS — E1129 Type 2 diabetes mellitus with other diabetic kidney complication: Secondary | ICD-10-CM

## 2023-09-29 DIAGNOSIS — M3322 Polymyositis with myopathy: Secondary | ICD-10-CM

## 2023-09-29 DIAGNOSIS — E559 Vitamin D deficiency, unspecified: Secondary | ICD-10-CM

## 2023-09-29 DIAGNOSIS — Z7985 Long-term (current) use of injectable non-insulin antidiabetic drugs: Secondary | ICD-10-CM

## 2023-09-29 DIAGNOSIS — I2510 Atherosclerotic heart disease of native coronary artery without angina pectoris: Secondary | ICD-10-CM

## 2023-09-29 DIAGNOSIS — E1169 Type 2 diabetes mellitus with other specified complication: Secondary | ICD-10-CM

## 2023-09-29 DIAGNOSIS — Z125 Encounter for screening for malignant neoplasm of prostate: Secondary | ICD-10-CM

## 2023-09-29 NOTE — Patient Instructions (Addendum)
Thank you for coming to the office today.  Keep on Ozempic 2mg  weekly as is. Improving A1c. Unfortunately the tug of war with hunger still exists. Weight is stable now.  Recent Labs    02/14/23 1522 06/27/23 0831 09/26/23 0802  HGBA1C 7.6* 8.6* 7.4*     Please schedule a Follow-up Appointment to: Return for 6 month fasting lab > 1 week later Annual Physical.  If you have any other questions or concerns, please feel free to call the office or send a message through MyChart. You may also schedule an earlier appointment if necessary.  Additionally, you may be receiving a survey about your experience at our office within a few days to 1 week by e-mail or mail. We value your feedback.  Saralyn Pilar, DO [facility], [facility]  DUE for FASTING BLOOD WORK (no food or drink after midnight before the lab appointment, only water or coffee without cream/sugar on the morning of)  SCHEDULE "Lab Only" visit in the morning at the clinic for lab draw in 6 MONTHS   - Make sure Lab Only appointment is at about 1 week before your next appointment, so that results will be available  For Lab Results, once available within 2-3 days of blood draw, you can can log in to MyChart online to view your results and a brief explanation. Also, we can discuss results at next follow-up visit.      Piriformis Syndrome Rehabilitation Exercises   You may do all of these exercises right away once acute pain starts to improve on medication or treatment.  Piriformis stretch: Lying on your back with both knees bent, rest the ankle of your injured leg over the knee of your uninjured leg. Grasp the thigh of your uninjured leg and pull that knee toward your chest. You will feel a stretch along the buttocks and possibly along the outside of your hip on the injured side. Hold this for 15 to 30 seconds. Repeat 3 times.  Standing hamstring stretch: Place the heel of your leg on a stool about 15 inches  high. Keep your knee straight. Lean forward, bending at the hips until you feel a mild stretch in the back of your thigh. Make sure you do not roll your shoulders and bend at the waist when doing this or you will stretch your lower back instead. Hold the stretch for 15 to 30 seconds. Repeat 3 times.  Pelvic tilt: Lie on your back with your knees bent and your feet flat on the floor. Tighten your abdominal muscles and push your lower back into the floor. Hold this position for 5 seconds, then relax. Do 3 sets of 10.  Partial curl: Lie on your back with your knees bent and your feet flat on the floor. Tighten your stomach muscles and flatten your back against the floor. Tuck your chin to your chest. With your hands stretched out in front of you, curl your upper body forward until your shoulders clear the floor. Hold this position for 3 seconds. Don't hold your breath. It helps to breathe out as you lift your shoulders up. Relax. Repeat 10 times. Build to 3 sets of 10. To challenge yourself, clasp your hands behind your head and keep your elbows out to the side.  Prone hip extension: Lie on your stomach with your legs straight out behind you. Tighten up your buttocks muscles and lift one leg off the floor about 8 inches. Keep your knee straight. Hold for 5 seconds.  Then lower your leg and relax. Do 3 sets of 10.  If both legs are affected, you may repeat this exercise for the other leg.

## 2023-09-29 NOTE — Progress Notes (Signed)
Subjective:    Patient ID: Stephen [name], male    DOB: 09-20-1975, 48 y.o.   MRN: [id number]  Stephen [name] is a 48 y.o. male presenting on 09/29/2023 for Diabetes   HPI  Discussed the use of AI scribe software for clinical note transcription with the patient, who gave verbal consent to proceed.  History of Present Illness   [name] is a 48 year old male with diabetes who presents for a follow-up regarding medication adjustments and symptom updates.          CHRONIC DM, Type 2 / BMI >29 A1c improved to 7.4 now from 8.6 on higher dose Ozempic Tolerates dose well he has appetite suppression feels some hunger though with prednisone Attributes higher blood sugar to prednisone course he has been taking per Rheumatology now at 30mg  daily Meds: Ozempic 2mg  weekly inj, Metformin XR 500mg  daily Currently on ACEi - fam history diabetes Denies hypoglycemia, polyuria, visual changes, numbness or tingling.   HYPERLIPIDEMIA / Elevated LFTs / CAD PMH MI x 3 in 2018, followed by Dr Kirke Corin Cardiology - Reports concerns with hyperlipidemia / fatty liver history Remains off Statin, Zetia Followed by [facility] Dr Gean Birchwood on Repatha [id number]   Polymyositis with myopathy Followed by [facility] On medication regimen Methotrexate 2.5mg  (15mg  per week) and prednisone chronic episodic flares. - Also on Bactrim 3 times a week w/ immune suppression, Rifampin for latent TB Updated medication Rituxan with infusion per [facility]. Immunosuppresion  They monitor monthly labs metabolic panel He continues on FMLA Intermittent Leave for Polymyositis flare for recurring infusion therapy apts monthly and episodic flare up of symptoms causing him to be unable to work and miss time.  Left Piriformis Syndrome He experiences a 'weird' sensation in his left hip, described as an electrical shock-like feeling in the buttocks region, which occurs when sitting and has been  present for the last three months. This sensation is localized and does not radiate down the leg.   Health Maintenance:   Last colonoscopy 06/13/22 x 1 polyp, repeat now in 5 years. 2028       06/27/2023    8:30 AM 02/14/2023    3:17 PM 09/27/2021   10:17 AM  Depression screen PHQ 2/9  Decreased Interest 0 0 0  Down, Depressed, Hopeless 0 0 0  PHQ - 2 Score 0 0 0  Altered sleeping  0 0  Tired, decreased energy  0 0  Change in appetite  0 0  Feeling bad or failure about yourself   0 0  Trouble concentrating  0 0  Moving slowly or fidgety/restless  0 0  Suicidal thoughts  0 0  PHQ-9 Score  0 0  Difficult doing work/chores  Not difficult at all Not difficult at all       06/27/2023    8:30 AM 02/14/2023    3:18 PM 09/27/2021   10:17 AM 04/16/2021    8:50 AM  GAD 7 : Generalized Anxiety Score  Nervous, Anxious, on Edge 0 0 0 0  Control/stop worrying 0 0 0 0  Worry too much - different things 0 0 0 0  Trouble relaxing 0 0 0 0  Restless 0 0 0 0  Easily annoyed or irritable 0 0 0 0  Afraid - awful might happen 0 0 0 0  Total GAD 7 Score 0 0 0 0  Anxiety Difficulty  Not difficult at all Not difficult at all Not difficult at  all    Social History   Tobacco Use   Smoking status: Former    Current packs/day: 0.00    Average packs/day: 0.1 packs/day for 3.0 years (0.3 ttl pk-yrs)    Types: Cigarettes    Start date: 08/16/1991    Quit date: 08/15/1994    Years since quitting: 29.1   Smokeless tobacco: Never   Tobacco comments:    weekend smoker, pt would only smoke bout 1 -2 cig on the weekend.   Vaping Use   Vaping status: Never Used  Substance Use Topics   Alcohol use: Yes    Comment: on week-ends   Drug use: No    Review of Systems Per HPI unless specifically indicated above     Objective:    BP 118/70   Pulse 84   Ht 5\' 11"  (1.803 m)   Wt 211 lb (95.7 kg)   SpO2 98%   BMI 29.43 kg/m   Wt Readings from Last 3 Encounters:  09/29/23 211 lb (95.7 kg)  06/27/23  207 lb (93.9 kg)  03/24/23 212 lb 9.6 oz (96.4 kg)    Physical Exam Vitals and nursing note reviewed.  Constitutional:      General: He is not in acute distress.    Appearance: Normal appearance. He is well-developed. He is obese. He is not diaphoretic.     Comments: Well-appearing, comfortable, cooperative  HENT:     Head: Normocephalic and atraumatic.  Eyes:     General:        Right eye: No discharge.        Left eye: No discharge.     Conjunctiva/sclera: Conjunctivae normal.  Cardiovascular:     Rate and Rhythm: Normal rate.  Pulmonary:     Effort: Pulmonary effort is normal.  Skin:    General: Skin is warm and dry.     Findings: No erythema or rash.  Neurological:     Mental Status: He is alert and oriented to person, place, and time.  Psychiatric:        Mood and Affect: Mood normal.        Behavior: Behavior normal.        Thought Content: Thought content normal.     Comments: Well groomed, good eye contact, normal speech and thoughts     Results for orders placed or performed in visit on 09/26/23  VITAMIN D 25 Hydroxy (Vit-D Deficiency, Fractures)   Collection Time: 09/26/23  8:02 AM  Result Value Ref Range   Vit D, 25-Hydroxy 72 30 - 100 ng/mL  PSA   Collection Time: 09/26/23  8:02 AM  Result Value Ref Range   PSA 1.06 < OR = 4.00 ng/mL  Hemoglobin A1c   Collection Time: 09/26/23  8:02 AM  Result Value Ref Range   Hgb A1c MFr Bld 7.4 (H) <5.7 % of total Hgb   Mean Plasma Glucose 166 mg/dL   eAG (mmol/L) 9.2 mmol/L      Assessment & Plan:   Problem List Items Addressed This Visit     Overweight (BMI 25.0-29.9)   Polymyositis with myopathy (HCC)   Type 2 diabetes mellitus with microalbuminuria, without long-term current use of insulin (HCC) - Primary   Other Visit Diagnoses       Piriformis syndrome, left            Type 2 Diabetes Mellitus Improved A1C from 8.6 to 7.4 on Ozempic 2mg . Complications - hyperlipidemia, CAD - increases risk of  future  cardiovascular complications  Patient reports feeling full but also hungry, likely due to the effects of Ozempic and concurrent steroid use. -Continue Ozempic 2mg  weekly + Metformin XR 500 daily -Check A1C in 6 months.  Left Piriformis Syndrome suspected Patient reports intermittent, localized pain in left hip region, described as an electrical shock. Likely piriformis syndrome. -Recommend piriformis stretches and heat application.  General Health Maintenance -Consider pneumonia vaccination due to decreased immunity from diabetes and steroid use. -Schedule follow-up appointment in 6 months (August 2025) for routine check and blood panel.  Polymyositis  Followed by Rheumatology -Continue Methotrexate 15mg  weekly (6 tablets), Prednisone 30mg , Bactrim, and Alendronate as prescribed.  -Ensure patient has sufficient refills for Ozempic. Patient to message directly if more refills are needed.         No orders of the defined types were placed in this encounter.   No orders of the defined types were placed in this encounter.   Follow up plan: Return for 6 month fasting lab > 1 week later Annual Physical.  Future labs ordered for 03/26/24   Saralyn Pilar, DO [facility] 09/29/2023, 11:11 AM

## 2023-10-03 ENCOUNTER — Ambulatory Visit: Payer: Self-pay | Admitting: Family Medicine

## 2023-10-16 ENCOUNTER — Encounter: Payer: Self-pay | Admitting: Family Medicine

## 2023-10-16 DIAGNOSIS — Z0279 Encounter for issue of other medical certificate: Secondary | ICD-10-CM

## 2023-11-13 ENCOUNTER — Other Ambulatory Visit: Payer: Self-pay | Admitting: Family Medicine

## 2023-11-13 DIAGNOSIS — E1169 Type 2 diabetes mellitus with other specified complication: Secondary | ICD-10-CM

## 2023-11-15 NOTE — Telephone Encounter (Signed)
 Requested Prescriptions  Pending Prescriptions Disp Refills   omega-3 acid ethyl esters (LOVAZA) 1 g capsule [Pharmacy Med Name: Omega-3-acid Ethyl Esters 1 GM Oral Capsule] 180 capsule 1    Sig: Take 1 capsule by mouth twice daily     Endocrinology:  Nutritional Agents - omega-3 acid ethyl esters Failed - 11/15/2023  9:38 AM      Failed - Lipid Panel in normal range within the last 12 months    Cholesterol, Total  Date Value Ref Range Status  04/20/2021 270 (H) 100 - 199 mg/dL Final   LDL Cholesterol (Calc)  Date Value Ref Range Status  05/19/2020 68 mg/dL (calc) Final    Comment:    Reference range: <100 . Desirable range <100 mg/dL for primary prevention;   <70 mg/dL for patients with CHD or diabetic patients  with > or = 2 CHD risk factors. Marland Kitchen LDL-C is now calculated using the Martin-Hopkins  calculation, which is a validated novel method providing  better accuracy than the Friedewald equation in the  estimation of LDL-C.  Horald Pollen et al. Lenox Ahr. 6045;409(81): 2061-2068  (http://education.[employer].com/faq/FAQ164)    LDL Chol Calc (NIH)  Date Value Ref Range Status  04/20/2021 159 (H) 0 - 99 mg/dL Final   HDL  Date Value Ref Range Status  04/20/2021 59 >39 mg/dL Final   Triglycerides  Date Value Ref Range Status  04/20/2021 283 (H) 0 - 149 mg/dL Final         Passed - Valid encounter within last 12 months    Recent Outpatient Visits           1 month ago Type 2 diabetes mellitus with microalbuminuria, without long-term current use of insulin [facility])   [place] [facility] Christmas, Netta Neat, DO       Future Appointments             In 4 months Althea Charon, Netta Neat, DO [place] [facility], [facility]

## 2023-12-06 ENCOUNTER — Other Ambulatory Visit: Payer: Self-pay | Admitting: Internal Medicine

## 2023-12-06 ENCOUNTER — Other Ambulatory Visit: Payer: Self-pay | Admitting: Cardiovascular Disease

## 2023-12-06 DIAGNOSIS — E1129 Type 2 diabetes mellitus with other diabetic kidney complication: Secondary | ICD-10-CM

## 2024-01-01 ENCOUNTER — Other Ambulatory Visit: Payer: Self-pay | Admitting: Family Medicine

## 2024-01-01 DIAGNOSIS — E1129 Type 2 diabetes mellitus with other diabetic kidney complication: Secondary | ICD-10-CM

## 2024-01-03 NOTE — Telephone Encounter (Signed)
 Requested medication (s) are due for refill today: yes  Requested medication (s) are on the active medication list: yes  Last refill:  06/27/23 3 ml  Future visit scheduled: yes  Notes to clinic:  Cr outside normal range and overdue   Requested Prescriptions  Pending Prescriptions Disp Refills   OZEMPIC , 2 MG/DOSE, 8 MG/3ML SOPN [Pharmacy Med Name: Ozempic  (2 MG/DOSE) 8 MG/3ML Subcutaneous Solution Pen-injector] 3 mL 0    Sig: INJECT 2 MG  SUBCUTANEOUSLY ONCE A WEEK     Endocrinology:  Diabetes - GLP-1 Receptor Agonists - semaglutide  Failed - 01/03/2024  1:26 PM      Failed - HBA1C in normal range and within 180 days    Hgb A1c MFr Bld  Date Value Ref Range Status  09/26/2023 7.4 (H) <5.7 % of total Hgb Final    Comment:    For someone without known diabetes, a hemoglobin A1c value of 6.5% or greater indicates that they may have  diabetes and this should be confirmed with a follow-up  test. . For someone with known diabetes, a value <7% indicates  that their diabetes is well controlled and a value  greater than or equal to 7% indicates suboptimal  control. A1c targets should be individualized based on  duration of diabetes, age, comorbid conditions, and  other considerations. . Currently, no consensus exists regarding use of hemoglobin A1c for diagnosis of diabetes for children. .          Failed - Cr in normal range and within 360 days    Creatinine  Date Value Ref Range Status  12/21/2022 0.7 0.6 - 1.3 Final   Creat  Date Value Ref Range Status  05/19/2020 0.58 (L) 0.60 - 1.35 mg/dL Final   Creatinine, Ser  Date Value Ref Range Status  09/10/2020 0.51 (L) 0.76 - 1.27 mg/dL Final   Creatinine, Urine  Date Value Ref Range Status  06/27/2023 95 20 - 320 mg/dL Final         Passed - Valid encounter within last 6 months    Recent Outpatient Visits           3 months ago Type 2 diabetes mellitus with microalbuminuria, without long-term current use of insulin  [facility])   [place] [facility] Raina Bunting, DO       Future Appointments             In 2 months Romeo Co, Kayleen Party, DO [facility], [facility]

## 2024-01-30 ENCOUNTER — Other Ambulatory Visit: Payer: Self-pay | Admitting: Family Medicine

## 2024-01-30 DIAGNOSIS — R809 Proteinuria, unspecified: Secondary | ICD-10-CM

## 2024-01-30 DIAGNOSIS — E1129 Type 2 diabetes mellitus with other diabetic kidney complication: Secondary | ICD-10-CM

## 2024-02-01 NOTE — Telephone Encounter (Signed)
 Requested medications are due for refill today.  yes  Requested medications are on the active medications list.  yes  Last refill. 09/12/2023 #90 1 rf  Future visit scheduled.   yes  Notes to clinic.  Labs are expired.    Requested Prescriptions  Pending Prescriptions Disp Refills   metFORMIN  (GLUCOPHAGE -XR) 500 MG 24 hr tablet [Pharmacy Med Name: metFORMIN  HCl ER 500 MG Oral Tablet Extended Release 24 Hour] 90 tablet 0    Sig: Take 1 tablet by mouth once daily with breakfast     Endocrinology:  Diabetes - Biguanides Failed - 02/01/2024  4:50 PM      Failed - Cr in normal range and within 360 days    Creatinine  Date Value Ref Range Status  12/21/2022 0.7 0.6 - 1.3 Final   Creat  Date Value Ref Range Status  05/19/2020 0.58 (L) 0.60 - 1.35 mg/dL Final   Creatinine, Ser  Date Value Ref Range Status  09/10/2020 0.51 (L) 0.76 - 1.27 mg/dL Final   Creatinine, Urine  Date Value Ref Range Status  06/27/2023 95 20 - 320 mg/dL Final         Failed - eGFR in normal range and within 360 days    GFR, Est African American  Date Value Ref Range Status  05/19/2020 143 > OR = 60 mL/min/1.26m2 Final   GFR calc Af Amer  Date Value Ref Range Status  09/10/2020 150 >59 mL/min/1.73 Final    Comment:    **In accordance with recommendations from the NKF-ASN Task force,**   [facility] is in the process of updating its eGFR calculation to the   2021 CKD-EPI creatinine equation that estimates kidney function   without a race variable.    GFR, Est Non African American  Date Value Ref Range Status  05/19/2020 123 > OR = 60 mL/min/1.65m2 Final   GFR calc non Af Amer  Date Value Ref Range Status  09/10/2020 130 >59 mL/min/1.73 Final   eGFR  Date Value Ref Range Status  12/21/2022 114  Final         Failed - B12 Level in normal range and within 720 days    No results found for: VITAMINB12       Failed - CBC within normal limits and completed in the last 12 months    WBC  Date  Value Ref Range Status  09/10/2020 5.6 3.4 - 10.8 x10E3/uL Final  05/19/2020 5.2 3.8 - 10.8 Thousand/uL Final   RBC  Date Value Ref Range Status  09/10/2020 5.32 4.14 - 5.80 x10E6/uL Final  05/19/2020 4.95 4.20 - 5.80 Million/uL Final   Hemoglobin  Date Value Ref Range Status  09/10/2020 15.5 13.0 - 17.7 g/dL Final   Hematocrit  Date Value Ref Range Status  09/10/2020 46.0 37.5 - 51.0 % Final   MCHC  Date Value Ref Range Status  09/10/2020 33.7 31.5 - 35.7 g/dL Final  16/05/9603 54.0 32.0 - 36.0 g/dL Final   [facility]  Date Value Ref Range Status  09/10/2020 29.1 26.6 - 33.0 pg Final  05/19/2020 31.3 27.0 - 33.0 pg Final   MCV  Date Value Ref Range Status  09/10/2020 87 79 - 97 fL Final   No results found for: PLTCOUNTKUC, LABPLAT, POCPLA RDW  Date Value Ref Range Status  09/10/2020 12.3 11.6 - 15.4 % Final         Passed - HBA1C is between 0 and 7.9 and within 180 days    Hgb  A1c MFr Bld  Date Value Ref Range Status  09/26/2023 7.4 (H) <5.7 % of total Hgb Final    Comment:    For someone without known diabetes, a hemoglobin A1c value of 6.5% or greater indicates that they may have  diabetes and this should be confirmed with a follow-up  test. . For someone with known diabetes, a value <7% indicates  that their diabetes is well controlled and a value  greater than or equal to 7% indicates suboptimal  control. A1c targets should be individualized based on  duration of diabetes, age, comorbid conditions, and  other considerations. . Currently, no consensus exists regarding use of hemoglobin A1c for diagnosis of diabetes for children. Temple Feeler - Valid encounter within last 6 months    Recent Outpatient Visits           4 months ago Type 2 diabetes mellitus with microalbuminuria, without long-term current use of insulin [facility])   [place] [facility] Romeo Co, Kayleen Party, DO       Future Appointments             In 2  months Romeo Co, Kayleen Party, DO [facility], [facility]

## 2024-02-26 ENCOUNTER — Other Ambulatory Visit: Payer: Self-pay | Admitting: Internal Medicine

## 2024-02-26 NOTE — Progress Notes (Unsigned)
 Cardiology Office Note    Date:  02/27/2024   ID:  Stephen [name], DOB [birth year], MRN [id number]  PCP:  Edman Marsa PARAS, DO  Cardiologist:  Deatrice Cage, MD  Electrophysiologist:  None   Chief Complaint: Follow-up  History of Present Illness:   Stephen [name] is a 48 y.o. male with history of CAD, DM2, and HLD with statin induced myositis, who presents for follow up of CAD.   He underwent nuclear stress testing for unstable angina in 2018 that was abnormal. Echo at that time showed an normal EF. Subsequent LHC in 04/2017 showed significant underlying 2-vessel CAD with subacute thrombotic occlusion of the mid to distal RCA with left-to-right collaterals, occluded OM3 with left-to-left collaterals, significant stenosis of a large OM2, and moderate stenosis of the D2. The LAD was mildly and diffusely diseased. EF was normal. LVEDP was high-normal. He was initially managed medically, but had worsening angina and subsequently underwent PCI/DES to the OM2 in 04/2017. The RCA was left to be managed medically given well developed collaterals. He was seen in the office in 02/2020 and was without angina. It was noted he had elevated LFTs on both Lipitor and Crestor  and had been managed on Zetia  and Vascpea. Follow up LFTs off statins continued to show elevated LFTs and in this setting, Zetia  was stopped, he was referred to GI, and he was started on Repatha .  He developed myalgias and was found to have significant elevated CPK at 17,000.  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  He was treated with prednisone and methotrexate, as well as CellCept (and subsequently failed due to leukopenia).  His muscle strength deteriorated significantly in April and May 2022 and was started on IVIG.  He remains on daily prednisone.  He has had a chronically elevated CK and sed rate since.  He was seen in the office in 04/2021 and continued to be deconditioned.  He reported exertional dyspnea without  chest pain as well as intermittent episodes of tachycardia, particularly after eating.  Given symptoms, he underwent a Zio monitoring which showed a predominant rhythm of sinus with an average rate of 85 bpm (range 50 to 148 bpm), isolated rare PACs and PVCs, and QRS morphology change throughout.  Triggered events did not correlate with significant arrhythmia.  Echo on 06/04/2021 showed an EF of 60 to 65%, no regional wall motion abnormalities, normal LV diastolic function parameters, normal RV systolic function and ventricular cavity size, mild mitral regurgitation, and an estimated right atrial pressure of 8 mmHg.   He has also been referred to the lipid clinic for ongoing management of hyperlipidemia in the context of his significant comorbidities.  He was last seen by general cardiology in 09/2022 and was without symptoms of angina or cardiac decompensation.  He comes in doing well from a cardiac perspective and is without symptoms of angina or cardiac decompensation.  No dizziness, presyncope, or syncope.  No falls.  Most recent labs show CK is downtrending to 471 with a mildly elevated sed rate of 23.  He continues to follow with [facility] and rheumatology.  He feels like his functional status is beginning to improve.  He is now [name] to walk for longer time frames.  Tolerating Repatha  without off target effect.   Labs independently reviewed: 01/2024 - Hgb 14.2, PLT 194, potassium 4.1, BUN 14, serum creatinine 0.7, albumin 4.2, AST 67, ALT 109 09/2023 - A1c 7.4 03/2023 - TC 180, TG 194, HDL 46, LDL  41 03/2022 - LP(a) 23  Past Medical History:  Diagnosis Date   CAD (coronary artery disease)    a. LHC 04/2017: D2 60%, pLCx 30%, OM2 85%, OM3 100%L-L collats, m-dRCA 100% L-R collats, LVEF 55-65%; b. LHC 05/15/2017: D2 60%, pLCx 30%, OM2-1 85% s/p PCI/DES, OM2-2 30%, OM3 100% L-L collats, m-dRCA 100% L-R collats     Diabetes (HCC)    History of echocardiogram    a. 04/2017: EF 55-60%, normal wall  motion, normal LV diastolic function, no significant valvular abnormalities   Hyperlipidemia    Kidney stones 2002, 2008   no stone eval   Myocardial infarction (HCC)    Myositis    associated wirth statins    Past Surgical History:  Procedure Laterality Date   APPENDECTOMY  2000   CARDIAC CATHETERIZATION     CORONARY STENT INTERVENTION N/A 05/15/2017   Procedure: CORONARY STENT INTERVENTION;  Surgeon: Darron Deatrice LABOR, MD;  Location: [facility] INVASIVE CV LAB;  Service: Cardiovascular;  Laterality: N/A;   LEFT HEART CATH AND CORONARY ANGIOGRAPHY Left 04/24/2017   Procedure: LEFT HEART CATH AND CORONARY ANGIOGRAPHY;  Surgeon: Darron Deatrice LABOR, MD;  Location: [facility] INVASIVE CV LAB;  Service: Cardiovascular;  Laterality: Left;    Current Medications: Current Meds  Medication Sig   acetaminophen  (TYLENOL ) 500 MG tablet Take by mouth as needed.   alendronate (FOSAMAX) 35 MG tablet Take 35 mg by mouth once a week.   aspirin  EC 81 MG tablet Take 1 tablet (81 mg total) by mouth daily.   Blood Glucose Monitoring Suppl (FIFTY50 GLUCOSE METER 2.0) w/Device KIT USE 1 TEST STRIP TO CHECK GLUCOSE UP TO TWICE DAILY AS INSTRUCTED   Cholecalciferol (VITAMIN D3) 50 MCG (2000 UT) CAPS Take 1 capsule by mouth daily.   CONTOUR NEXT TEST test strip USE 1 STRIP TO CHECK  BLOOD SUGAR TWICE DAILY AS DIRECTED   Evolocumab  (REPATHA  SURECLICK) 140 MG/ML SOAJ INJECT 1  SUBCUTANEOUSLY EVERY TWO WEEKS   folic acid (FOLVITE) 1 MG tablet Take 1 mg by mouth daily.   metFORMIN  (GLUCOPHAGE -XR) 500 MG 24 hr tablet Take 1 tablet by mouth once daily with breakfast   methotrexate (RHEUMATREX) 2.5 MG tablet Take 15 mg by mouth once a week. 6 tab total per week, per Rheumatology   Microlet Lancets MISC USE 1 LANCET TO CHECK BLOOD SUGAR TWICE DAILY AS DIRECTED   nitroGLYCERIN  (NITROSTAT ) 0.4 MG SL tablet Place 1 tablet (0.4 mg total) under the tongue every 5 (five) minutes as needed for chest pain.   omega-3 acid ethyl esters  (LOVAZA ) 1 g capsule Take 1 capsule by mouth twice daily   omeprazole  (PRILOSEC) 20 MG capsule Take 1 capsule (20 mg total) by mouth daily before breakfast.   OZEMPIC , 2 MG/DOSE, 8 MG/3ML SOPN INJECT 2 MG  SUBCUTANEOUSLY ONCE A WEEK   predniSONE (DELTASONE) 10 MG tablet Take 30 mg by mouth daily with breakfast.   rifampin (RIFADIN) 300 MG capsule Take 300 mg by mouth 2 (two) times daily.   [DISCONTINUED] lisinopril  (ZESTRIL ) 2.5 MG tablet TAKE 1 TABLET BY MOUTH ONCE DAILY . APPOINTMENT REQUIRED FOR FUTURE REFILLS    Allergies:   Statins   Social History   Socioeconomic History   Marital status: Divorced    Spouse name: Not on file   Number of children: Not on file   Years of education: Not on file   Highest education level: Associate degree: academic program  Occupational History   Not on file  Tobacco Use   Smoking status: Former    Current packs/day: 0.00    Average packs/day: 0.1 packs/day for 3.0 years (0.3 ttl pk-yrs)    Types: Cigarettes    Start date: 08/16/1991    Quit date: 08/15/1994    Years since quitting: 29.5   Smokeless tobacco: Never   Tobacco comments:    weekend smoker, pt would only smoke bout 1 -2 cig on the weekend.   Vaping Use   Vaping status: Never Used  Substance and Sexual Activity   Alcohol use: Yes    Comment: on week-ends   Drug use: No   Sexual activity: Yes    Comment: preventing w/ partner contraception  Other Topics Concern   Not on file  Social History Narrative   Not on file   Social Drivers of Health   Financial Resource Strain: Low Risk  (09/25/2023)   Overall Financial Resource Strain (CARDIA)    Difficulty of Paying Living Expenses: Not hard at all  Food Insecurity: No Food Insecurity (09/25/2023)   Hunger Vital Sign    Worried About Running Out of Food in the Last Year: Never true    Ran Out of Food in the Last Year: Never true  Transportation Needs: No Transportation Needs (09/25/2023)   PRAPARE - Scientist, research (physical sciences) (Medical): No    Lack of Transportation (Non-Medical): No  Physical Activity: Inactive (09/25/2023)   Exercise Vital Sign    Days of Exercise per Week: 0 days    Minutes of Exercise per Session: 20 min  Stress: No Stress Concern Present (09/25/2023)   [employer] of Occupational Health - Occupational Stress Questionnaire    Feeling of Stress : Not at all  Social Connections: Moderately Isolated (09/25/2023)   Social Connection and Isolation Panel    Frequency of Communication with Friends and Family: More than three times a week    Frequency of Social Gatherings with Friends and Family: Once a week    Attends Religious Services: Never    Database administrator or Organizations: No    Attends Engineer, structural: Not on file    Marital Status: Living with partner     Family History:  [name] family history includes CAD (age of onset: 20) in his mother; Cancer in his father; Cholecystitis in his father; Chronic Renal Failure in his mother; Diabetes in his father, paternal uncle, and sister; Healthy in his sister, son, and son; Heart disease in his mother; Hyperlipidemia in his mother; Hypertension in his mother; Hyperthyroidism in his sister, sister, and sister; Lung cancer in his paternal aunt; Multiple sclerosis in his sister; Pancreatic cancer in his father and paternal uncle; Stomach cancer in his paternal aunt; Stroke in his paternal uncle; Thyroid disease in his mother. There is no history of Vision loss, Heart attack, Breast cancer, Colon cancer, Prostate cancer, or Esophageal cancer.  ROS:   12-point review of systems is negative unless otherwise noted in the HPI.   EKGs/Labs/Other Studies Reviewed:    Studies reviewed were summarized above. The additional studies were reviewed today:  2D echo 04/2017: - Left ventricle: The cavity size was normal. Wall thickness was    normal. Systolic function was normal. The estimated ejection    fraction was  in the range of 55% to 60%. Wall motion was normal;    there were no regional wall motion abnormalities. Left    ventricular diastolic function parameters were normal.   Impressions:   -  Normal study.  __________   LHC 04/2017: The left ventricular systolic function is normal. LV end diastolic pressure is mildly elevated. The left ventricular ejection fraction is 55-65% by visual estimate. Mid RCA to Dist RCA lesion, 100 %stenosed. Ost 3rd Mrg to 3rd Mrg lesion, 100 %stenosed. 2nd Mrg lesion, 85 %stenosed. Ost 2nd Diag to 2nd Diag lesion, 60 %stenosed. Prox Cx lesion, 30 %stenosed.   1. Significant underlying three-vessel coronary artery disease. The culprit for abnormal stress test as subacute thrombotic occlusion of the mid to distal right coronary artery which now has left-to-right collaterals. OM 3 is also occluded with left to left collaterals. There is significant stenosis in OM 2 and moderate disease in second diagonal. The LAD is mildly and diffusely diseased. 2. Normal LV systolic function. High normal left ventricular end-diastolic pressure.   Recommendations: Recommend initial medical therapy. PCI of OM 2 can be considered for refractory angina. [name] LAD does not have significant obstructive disease to warrant CABG. __________   LHC 05/2017: Mid RCA to Dist RCA lesion, 100 %stenosed. Ost 3rd Mrg to 3rd Mrg lesion, 100 %stenosed. Ost 2nd Diag to 2nd Diag lesion, 60 %stenosed. Prox Cx lesion, 30 %stenosed. 2nd Mrg-2 lesion, 30 %stenosed. 2nd Mrg-1 lesion, 85 %stenosed. Post intervention, there is a 0% residual stenosis. A stent was successfully placed.   Successful angioplasty and drug-eluting stent placement to large OM 2.   Recommendations: Dual antiplatelet therapy for at least 6 months. Aggressive treatment of risk factors. I added small dose Toprol . __________   Zio 04/2021: Patient had a min HR of 50 bpm, max HR of 148 bpm, and avg HR of 85 bpm.   Predominant underlying rhythm was Sinus Rhythm. QRS morphology changes were present throughout recording. Isolated  Rare PACs and rare PVCs. Triggered events did not correlate with arrhythmia. __________   2D echo 06/04/2021: 1. Left ventricular ejection fraction, by estimation, is 60 to 65%. The  left ventricle has normal function. The left ventricle has no regional  wall motion abnormalities. Left ventricular diastolic parameters were  normal.   2. Right ventricular systolic function is normal. The right ventricular  size is normal.   3. The mitral valve is normal in structure. Mild mitral valve  regurgitation. No evidence of mitral stenosis.   4. The aortic valve was not well visualized. Aortic valve regurgitation  is not visualized. No aortic stenosis is present.   5. The inferior vena cava is normal in size with <50% respiratory  variability, suggesting right atrial pressure of 8 mmHg.   EKG:  EKG is ordered today.  The EKG ordered today demonstrates NSR, 69 bpm, LVH, poor R wave progression along the precordial leads, no acute ST-T changes, consistent with prior tracings  Recent Labs: No results found for requested labs within last 365 days.  Recent Lipid Panel    Component Value Date/Time   CHOL 270 (H) 04/20/2021 1545   TRIG 283 (H) 04/20/2021 1545   HDL 59 04/20/2021 1545   CHOLHDL 4.6 04/20/2021 1545   CHOLHDL 2.8 05/19/2020 0805   VLDL 18 10/28/2019 0800   LDLCALC 159 (H) 04/20/2021 1545   LDLCALC 68 05/19/2020 0805    PHYSICAL EXAM:    VS:  BP (!) 130/90 (BP Location: Left Arm, Patient Position: Sitting, Cuff Size: Normal)   Pulse 69   Ht 5' 11 (1.803 m)   Wt 231 lb 12.8 oz (105.1 kg)   SpO2 98%   BMI 32.33 kg/m   BMI:  Body mass index is 32.33 kg/m.  Physical Exam Vitals reviewed.  Constitutional:      Appearance: He is well-developed.  HENT:     Head: Normocephalic and atraumatic.  Eyes:     General:        Right eye: No discharge.        Left  eye: No discharge.  Cardiovascular:     Rate and Rhythm: Normal rate and regular rhythm.     Heart sounds: Normal heart sounds, S1 normal and S2 normal. Heart sounds not distant. No midsystolic click and no opening snap. No murmur heard.    No friction rub.  Pulmonary:     Effort: Pulmonary effort is normal. No respiratory distress.     Breath sounds: Normal breath sounds. No decreased breath sounds, wheezing, rhonchi or rales.  Chest:     Chest wall: No tenderness.  Musculoskeletal:     Cervical back: Normal range of motion.     Right lower leg: No edema.     Left lower leg: No edema.  Skin:    General: Skin is warm and dry.     Nails: There is no clubbing.  Neurological:     Mental Status: He is alert and oriented to person, place, and time.  Psychiatric:        Speech: Speech normal.        Behavior: Behavior normal.        Thought Content: Thought content normal.        Judgment: Judgment normal.     Wt Readings from Last 3 Encounters:  02/27/24 231 lb 12.8 oz (105.1 kg)  09/29/23 211 lb (95.7 kg)  06/27/23 207 lb (93.9 kg)     ASSESSMENT & PLAN:   CAD involving the native coronary arteries without angina: He is doing well and without symptoms concerning for angina.  Continue aggressive risk factor modification and secondary prevention including aspirin  81 mg and Repatha  140 mg injected every 2 weeks.  No indication for further ischemic testing at this time.  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  Not a candidate for statins.  Remains on Repatha  and is followed by the lipid clinic.  Obtain fasting lipid panel today.  Intermittent palpitations and tachycardia: Outpatient monitor unremarkable.  Currently without symptoms.    Disposition: F/u with Dr. Darron or an APP in 12 months.   Medication Adjustments/Labs and Tests Ordered: Current medicines are reviewed at length with the patient today.  Concerns regarding medicines  are outlined above. Medication changes, Labs and Tests ordered today are summarized above and listed in the Patient Instructions accessible in Encounters.   Signed, Bernardino Bring, PA-C 02/27/2024 9:57 AM     [facility] [place] [address] [place], [place] [address] [phone]

## 2024-02-27 ENCOUNTER — Encounter: Payer: Self-pay | Admitting: Physician Assistant

## 2024-02-27 ENCOUNTER — Ambulatory Visit: Attending: Physician Assistant | Admitting: Physician Assistant

## 2024-02-27 VITALS — BP 130/90 | HR 69 | Ht 71.0 in | Wt 231.8 lb

## 2024-02-27 DIAGNOSIS — I2510 Atherosclerotic heart disease of native coronary artery without angina pectoris: Secondary | ICD-10-CM

## 2024-02-27 DIAGNOSIS — R002 Palpitations: Secondary | ICD-10-CM | POA: Diagnosis not present

## 2024-02-27 DIAGNOSIS — E785 Hyperlipidemia, unspecified: Secondary | ICD-10-CM | POA: Diagnosis not present

## 2024-02-27 DIAGNOSIS — M609 Myositis, unspecified: Secondary | ICD-10-CM | POA: Diagnosis not present

## 2024-02-27 DIAGNOSIS — T466X5D Adverse effect of antihyperlipidemic and antiarteriosclerotic drugs, subsequent encounter: Secondary | ICD-10-CM

## 2024-02-27 MED ORDER — LISINOPRIL 2.5 MG PO TABS: 2.5000 mg | ORAL_TABLET | Freq: Every day | ORAL | 11 refills | Status: AC

## 2024-02-27 MED ORDER — LISINOPRIL 2.5 MG PO TABS: 2.5000 mg | ORAL_TABLET | Freq: Every day | ORAL | 3 refills | Status: DC

## 2024-02-27 NOTE — Patient Instructions (Signed)
 Medication Instructions:  Your physician recommends that you continue on your current medications as directed. Please refer to the Current Medication list given to you today.   *If you need a refill on your cardiac medications before your next appointment, please call your pharmacy*  Lab Work: Your provider would like for you to have following labs drawn today Lipid panel.   If you have labs (blood work) drawn today and your tests are completely normal, you will receive your results only by: MyChart Message (if you have MyChart) OR A paper copy in the mail If you have any lab test that is abnormal or we need to change your treatment, we will call you to review the results.  Testing/Procedures: None ordered at this time   Follow-Up: At [facility], you and your health needs are our priority.  As part of our continuing mission to provide you with exceptional heart care, our providers are all part of one team.  This team includes your primary Cardiologist (physician) and Advanced Practice Providers or APPs (Physician Assistants and Nurse Practitioners) who all work together to provide you with the care you need, when you need it.  Your next appointment:   1 year(s)  Provider:   You may see Deatrice Cage, MD or Bernardino Bring, PA-C

## 2024-02-28 LAB — LIPID PANEL
Chol/HDL Ratio: 2.7 ratio (ref 0.0–5.0)
Cholesterol, Total: 156 mg/dL (ref 100–199)
HDL: 58 mg/dL (ref >39)
LDL Chol Calc (NIH): 70 mg/dL (ref 0–99)
Triglycerides: 169 mg/dL — ABNORMAL HIGH (ref 0–149)
VLDL Cholesterol Cal: 28 mg/dL (ref 5–40)

## 2024-02-29 ENCOUNTER — Ambulatory Visit: Payer: Self-pay | Admitting: Physician Assistant

## 2024-03-20 LAB — COMPLETE METABOLIC PANEL WITHOUT GFR
AG Ratio: 0.9 (calc) — ABNORMAL LOW (ref 1.0–2.5)
ALT: 84 U/L — ABNORMAL HIGH (ref 9–46)
AST: 42 U/L — ABNORMAL HIGH (ref 10–40)
Albumin: 3.9 g/dL (ref 3.6–5.1)
Alkaline phosphatase (APISO): 49 U/L (ref 36–130)
BUN: 13 mg/dL (ref 7–25)
CO2: 23 mmol/L (ref 20–32)
Calcium: 9.6 mg/dL (ref 8.6–10.3)
Chloride: 101 mmol/L (ref 98–110)
Creat: 0.7 mg/dL (ref 0.60–1.29)
Globulin: 4.4 g/dL — ABNORMAL HIGH (ref 1.9–3.7)
Glucose, Bld: 228 mg/dL — ABNORMAL HIGH (ref 65–99)
Potassium: 3.7 mmol/L (ref 3.5–5.3)
Sodium: 135 mmol/L (ref 135–146)
Total Bilirubin: 0.4 mg/dL (ref 0.2–1.2)
Total Protein: 8.3 g/dL — ABNORMAL HIGH (ref 6.1–8.1)

## 2024-03-20 LAB — CBC WITH DIFFERENTIAL/PLATELET
Absolute Lymphocytes: 1404 {cells}/uL (ref 850–3900)
Absolute Monocytes: 213 {cells}/uL (ref 200–950)
Basophils Absolute: 9 {cells}/uL (ref 0–200)
Basophils Relative: 0.3 %
Eosinophils Absolute: 21 {cells}/uL (ref 15–500)
Eosinophils Relative: 0.7 %
HCT: 46.1 % (ref 38.5–50.0)
Hemoglobin: 15.1 g/dL (ref 13.2–17.1)
MCH: 31.2 pg (ref 27.0–33.0)
MCHC: 32.8 g/dL (ref 32.0–36.0)
MCV: 95.2 fL (ref 80.0–100.0)
MPV: 10.8 fL (ref 7.5–12.5)
Monocytes Relative: 7.1 %
Neutro Abs: 1353 {cells}/uL — ABNORMAL LOW (ref 1500–7800)
Neutrophils Relative %: 45.1 %
Platelets: 172 Thousand/uL (ref 140–400)
RBC: 4.84 Million/uL (ref 4.20–5.80)
RDW: 14.1 % (ref 11.0–15.0)
Total Lymphocyte: 46.8 %
WBC: 3 Thousand/uL — ABNORMAL LOW (ref 3.8–10.8)

## 2024-03-20 LAB — LIPID PANEL
Cholesterol: 138 mg/dL (ref <200)
HDL: 53 mg/dL (ref >=40)
LDL Cholesterol (Calc): 59 mg/dL
Non-HDL Cholesterol (Calc): 85 mg/dL (ref <130)
Total CHOL/HDL Ratio: 2.6 (calc) (ref <5.0)
Triglycerides: 184 mg/dL — ABNORMAL HIGH (ref <150)

## 2024-03-20 LAB — HEMOGLOBIN A1C
Hgb A1c MFr Bld: 9.3 % — ABNORMAL HIGH (ref <5.7)
Mean Plasma Glucose: 220 mg/dL
eAG (mmol/L): 12.2 mmol/L

## 2024-03-20 LAB — VITAMIN D 25 HYDROXY (VIT D DEFICIENCY, FRACTURES): Vit D, 25-Hydroxy: 59 ng/mL (ref 30–100)

## 2024-03-20 LAB — TSH: TSH: 2.57 m[IU]/L (ref 0.40–4.50)

## 2024-03-20 LAB — PSA: PSA: 0.77 ng/mL (ref <=4.00)

## 2024-03-20 LAB — MICROALBUMIN / CREATININE URINE RATIO
Creatinine, Urine: 23 mg/dL (ref 20–320)
Microalb Creat Ratio: 26 mg/g{creat} (ref <30)
Microalb, Ur: 0.6 mg/dL

## 2024-03-22 ENCOUNTER — Other Ambulatory Visit: Payer: Self-pay | Admitting: Family Medicine

## 2024-03-22 DIAGNOSIS — E1129 Type 2 diabetes mellitus with other diabetic kidney complication: Secondary | ICD-10-CM

## 2024-03-23 ENCOUNTER — Ambulatory Visit: Payer: Self-pay | Admitting: Family Medicine

## 2024-03-26 ENCOUNTER — Other Ambulatory Visit: Payer: 59

## 2024-03-26 DIAGNOSIS — E1169 Type 2 diabetes mellitus with other specified complication: Secondary | ICD-10-CM

## 2024-03-26 DIAGNOSIS — E1129 Type 2 diabetes mellitus with other diabetic kidney complication: Secondary | ICD-10-CM

## 2024-03-26 DIAGNOSIS — Z0000 Encounter for general adult medical examination without abnormal findings: Secondary | ICD-10-CM

## 2024-03-26 DIAGNOSIS — E559 Vitamin D deficiency, unspecified: Secondary | ICD-10-CM

## 2024-03-26 DIAGNOSIS — I2510 Atherosclerotic heart disease of native coronary artery without angina pectoris: Secondary | ICD-10-CM

## 2024-03-26 DIAGNOSIS — Z125 Encounter for screening for malignant neoplasm of prostate: Secondary | ICD-10-CM

## 2024-03-26 NOTE — Telephone Encounter (Signed)
 Requested Prescriptions  Pending Prescriptions Disp Refills   OZEMPIC , 2 MG/DOSE, 8 MG/3ML SOPN [Pharmacy Med Name: Ozempic  (2 MG/DOSE) 8 MG/3ML Subcutaneous Solution Pen-injector] 3 mL 0    Sig: INJECT 2 MG SUBCUTANEOUSLY ONCE WEEKLY     Endocrinology:  Diabetes - GLP-1 Receptor Agonists - semaglutide  Failed - 03/26/2024  3:52 PM      Failed - HBA1C in normal range and within 180 days    Hgb A1c MFr Bld  Date Value Ref Range Status  03/19/2024 9.3 (H) <5.7 % Final    Comment:    For someone without known diabetes, a hemoglobin A1c value of 6.5% or greater indicates that they may have  diabetes and this should be confirmed with a follow-up  test. . For someone with known diabetes, a value <7% indicates  that their diabetes is well controlled and a value  greater than or equal to 7% indicates suboptimal  control. A1c targets should be individualized based on  duration of diabetes, age, comorbid conditions, and  other considerations. . Currently, no consensus exists regarding use of hemoglobin A1c for diagnosis of diabetes for children. .          Passed - Cr in normal range and within 360 days    Creat  Date Value Ref Range Status  03/19/2024 0.70 0.60 - 1.29 mg/dL Final   Creatinine, Urine  Date Value Ref Range Status  03/19/2024 23 20 - 320 mg/dL Final         Passed - Valid encounter within last 6 months    Recent Outpatient Visits           5 months ago Type 2 diabetes mellitus with microalbuminuria, without long-term current use of insulin [facility])   [facility] [place], Marsa PARAS, DO       Future Appointments             In 1 week Edman, Marsa PARAS, DO [facility], [facility]

## 2024-04-01 ENCOUNTER — Ambulatory Visit: Payer: 59 | Admitting: Family Medicine

## 2024-04-03 ENCOUNTER — Encounter: Payer: Self-pay | Admitting: Family Medicine

## 2024-04-03 ENCOUNTER — Ambulatory Visit (INDEPENDENT_AMBULATORY_CARE_PROVIDER_SITE_OTHER): Admitting: Family Medicine

## 2024-04-03 VITALS — BP 110/70 | HR 77 | Ht 71.0 in | Wt 212.0 lb

## 2024-04-03 DIAGNOSIS — R809 Proteinuria, unspecified: Secondary | ICD-10-CM

## 2024-04-03 DIAGNOSIS — Z0000 Encounter for general adult medical examination without abnormal findings: Secondary | ICD-10-CM | POA: Diagnosis not present

## 2024-04-03 DIAGNOSIS — E1129 Type 2 diabetes mellitus with other diabetic kidney complication: Secondary | ICD-10-CM | POA: Diagnosis not present

## 2024-04-03 DIAGNOSIS — E663 Overweight: Secondary | ICD-10-CM

## 2024-04-03 DIAGNOSIS — E1169 Type 2 diabetes mellitus with other specified complication: Secondary | ICD-10-CM

## 2024-04-03 DIAGNOSIS — M3322 Polymyositis with myopathy: Secondary | ICD-10-CM

## 2024-04-03 DIAGNOSIS — E785 Hyperlipidemia, unspecified: Secondary | ICD-10-CM

## 2024-04-03 DIAGNOSIS — Z7985 Long-term (current) use of injectable non-insulin antidiabetic drugs: Secondary | ICD-10-CM | POA: Diagnosis not present

## 2024-04-03 DIAGNOSIS — I2510 Atherosclerotic heart disease of native coronary artery without angina pectoris: Secondary | ICD-10-CM

## 2024-04-03 MED ORDER — METFORMIN HCL ER 500 MG PO TB24: 1000.0000 mg | ORAL_TABLET | Freq: Every day | ORAL | 11 refills | Status: AC

## 2024-04-03 MED ORDER — MOUNJARO 7.5 MG/0.5ML ~~LOC~~ SOAJ: 7.5000 mg | SUBCUTANEOUS | 0 refills | Status: DC

## 2024-04-03 MED ORDER — OMEGA-3-ACID ETHYL ESTERS 1 G PO CAPS: 1.0000 | ORAL_CAPSULE | Freq: Two times a day (BID) | ORAL | 11 refills | Status: AC

## 2024-04-03 NOTE — Progress Notes (Signed)
 Subjective:    Patient ID: Stephen [name], male    DOB: Aug 12, 1976, 48 y.o.   MRN: [id number]  Stephen [name] is a 48 y.o. male presenting on 04/03/2024 for Annual Exam   HPI  Discussed the use of AI scribe software for clinical note transcription with the patient, who gave verbal consent to proceed.  History of Present Illness   Stephen [name] is a 48 year old male with diabetes who presents for an annual physical exam.   CHRONIC DM, Type 2 / BMI >29 A1c escalated to 9.3 from 7.4 Continues on Prednisone currently. Now on lower dose Metformin  Interested in switch Ozempic  Meds: Ozempic  2mg  weekly inj, Metformin  XR 500mg  daily Currently on ACEi - fam history diabetes DM Eye exam December 2025 Denies hypoglycemia, polyuria, visual changes, numbness or tingling.   HYPERLIPIDEMIA / Elevated LFTs / CAD PMH MI x 3 in 2018, followed by Dr Darron Cardiology - Reports concerns with hyperlipidemia / fatty liver history Remains off Statin, Zetia  Followed by Lipid Specialist Cardiology Dr Mona Solo on Repatha  [id number]   Polymyositis with myopathy Followed by [facility] On medication regimen Methotrexate 2.5mg  (15mg  per week) and prednisone chronic episodic flares. - Also on Bactrim 3 times a week w/ immune suppression, Rifampin for latent TB He continues on FMLA Intermittent Leave for Polymyositis flare for recurring infusion therapy apts monthly and episodic flare up of symptoms causing him to be unable to work and miss time.     Health Maintenance:   Last colonoscopy 06/13/22 x 1 polyp, repeat now in 5 years. 2028  - Received pneumonia vaccine in 2018 due to diabetes Consider Prevnar-20 at next visit  Consider COVID Booster      06/27/2023    8:30 AM 02/14/2023    3:17 PM 09/27/2021   10:17 AM  Depression screen PHQ 2/9  Decreased Interest 0 0 0  Down, Depressed, Hopeless 0 0 0  PHQ - 2 Score 0 0 0  Altered sleeping  0 0  Tired, decreased energy  0 0  Change  in appetite  0 0  Feeling bad or failure about yourself   0 0  Trouble concentrating  0 0  Moving slowly or fidgety/restless  0 0  Suicidal thoughts  0 0  PHQ-9 Score  0 0  Difficult doing work/chores  Not difficult at all Not difficult at all       06/27/2023    8:30 AM 02/14/2023    3:18 PM 09/27/2021   10:17 AM 04/16/2021    8:50 AM  GAD 7 : Generalized Anxiety Score  Nervous, Anxious, on Edge 0 0 0 0  Control/stop worrying 0 0 0 0  Worry too much - different things 0 0 0 0  Trouble relaxing 0 0 0 0  Restless 0 0 0 0  Easily annoyed or irritable 0 0 0 0  Afraid - awful might happen 0 0 0 0  Total GAD 7 Score 0 0 0 0  Anxiety Difficulty  Not difficult at all Not difficult at all Not difficult at all     Past Medical History:  Diagnosis Date   CAD (coronary artery disease)    a. LHC 04/2017: D2 60%, pLCx 30%, OM2 85%, OM3 100%L-L collats, m-dRCA 100% L-R collats, LVEF 55-65%; b. LHC 05/15/2017: D2 60%, pLCx 30%, OM2-1 85% s/p PCI/DES, OM2-2 30%, OM3 100% L-L collats, m-dRCA 100% L-R collats     Diabetes (HCC)    History of  echocardiogram    a. 04/2017: EF 55-60%, normal wall motion, normal LV diastolic function, no significant valvular abnormalities   Hyperlipidemia    Kidney stones 2002, 2008   no stone eval   Myocardial infarction (HCC)    Myositis    associated wirth statins   Past Surgical History:  Procedure Laterality Date   APPENDECTOMY  2000   CARDIAC CATHETERIZATION     CORONARY STENT INTERVENTION N/A 05/15/2017   Procedure: CORONARY STENT INTERVENTION;  Surgeon: Darron Deatrice LABOR, MD;  Location: [facility];  Service: Cardiovascular;  Laterality: N/A;   LEFT HEART CATH AND CORONARY ANGIOGRAPHY Left 04/24/2017   Procedure: LEFT HEART CATH AND CORONARY ANGIOGRAPHY;  Surgeon: Darron Deatrice LABOR, MD;  Location: [facility];  Service: Cardiovascular;  Laterality: Left;   Social History   Socioeconomic History   Marital status: Divorced    Spouse  name: Not on file   Number of children: Not on file   Years of education: Not on file   Highest education level: Associate degree: academic program  Occupational History   Not on file  Tobacco Use   Smoking status: Former    Current packs/day: 0.00    Average packs/day: 0.1 packs/day for 3.0 years (0.3 ttl pk-yrs)    Types: Cigarettes    Start date: 08/16/1991    Quit date: 08/15/1994    Years since quitting: 29.6   Smokeless tobacco: Never   Tobacco comments:    weekend smoker, pt would only smoke bout 1 -2 cig on the weekend.   Vaping Use   Vaping status: Never Used  Substance and Sexual Activity   Alcohol use: Yes    Comment: on week-ends   Drug use: No   Sexual activity: Yes    Comment: preventing w/ partner contraception  Other Topics Concern   Not on file  Social History Narrative   Not on file   Social Drivers of Health   Financial Resource Strain: Low Risk  (04/02/2024)   Overall Financial Resource Strain (CARDIA)    Difficulty of Paying Living Expenses: Not hard at all  Food Insecurity: Food Insecurity Present (04/02/2024)   Hunger Vital Sign    Worried About Running Out of Food in the Last Year: Never true    Ran Out of Food in the Last Year: Often true  Transportation Needs: No Transportation Needs (04/02/2024)   PRAPARE - Administrator, Civil Service (Medical): No    Lack of Transportation (Non-Medical): No  Physical Activity: Inactive (04/02/2024)   Exercise Vital Sign    Days of Exercise per Week: 0 days    Minutes of Exercise per Session: Not on file  Stress: No Stress Concern Present (04/02/2024)   [employer] of Occupational Health - Occupational Stress Questionnaire    Feeling of Stress: Not at all  Social Connections: Moderately Isolated (04/02/2024)   Social Connection and Isolation Panel    Frequency of Communication with Friends and Family: Twice a week    Frequency of Social Gatherings with Friends and Family: Once a week    Attends  Religious Services: Never    Database administrator or Organizations: No    Attends Engineer, structural: Not on file    Marital Status: Living with partner  Intimate Partner Violence: Not on file   Family History  Problem Relation Age of Onset   Heart disease Mother    Thyroid disease Mother    CAD Mother  72   Hypertension Mother    Hyperlipidemia Mother    Chronic Renal Failure Mother    Cancer Father        pancreatic s/p Whipple procedure   Diabetes Father    Cholecystitis Father    Pancreatic cancer Father    Hyperthyroidism Sister    Lung cancer Paternal Aunt    Diabetes Paternal Uncle    Stroke Paternal Uncle    Multiple sclerosis Sister    Hyperthyroidism Sister    Hyperthyroidism Sister    Diabetes Sister    Healthy Sister    Healthy Son    Healthy Son    Pancreatic cancer Paternal Uncle    Stomach cancer Paternal Aunt    Vision loss Neg Hx    Heart attack Neg Hx    Breast cancer Neg Hx    Colon cancer Neg Hx    Prostate cancer Neg Hx    Esophageal cancer Neg Hx    Current Outpatient Medications on File Prior to Visit  Medication Sig   acetaminophen  (TYLENOL ) 500 MG tablet Take by mouth as needed.   alendronate (FOSAMAX) 35 MG tablet Take 35 mg by mouth once a week.   aspirin  EC 81 MG tablet Take 1 tablet (81 mg total) by mouth daily.   Blood Glucose Monitoring Suppl (FIFTY50 GLUCOSE METER 2.0) w/Device KIT USE 1 TEST STRIP TO CHECK GLUCOSE UP TO TWICE DAILY AS INSTRUCTED   Cholecalciferol (VITAMIN D3) 50 MCG (2000 UT) CAPS Take 1 capsule by mouth daily.   CONTOUR NEXT TEST test strip USE 1 STRIP TO CHECK  BLOOD SUGAR TWICE DAILY AS DIRECTED   Evolocumab  (REPATHA  SURECLICK) 140 MG/ML SOAJ INJECT 1  SUBCUTANEOUSLY EVERY TWO WEEKS   folic acid (FOLVITE) 1 MG tablet Take 1 mg by mouth daily.   lisinopril  (ZESTRIL ) 2.5 MG tablet Take 1 tablet (2.5 mg total) by mouth daily.   methotrexate (RHEUMATREX) 2.5 MG tablet Take 15 mg by mouth once a week.    Microlet Lancets MISC USE 1 LANCET TO CHECK BLOOD SUGAR TWICE DAILY AS DIRECTED   nitroGLYCERIN  (NITROSTAT ) 0.4 MG SL tablet Place 1 tablet (0.4 mg total) under the tongue every 5 (five) minutes as needed for chest pain.   omeprazole  (PRILOSEC) 20 MG capsule Take 1 capsule (20 mg total) by mouth daily before breakfast.   predniSONE (DELTASONE) 10 MG tablet Take 30 mg by mouth daily with breakfast.   rifampin (RIFADIN) 300 MG capsule Take 300 mg by mouth 2 (two) times daily.   sulfamethoxazole-trimethoprim (BACTRIM DS) 800-160 MG tablet Take 1 tablet by mouth 3 (three) times a week.   No current facility-administered medications on file prior to visit.    Review of Systems  Constitutional:  Negative for activity change, appetite change, chills, diaphoresis, fatigue and fever.  HENT:  Negative for congestion and hearing loss.   Eyes:  Negative for visual disturbance.  Respiratory:  Negative for cough, chest tightness, shortness of breath and wheezing.   Cardiovascular:  Negative for chest pain, palpitations and leg swelling.  Gastrointestinal:  Negative for abdominal pain, constipation, diarrhea, nausea and vomiting.  Genitourinary:  Negative for dysuria, frequency and hematuria.  Musculoskeletal:  Negative for arthralgias and neck pain.  Skin:  Negative for rash.  Neurological:  Negative for dizziness, weakness, light-headedness, numbness and headaches.  Hematological:  Negative for adenopathy.  Psychiatric/Behavioral:  Negative for behavioral problems, dysphoric mood and sleep disturbance.    Per HPI unless specifically indicated above  Objective:    BP 110/70 (BP Location: Right Arm, Patient Position: Sitting, Cuff Size: Normal)   Pulse 77   Ht 5' 11 (1.803 m)   Wt 212 lb (96.2 kg)   SpO2 97%   BMI 29.57 kg/m   Wt Readings from Last 3 Encounters:  04/03/24 212 lb (96.2 kg)  02/27/24 231 lb 12.8 oz (105.1 kg)  09/29/23 211 lb (95.7 kg)    Physical Exam Vitals and nursing  note reviewed.  Constitutional:      General: He is not in acute distress.    Appearance: He is well-developed. He is obese. He is not diaphoretic.     Comments: Well-appearing, comfortable, cooperative  HENT:     Head: Normocephalic and atraumatic.  Eyes:     General:        Right eye: No discharge.        Left eye: No discharge.     Conjunctiva/sclera: Conjunctivae normal.     Pupils: Pupils are equal, round, and reactive to light.  Neck:     Thyroid: No thyromegaly.     Vascular: No carotid bruit.  Cardiovascular:     Rate and Rhythm: Normal rate and regular rhythm.     Pulses: Normal pulses.     Heart sounds: Normal heart sounds. No murmur heard. Pulmonary:     Effort: Pulmonary effort is normal. No respiratory distress.     Breath sounds: Normal breath sounds. No wheezing or rales.  Abdominal:     General: Bowel sounds are normal. There is no distension.     Palpations: Abdomen is soft. There is no mass.     Tenderness: There is no abdominal tenderness.  Musculoskeletal:        General: No tenderness. Normal range of motion.     Cervical back: Normal range of motion and neck supple.     Right lower leg: No edema.     Left lower leg: No edema.     Comments: Upper / Lower Extremities: - Normal muscle tone, strength bilateral upper extremities 5/5, lower extremities 5/5  Lymphadenopathy:     Cervical: No cervical adenopathy.  Skin:    General: Skin is warm and dry.     Findings: No erythema or rash.  Neurological:     Mental Status: He is alert and oriented to person, place, and time.     Comments: Distal sensation intact to light touch all extremities  Psychiatric:        Mood and Affect: Mood normal.        Behavior: Behavior normal.        Thought Content: Thought content normal.     Comments: Well groomed, good eye contact, normal speech and thoughts     Results for orders placed or performed in visit on 03/26/24  VITAMIN D  25 Hydroxy (Vit-D Deficiency,  Fractures)   Collection Time: 03/19/24  8:42 AM  Result Value Ref Range   Vit D, 25-Hydroxy 59 30 - 100 ng/mL  TSH   Collection Time: 03/19/24  8:42 AM  Result Value Ref Range   TSH 2.57 0.40 - 4.50 mIU/L  Microalbumin / creatinine urine ratio   Collection Time: 03/19/24  8:42 AM  Result Value Ref Range   Creatinine, Urine 23 20 - 320 mg/dL   Microalb, Ur 0.6 mg/dL   Microalb Creat Ratio 26 <30 mg/g creat  PSA   Collection Time: 03/19/24  8:42 AM  Result Value Ref Range   PSA 0.77 <  OR = 4.00 ng/mL  CBC with Differential/Platelet   Collection Time: 03/19/24  8:42 AM  Result Value Ref Range   WBC 3.0 (L) 3.8 - 10.8 Thousand/uL   RBC 4.84 4.20 - 5.80 Million/uL   Hemoglobin 15.1 13.2 - 17.1 g/dL   HCT 53.8 61.4 - 49.9 %   MCV 95.2 80.0 - 100.0 fL   MCH 31.2 27.0 - 33.0 pg   MCHC 32.8 32.0 - 36.0 g/dL   RDW 85.8 88.9 - 84.9 %   Platelets 172 140 - 400 Thousand/uL   MPV 10.8 7.5 - 12.5 fL   Neutro Abs 1,353 (L) 1,500 - 7,800 cells/uL   Absolute Lymphocytes 1,404 850 - 3,900 cells/uL   Absolute Monocytes 213 200 - 950 cells/uL   Eosinophils Absolute 21 15 - 500 cells/uL   Basophils Absolute 9 0 - 200 cells/uL   Neutrophils Relative % 45.1 %   Total Lymphocyte 46.8 %   Monocytes Relative 7.1 %   Eosinophils Relative 0.7 %   Basophils Relative 0.3 %  COMPLETE METABOLIC PANEL WITH GFR   Collection Time: 03/19/24  8:42 AM  Result Value Ref Range   Glucose, Bld 228 (H) 65 - 99 mg/dL   BUN 13 7 - 25 mg/dL   Creat 9.29 9.39 - 8.70 mg/dL   BUN/Creatinine Ratio SEE NOTE: 6 - 22 (calc)   Sodium 135 135 - 146 mmol/L   Potassium 3.7 3.5 - 5.3 mmol/L   Chloride 101 98 - 110 mmol/L   CO2 23 20 - 32 mmol/L   Calcium  9.6 8.6 - 10.3 mg/dL   Total Protein 8.3 (H) 6.1 - 8.1 g/dL   Albumin 3.9 3.6 - 5.1 g/dL   Globulin 4.4 (H) 1.9 - 3.7 g/dL (calc)   AG Ratio 0.9 (L) 1.0 - 2.5 (calc)   Total Bilirubin 0.4 0.2 - 1.2 mg/dL   Alkaline phosphatase (APISO) 49 36 - 130 U/L   AST 42 (H) 10  - 40 U/L   ALT 84 (H) 9 - 46 U/L  Hemoglobin A1c   Collection Time: 03/19/24  8:42 AM  Result Value Ref Range   Hgb A1c MFr Bld 9.3 (H) <5.7 %   Mean Plasma Glucose 220 mg/dL   eAG (mmol/L) 87.7 mmol/L  Lipid panel   Collection Time: 03/19/24  8:42 AM  Result Value Ref Range   Cholesterol 138 <200 mg/dL   HDL 53 > OR = 40 mg/dL   Triglycerides 815 (H) <150 mg/dL   LDL Cholesterol (Calc) 59 mg/dL (calc)   Total CHOL/HDL Ratio 2.6 <5.0 (calc)   Non-HDL Cholesterol (Calc) 85 <869 mg/dL (calc)      Assessment & Plan:   Problem List Items Addressed This Visit     Coronary artery disease   Relevant Medications   omega-3 acid ethyl esters (LOVAZA ) 1 g capsule   Hyperlipidemia associated with type 2 diabetes mellitus (HCC)   Relevant Medications   MOUNJARO  7.5 MG/0.5ML Pen   metFORMIN  (GLUCOPHAGE -XR) 500 MG 24 hr tablet   omega-3 acid ethyl esters (LOVAZA ) 1 g capsule   Overweight (BMI 25.0-29.9)   Polymyositis with myopathy (HCC)   Type 2 diabetes mellitus with microalbuminuria, without long-term current use of insulin (HCC)   Relevant Medications   MOUNJARO  7.5 MG/0.5ML Pen   metFORMIN  (GLUCOPHAGE -XR) 500 MG 24 hr tablet   Other Visit Diagnoses       Annual physical exam    -  Primary     Long-term current use  of injectable noninsulin antidiabetic medication            Updated Health Maintenance information Reviewed recent lab results with patient Encouraged improvement to lifestyle with diet and exercise Goal of weight loss  Type 2 diabetes mellitus A1c increased to 9.3%. prior range 7-9 Current regimen may not be effective. Note he is on concomitant Prednisone course for autoimmune condition Limited weight loss on Ozempic  2mg . We discussed cardiac benefits, with known CAD. Not proven on Mounjaro , however we agree that goal is to control his A1c and Diabetes better right now. Also we did reduce his Metformin  last time and perhaps this raised A1c as well.  - Switch  from Ozempic  2 mg to Mounjaro  7.5 mg, potential increase to 10 mg after one month based on response. - Increase metformin  XR 500mg  from 1 to 2 tab daily - Recheck A1c in three months. - He will contact provider within 3-4 weeks for potential Mounjaro  dose increase to 10 mg.  Adult Wellness Visit Discussed updated pneumonia vaccine guidelines. Eligible for Prevnar 20 booster. Higher risk for COVID due to prednisone use. Discussed shingles vaccine for age 77+. - Defer Prevnar 20 booster until next year. - Consider COVID booster based on risk assessment. - Consider shingles vaccine after age 19.  Hyperlipidemia On Repatha  for HLD and cardiovascular protection.  General Health Maintenance Reviewed general health maintenance. Eye checkup scheduled. Colon screening up to date until 2028. - Schedule eye checkup for December. - Ensure colon screening is up to date.       No orders of the defined types were placed in this encounter.   Meds ordered this encounter  Medications   MOUNJARO  7.5 MG/0.5ML Pen    Sig: Inject 7.5 mg into the skin once a week.    Dispense:  2 mL    Refill:  0    Switch from Ozempic  2mg  over to Mounjaro  7.5mg    metFORMIN  (GLUCOPHAGE -XR) 500 MG 24 hr tablet    Sig: Take 2 tablets (1,000 mg total) by mouth daily with breakfast.    Dispense:  60 tablet    Refill:  11   omega-3 acid ethyl esters (LOVAZA ) 1 g capsule    Sig: Take 1 capsule (1 g total) by mouth 2 (two) times daily.    Dispense:  60 capsule    Refill:  11    Add refills     Follow up plan: Return in about 3 months (around 07/04/2024) for 3 month DM A1c.  Marsa Officer, DO [facility] 04/03/2024, 3:33 PM

## 2024-04-03 NOTE — Patient Instructions (Addendum)
 Thank you for coming to the office today.  Prevnar-20 next year, pneumonia vaccine booster.  Switch from Ozempic  2mg  to Mounjaro  7.5mg  new order, stay tuned for approval  Contact me within 3-4 weeks of the Mounjaro  and we can dose increase to 10mg .  Other refills in  Eye Exam in Winter.  A1c next time.  Please schedule a Follow-up Appointment to: Return in about 3 months (around 07/04/2024) for 3 month DM A1c.  If you have any other questions or concerns, please feel free to call the office or send a message through MyChart. You may also schedule an earlier appointment if necessary.  Additionally, you may be receiving a survey about your experience at our office within a few days to 1 week by e-mail or mail. We value your feedback.  Marsa Officer, DO [facility], [place]

## 2024-05-19 ENCOUNTER — Encounter: Payer: Self-pay | Admitting: Family Medicine

## 2024-05-19 DIAGNOSIS — E1129 Type 2 diabetes mellitus with other diabetic kidney complication: Secondary | ICD-10-CM

## 2024-05-20 MED ORDER — MOUNJARO 10 MG/0.5ML ~~LOC~~ SOAJ: 10.0000 mg | SUBCUTANEOUS | 0 refills | Status: DC

## 2024-06-26 ENCOUNTER — Encounter: Payer: Self-pay | Admitting: Family Medicine

## 2024-06-26 ENCOUNTER — Ambulatory Visit (INDEPENDENT_AMBULATORY_CARE_PROVIDER_SITE_OTHER): Admitting: Family Medicine

## 2024-06-26 VITALS — BP 110/76 | HR 74 | Ht 71.0 in | Wt 206.5 lb

## 2024-06-26 DIAGNOSIS — Z7985 Long-term (current) use of injectable non-insulin antidiabetic drugs: Secondary | ICD-10-CM

## 2024-06-26 DIAGNOSIS — E1129 Type 2 diabetes mellitus with other diabetic kidney complication: Secondary | ICD-10-CM

## 2024-06-26 DIAGNOSIS — E785 Hyperlipidemia, unspecified: Secondary | ICD-10-CM

## 2024-06-26 DIAGNOSIS — I2510 Atherosclerotic heart disease of native coronary artery without angina pectoris: Secondary | ICD-10-CM

## 2024-06-26 DIAGNOSIS — E1169 Type 2 diabetes mellitus with other specified complication: Secondary | ICD-10-CM | POA: Diagnosis not present

## 2024-06-26 DIAGNOSIS — R809 Proteinuria, unspecified: Secondary | ICD-10-CM

## 2024-06-26 LAB — POCT GLYCOSYLATED HEMOGLOBIN (HGB A1C): Hemoglobin A1C: 9.1 % — AB (ref 4.0–5.6)

## 2024-06-26 MED ORDER — MOUNJARO 10 MG/0.5ML ~~LOC~~ SOAJ: 10.0000 mg | SUBCUTANEOUS | 3 refills | Status: AC

## 2024-06-26 NOTE — Progress Notes (Signed)
 Subjective:    Patient ID: Stephen [name], male    DOB: February 23, 1976, 48 y.o.   MRN: [id number]  Stephen [name] is a 48 y.o. male presenting on 06/26/2024 for Diabetes   HPI  Discussed the use of AI scribe software for clinical note transcription with the patient, who gave verbal consent to proceed.  History of Present Illness   Dawson Albers is a 48 year old male with type 2 diabetes who presents for follow-up on his diabetes management.   CHRONIC DM, Type 2 / BMI >28 3 months ago switch from Ozempic  2mg  over to Mounjaro  7.5mg , then dose inc 10 mg starting in October 2025, out of past 6 doses, 2 doses were missed. weight down 6 lbs in 3 months, 212 to 206 lbs Meds: Mounjaro  10mg  weekly inj, Metformin  XR 500mg  x 2 = 1000mg  daily Currently on ACEi - fam history diabetes DM Eye exam December 2025 Denies hypoglycemia, polyuria, visual changes, numbness or tingling.   HYPERLIPIDEMIA / Elevated LFTs / CAD PMH MI x 3 in 2018, followed by Dr Darron Cardiology - Reports concerns with hyperlipidemia / fatty liver history Remains off Statin, Zetia  Followed by [facility] Dr Mona Solo on Repatha  [id number]   Polymyositis with myopathy Followed by [facility] On medication regimen Methotrexate 2.5mg  (15mg  per week) and prednisone chronic episodic flares. - Also on Bactrim 3 times a week w/ immune suppression, Rifampin for latent TB He continues on FMLA Intermittent Leave for Polymyositis flare for recurring infusion therapy apts monthly and episodic flare up of symptoms causing him to be unable to work and miss time.     Health Maintenance:   Last colonoscopy 06/13/22 x 1 polyp, repeat now in 5 years. 2028   - Received pneumonia vaccine in 2018 due to diabetes Consider Prevnar-20 at next time age 41+       06/26/2024   10:16 AM 06/27/2023    8:30 AM 02/14/2023    3:17 PM  Depression screen PHQ 2/9  Decreased Interest 0 0 0  Down, Depressed, Hopeless 0  0 0  PHQ - 2 Score 0 0 0  Altered sleeping   0  Tired, decreased energy   0  Change in appetite   0  Feeling bad or failure about yourself    0  Trouble concentrating   0  Moving slowly or fidgety/restless   0  Suicidal thoughts   0  PHQ-9 Score   0   Difficult doing work/chores   Not difficult at all     Data saved with a previous flowsheet row definition       06/26/2024   10:16 AM 06/27/2023    8:30 AM 02/14/2023    3:18 PM 09/27/2021   10:17 AM  GAD 7 : Generalized Anxiety Score  Nervous, Anxious, on Edge 0 0 0 0  Control/stop worrying 0 0 0 0  Worry too much - different things 0 0 0 0  Trouble relaxing 0 0 0 0  Restless 0 0 0 0  Easily annoyed or irritable 0 0 0 0  Afraid - awful might happen 0 0 0 0  Total GAD 7 Score 0 0 0 0  Anxiety Difficulty   Not difficult at all Not difficult at all    Social History   Tobacco Use   Smoking status: Former    Current packs/day: 0.00    Average packs/day: 0.1 packs/day for 3.0 years (0.3 ttl pk-yrs)  Types: Cigarettes    Start date: 08/16/1991    Quit date: 08/15/1994    Years since quitting: 29.8   Smokeless tobacco: Never   Tobacco comments:    weekend smoker, pt would only smoke bout 1 -2 cig on the weekend.   Vaping Use   Vaping status: Never Used  Substance Use Topics   Alcohol use: Yes    Comment: on week-ends   Drug use: No    Review of Systems Per HPI unless specifically indicated above     Objective:    BP 110/76 (BP Location: Right Arm, Patient Position: Sitting, Cuff Size: Normal)   Pulse 74   Ht 5' 11 (1.803 m)   Wt 206 lb 8 oz (93.7 kg)   SpO2 96%   BMI 28.80 kg/m   Wt Readings from Last 3 Encounters:  06/26/24 206 lb 8 oz (93.7 kg)  04/03/24 212 lb (96.2 kg)  02/27/24 231 lb 12.8 oz (105.1 kg)    Physical Exam Vitals and nursing note reviewed.  Constitutional:      General: He is not in acute distress.    Appearance: He is well-developed. He is not diaphoretic.     Comments:  Well-appearing, comfortable, cooperative  HENT:     Head: Normocephalic and atraumatic.  Eyes:     General:        Right eye: No discharge.        Left eye: No discharge.     Conjunctiva/sclera: Conjunctivae normal.  Neck:     Thyroid: No thyromegaly.  Cardiovascular:     Rate and Rhythm: Normal rate and regular rhythm.     Pulses: Normal pulses.     Heart sounds: Normal heart sounds. No murmur heard. Pulmonary:     Effort: Pulmonary effort is normal. No respiratory distress.     Breath sounds: Normal breath sounds. No wheezing or rales.  Musculoskeletal:        General: Normal range of motion.     Cervical back: Normal range of motion and neck supple.  Lymphadenopathy:     Cervical: No cervical adenopathy.  Skin:    General: Skin is warm and dry.     Findings: No erythema or rash.  Neurological:     Mental Status: He is alert and oriented to person, place, and time. Mental status is at baseline.  Psychiatric:        Behavior: Behavior normal.     Comments: Well groomed, good eye contact, normal speech and thoughts     Diabetic Foot Exam - Simple   Simple Foot Form Diabetic Foot exam was performed with the following findings: Yes 06/26/2024 10:47 AM  Visual Inspection No deformities, no ulcerations, no other skin breakdown bilaterally: Yes Sensation Testing Intact to touch and monofilament testing bilaterally: Yes Pulse Check Posterior Tibialis and Dorsalis pulse intact bilaterally: Yes Comments      Results for orders placed or performed in visit on 06/26/24  POCT HgB A1C   Collection Time: 06/26/24 10:22 AM  Result Value Ref Range   Hemoglobin A1C 9.1 (A) 4.0 - 5.6 %   HbA1c POC (<> result, manual entry)     HbA1c, POC (prediabetic range)     HbA1c, POC (controlled diabetic range)        Assessment & Plan:   Problem List Items Addressed This Visit     Coronary artery disease   Hyperlipidemia associated with type 2 diabetes mellitus (HCC)   Relevant  Medications  MOUNJARO  10 MG/0.5ML Pen   Type 2 diabetes mellitus with microalbuminuria, without long-term current use of insulin (HCC) - Primary   Relevant Medications   MOUNJARO  10 MG/0.5ML Pen   Other Relevant Orders   POCT HgB A1C (Completed)   Other Visit Diagnoses       Long-term current use of injectable noninsulin antidiabetic medication            Type 2 diabetes mellitus with other specified complication Type 2 diabetes managed with Mounjaro . A1c improved from 9.3% to 9.1%. Weight decreased from 212 lbs to 206 lbs. Missed doses during travel, resumed medication. Current dosage 10 mg, increased from 7.5 mg in October. Tolerating well, no significant side effects. Further evaluation for dosage adjustment needed. - Continue Mounjaro  10 mg once weekly. - Follow-up A1c test in four months. - Provided prescription for Mounjaro  with a three-month supply. DM Foot DM Eye 07/2024  General Health Maintenance Pneumonia vaccination updated to age 74 and older. Vision check scheduled for December 23rd. - Administer updated pneumonia vaccine at age 65. - Review vision check results from December 23rd.        Orders Placed This Encounter  Procedures   POCT HgB A1C    Meds ordered this encounter  Medications   MOUNJARO  10 MG/0.5ML Pen    Sig: Inject 10 mg into the skin once a week.    Dispense:  2 mL    Refill:  3    Follow up plan: Return in about 4 months (around 10/24/2024) for 4 month DM A1c - any day, early AM.    Marsa Officer, DO [facility] 06/26/2024, 10:36 AM

## 2024-06-26 NOTE — Patient Instructions (Addendum)
 Thank you for coming to the office today.  Recent Labs    09/26/23 0802 03/19/24 0842 06/26/24 1022  HGBA1C 7.4* 9.3* 9.1*   Future Age 49+ Physical we can do the Prevnar-20  Please schedule a Follow-up Appointment to: Return in about 4 months (around 10/24/2024) for 4 month DM A1c - any day, early AM.  If you have any other questions or concerns, please feel free to call the office or send a message through MyChart. You may also schedule an earlier appointment if necessary.  Additionally, you may be receiving a survey about your experience at our office within a few days to 1 week by e-mail or mail. We value your feedback.  Marsa Officer, DO [facility], [place]

## 2024-07-08 ENCOUNTER — Ambulatory Visit: Admitting: Family Medicine

## 2024-08-20 LAB — OPHTHALMOLOGY REPORT-SCANNED

## 2024-10-24 ENCOUNTER — Ambulatory Visit: Admitting: Family Medicine
